# Patient Record
Sex: Male | Born: 1940 | Hispanic: No | Smoking: Never smoker
Health system: Southern US, Community
[De-identification: ages and names within clinical notes are randomized; demographics above are authoritative.]

## PROBLEM LIST (undated history)

## (undated) DIAGNOSIS — F0390 Unspecified dementia without behavioral disturbance: Secondary | ICD-10-CM

## (undated) DIAGNOSIS — N289 Disorder of kidney and ureter, unspecified: Secondary | ICD-10-CM

## (undated) HISTORY — PX: OTHER SURGICAL HISTORY: SHX169

---

## 2016-12-01 ENCOUNTER — Other Ambulatory Visit: Payer: Self-pay | Admitting: Family Medicine

## 2016-12-01 DIAGNOSIS — R413 Other amnesia: Secondary | ICD-10-CM

## 2018-11-02 ENCOUNTER — Emergency Department (HOSPITAL_COMMUNITY): Payer: Medicare Other

## 2018-11-02 ENCOUNTER — Other Ambulatory Visit: Payer: Self-pay

## 2018-11-02 ENCOUNTER — Encounter (HOSPITAL_COMMUNITY): Payer: Self-pay

## 2018-11-02 ENCOUNTER — Inpatient Hospital Stay (HOSPITAL_COMMUNITY)
Admission: EM | Admit: 2018-11-02 | Discharge: 2018-11-07 | DRG: 418 | Disposition: A | Discharge status: Home / Self Care | Payer: Medicare Other | Attending: Surgery | Admitting: Surgery

## 2018-11-02 DIAGNOSIS — K819 Cholecystitis, unspecified: Secondary | ICD-10-CM

## 2018-11-02 DIAGNOSIS — R1011 Right upper quadrant pain: Secondary | ICD-10-CM

## 2018-11-02 DIAGNOSIS — K8000 Calculus of gallbladder with acute cholecystitis without obstruction: Secondary | ICD-10-CM | POA: Diagnosis not present

## 2018-11-02 DIAGNOSIS — K8010 Calculus of gallbladder with chronic cholecystitis without obstruction: Secondary | ICD-10-CM | POA: Diagnosis present

## 2018-11-02 DIAGNOSIS — R109 Unspecified abdominal pain: Secondary | ICD-10-CM

## 2018-11-02 DIAGNOSIS — R945 Abnormal results of liver function studies: Secondary | ICD-10-CM

## 2018-11-02 DIAGNOSIS — K810 Acute cholecystitis: Secondary | ICD-10-CM | POA: Diagnosis not present

## 2018-11-02 DIAGNOSIS — Z720 Tobacco use: Secondary | ICD-10-CM

## 2018-11-02 DIAGNOSIS — R7989 Other specified abnormal findings of blood chemistry: Secondary | ICD-10-CM

## 2018-11-02 DIAGNOSIS — F0390 Unspecified dementia without behavioral disturbance: Secondary | ICD-10-CM | POA: Diagnosis present

## 2018-11-02 DIAGNOSIS — N179 Acute kidney failure, unspecified: Secondary | ICD-10-CM | POA: Diagnosis present

## 2018-11-02 DIAGNOSIS — R339 Retention of urine, unspecified: Secondary | ICD-10-CM | POA: Diagnosis present

## 2018-11-02 HISTORY — DX: Disorder of kidney and ureter, unspecified: N28.9

## 2018-11-02 HISTORY — DX: Unspecified dementia, unspecified severity, without behavioral disturbance, psychotic disturbance, mood disturbance, and anxiety: F03.90

## 2018-11-02 LAB — COMPREHENSIVE METABOLIC PANEL
ALT: 172 U/L — AB (ref 0–44)
AST: 124 U/L — ABNORMAL HIGH (ref 15–41)
Albumin: 4 g/dL (ref 3.5–5.0)
Alkaline Phosphatase: 226 U/L — ABNORMAL HIGH (ref 38–126)
Anion gap: 11 (ref 5–15)
BUN: 20 mg/dL (ref 8–23)
CO2: 21 mmol/L — AB (ref 22–32)
Calcium: 9.1 mg/dL (ref 8.9–10.3)
Chloride: 103 mmol/L (ref 98–111)
Creatinine, Ser: 1.18 mg/dL (ref 0.61–1.24)
GFR calc Af Amer: >60 mL/min (ref >60)
GFR calc non Af Amer: 59 mL/min — ABNORMAL LOW (ref >60)
Glucose, Bld: 144 mg/dL — ABNORMAL HIGH (ref 70–99)
Potassium: 3.8 mmol/L (ref 3.5–5.1)
SODIUM: 135 mmol/L (ref 135–145)
Total Bilirubin: 3.7 mg/dL — ABNORMAL HIGH (ref 0.3–1.2)
Total Protein: 7.3 g/dL (ref 6.5–8.1)

## 2018-11-02 LAB — URINALYSIS, ROUTINE W REFLEX MICROSCOPIC
Bilirubin Urine: NEGATIVE
GLUCOSE, UA: 50 mg/dL — AB
Ketones, ur: NEGATIVE mg/dL
LEUKOCYTE UA: NEGATIVE
Nitrite: NEGATIVE
Protein, ur: 30 mg/dL — AB
Specific Gravity, Urine: 1.026 (ref 1.005–1.030)
pH: 5 (ref 5.0–8.0)

## 2018-11-02 LAB — CBC
HCT: 45.8 % (ref 39.0–52.0)
Hemoglobin: 16.3 g/dL (ref 13.0–17.0)
MCH: 33.4 pg (ref 26.0–34.0)
MCHC: 35.6 g/dL (ref 30.0–36.0)
MCV: 93.9 fL (ref 80.0–100.0)
Platelets: 162 10*3/uL (ref 150–400)
RBC: 4.88 MIL/uL (ref 4.22–5.81)
RDW: 13.7 % (ref 11.5–15.5)
WBC: 16.8 10*3/uL — ABNORMAL HIGH (ref 4.0–10.5)
nRBC: 0 % (ref 0.0–0.2)

## 2018-11-02 LAB — LIPASE, BLOOD: LIPASE: 23 U/L (ref 11–51)

## 2018-11-02 MED ORDER — IOHEXOL 300 MG/ML  SOLN
100.0000 mL | Freq: Once | INTRAMUSCULAR | Status: AC | PRN
  Administered 2018-11-02: 100 mL via INTRAVENOUS

## 2018-11-02 MED ORDER — SODIUM CHLORIDE 0.9% FLUSH
3.0000 mL | Freq: Once | INTRAVENOUS | Status: AC
  Administered 2018-11-03: 3 mL via INTRAVENOUS

## 2018-11-02 NOTE — H&P (Signed)
William Bishop is an 78 y.o. male.   Chief Complaint: abdominal pain HPI: The patient is a 78 year old white male who presents to the emergency department with complaints of abdominal pain.  The family says this is been going on for several days.  The family states that he has run a fever to 101.4 at home.  He has had several episodes of nausea and vomiting.  He complains of right upper quadrant pain.  He came to the emergency department where an ultrasound did show stones in the gallbladder as well as some gallbladder wall thickening.  His liver functions were significantly elevated.  He states that he has never had surgery before  Past Medical History:  Diagnosis Date  . Dementia (HCC)   . Renal disorder     Past Surgical History:  Procedure Laterality Date  . arm fracture surgery      Family History  Family history unknown: Yes   Social History:  reports that he has never smoked. His smokeless tobacco use includes chew. He reports that he does not drink alcohol or use drugs.  Allergies: No Known Allergies  (Not in a hospital admission)   Results for orders placed or performed during the hospital encounter of 11/02/18 (from the past 48 hour(s))  Urinalysis, Routine w reflex microscopic     Status: Abnormal   Collection Time: 11/02/18  7:32 PM  Result Value Ref Range   Color, Urine AMBER (A) YELLOW    Comment: BIOCHEMICALS MAY BE AFFECTED BY COLOR   APPearance CLEAR CLEAR   Specific Gravity, Urine 1.026 1.005 - 1.030   pH 5.0 5.0 - 8.0   Glucose, UA 50 (A) NEGATIVE mg/dL   Hgb urine dipstick SMALL (A) NEGATIVE   Bilirubin Urine NEGATIVE NEGATIVE   Ketones, ur NEGATIVE NEGATIVE mg/dL   Protein, ur 30 (A) NEGATIVE mg/dL   Nitrite NEGATIVE NEGATIVE   Leukocytes,Ua NEGATIVE NEGATIVE    Comment: Performed at [facility], [address] [place], [place] [address]  Lipase, blood     Status: None   Collection Time: 11/02/18  8:14 PM  Result Value Ref Range   Lipase 23 11 - 51 U/L    Comment: Performed at [facility], [address], [place], [place] [address]  Comprehensive metabolic panel     Status: Abnormal   Collection Time: 11/02/18  8:14 PM  Result Value Ref Range   Sodium 135 135 - 145 mmol/L   Potassium 3.8 3.5 - 5.1 mmol/L   Chloride 103 98 - 111 mmol/L   CO2 21 (L) 22 - 32 mmol/L   Glucose, Bld 144 (H) 70 - 99 mg/dL   BUN 20 8 - 23 mg/dL   Creatinine, Ser 6.28 0.61 - 1.24 mg/dL   Calcium 9.1 8.9 - 31.5 mg/dL   Total Protein 7.3 6.5 - 8.1 g/dL   Albumin 4.0 3.5 - 5.0 g/dL   AST 176 (H) 15 - 41 U/L   ALT 172 (H) 0 - 44 U/L   Alkaline Phosphatase 226 (H) 38 - 126 U/L   Total Bilirubin 3.7 (H) 0.3 - 1.2 mg/dL   GFR calc non Af Amer 59 (L) >60 mL/min   GFR calc Af Amer >60 >60 mL/min   Anion gap 11 5 - 15    Comment: Performed at [facility], [address], [place], [place] [address]  CBC     Status: Abnormal   Collection Time: 11/02/18  8:14 PM  Result Value Ref Range   WBC 16.8 (H) 4.0 - 10.5 K/uL   RBC 4.88 4.22 - 5.81 MIL/uL   Hemoglobin 16.3 13.0 - 17.0 g/dL   HCT 24.8 18.5 - 90.9 %   MCV 93.9 80.0 - 100.0 fL   MCH 33.4 26.0 - 34.0 pg   MCHC 35.6 30.0 - 36.0 g/dL   RDW 31.1 21.6 - 24.4 %   Platelets 162 150 - 400 K/uL   nRBC 0.0 0.0 - 0.2 %    Comment: Performed at [facility], [address], [place], [place] [address]   Ct Abdomen Pelvis W Contrast  Result Date: 11/02/2018 CLINICAL DATA:  Right upper quadrant abdominal pain. EXAM: CT ABDOMEN AND PELVIS WITH CONTRAST TECHNIQUE: Multidetector CT imaging of the abdomen and pelvis was performed using the standard protocol following bolus administration of intravenous contrast. CONTRAST:  OMNIPAQUE IOHEXOL 300 MG/ML  SOLN COMPARISON:  None. FINDINGS: Lower chest: Subpleural 7 mm nodule in the right middle lobe, image 12 series 4. 5 mm perifissural subpleural nodule in the included right upper lobe image 4  series 4. 5 mm subpleural nodule in the right lower lobe, image 17 series 4. motion artifact partially limits evaluation of the lung bases. There is dependent atelectasis. Questionable mild bilateral pleural thickening. Mild cardiomegaly. Hepatobiliary: Distended gallbladder with indistinct wall and pericholecystic stranding. No calcified gallstone. No biliary dilatation. Punctate hepatic granuloma in the right lobe. No focal lesion. Pancreas: No ductal dilatation or inflammation. Spleen: Normal in size without focal abnormality. Adrenals/Urinary Tract: Normal adrenal glands. No hydronephrosis. Trace bilateral perinephric edema appears symmetric. Multiple clustered nonobstructing stones in the lower right kidney measuring 11 mm in total. Multiple bilateral renal cysts. Ureters are decompressed without stone along the course. Urinary bladder is partially distended. Mild bladder wall thickening. Stomach/Bowel: Small hiatal hernia. Stomach physiologically distended. Small duodenal diverticulum. No small bowel inflammation or obstruction. Mild edema about the hepatic flexure of the colon felt to be secondary to gallbladder inflammation, no associated colonic wall thickening. Mild diverticulosis of the distal colon without acute diverticulitis. Appendix not definitively visualized. Vascular/Lymphatic: Aortic atherosclerosis and tortuosity. No aortic aneurysm. No enlarged lymph nodes in the abdomen or pelvis. Portal vein and mesenteric vessels are patent. Reproductive: Enlarged prostate gland spanning 5.9 cm transverse. Other: Pericholecystic stranding with trace fluid. No ascites elsewhere. No free air. No intra-abdominal abscess. Small fat containing umbilical hernia. Minimal fat in the inguinal canals. Musculoskeletal: Degenerative change in the lumbar spine. There are no acute or suspicious osseous abnormalities. IMPRESSION: 1. Findings suspicious for acute cholecystitis. No calcified gallstone. 2. Nonobstructing  right nephrolithiasis. Bilateral renal cysts. 3. Enlarged prostate gland. Mild bladder wall thickening likely secondary to chronic bladder outlet obstruction. 4. Multiple pulmonary nodules at the right lung base, largest measuring 7 mm. Non-contrast chest CT at 3-6 months is recommended. If the nodules are stable at time of repeat CT, then future CT at 18-24 months (from today's scan) is considered optional for low-risk patients, but is recommended for high-risk patients. This recommendation follows the consensus statement: Guidelines for Management of Incidental Pulmonary Nodules Detected on CT Images: From the Fleischner Society 2017; Radiology 2017; 284:228-243. 5.  Aortic Atherosclerosis (ICD10-I70.0). Electronically Signed   By: Narda Rutherford M.D.   On: 11/02/2018 22:49    Review of Systems  Constitutional: Positive for fever.  HENT: Negative.   Eyes: Negative.   Respiratory: Negative.   Cardiovascular: Negative.   Gastrointestinal: Positive for abdominal pain, nausea and vomiting.  Genitourinary: Negative.   Musculoskeletal: Negative.   Skin: Negative.   Neurological: Negative.   Endo/Heme/Allergies: Negative.   Psychiatric/Behavioral: Positive for memory loss.    Blood pressure (!) 145/73, pulse 91, temperature 98.1 F (36.7 C), temperature source Oral, resp. rate 18, height 5\' 7"  (1.702 m), weight 97.5 kg, SpO2 93 %. Physical Exam  Constitutional: He appears well-developed and well-nourished. No distress.  HENT:  Head: Normocephalic and atraumatic.  Mouth/Throat: No oropharyngeal exudate.  Eyes: Pupils are equal, round, and reactive to light. Conjunctivae and EOM are normal.  Neck: Normal range of motion. Neck supple.  Cardiovascular: Normal rate, regular rhythm and normal heart sounds.  Respiratory: Effort normal and breath sounds normal. No stridor. No respiratory distress.  GI: Soft.  There is moderate tenderness in the RUQ  Musculoskeletal: Normal range of motion.         General: No tenderness or edema.  Neurological: He is alert.  Answers questions appropriately although family says there is some dementia  Skin: Skin is warm and dry. No erythema.  Psychiatric: He has a normal mood and affect. His behavior is normal. Thought content normal.     Assessment/Plan The patient appears to have cholecystitis with cholelithiasis and some significant elevation of his liver function test.  At this point I would plan to admit him to the hospital and start him on broad-spectrum antibiotic therapy.  We will start him on bowel rest as well.  I will recheck his liver functions in the morning.  If they are still elevated then we will consult gastroenterology for possible ERCP.  I talked to him and his family about possible MRCP and they do not feel he will sit still long enough for the study.  He may benefit from removal of the gallbladder during this hospitalization  Chevis PrettyPaul Toth III, MD 11/02/2018, 11:57 PM

## 2018-11-02 NOTE — ED Triage Notes (Signed)
Patient c/o RUQ pain 2 days ago, no pain yesterday, but today he began having mid and RUQ pain with fever today.

## 2018-11-02 NOTE — ED Provider Notes (Signed)
Emergency Department Provider Note   I have reviewed the triage vital signs and the nursing notes.   HISTORY  Chief Complaint Abdominal Pain   HPI William Bishop is a 78 y.o. male with PMH of dementia presents to the ED with RUQ abdominal pain and fever. No CP, SOB, or cough.  The patient's family member provides most of the history with the patient's dementia.  Level 5 caveat applies.  Symptoms began 2 days ago and have gradually worsened.  She measured a temperature of 101 F yesterday evening.  No vomiting or diarrhea.  Patient states she is unsure if he may have fallen during the start of this as he had been out in the barn and then returned complaining of abdominal pain and wet jeans.   Past Medical History:  Diagnosis Date   Dementia (HCC)    Renal disorder     There are no active problems to display for this patient.   Past Surgical History:  Procedure Laterality Date   arm fracture surgery      Allergies Patient has no known allergies.  Family History  Family history unknown: Yes    Social History Social History   Tobacco Use   Smoking status: Never Smoker   Smokeless tobacco: Current User    Types: Chew  Substance Use Topics   Alcohol use: Never    Frequency: Never   Drug use: Never    Review of Systems  Constitutional: Positive fever/chills Eyes: No visual changes. ENT: No sore throat. Cardiovascular: Denies chest pain. Respiratory: Denies shortness of breath. Gastrointestinal: Positive RUQ abdominal pain.  No nausea, no vomiting.  No diarrhea.  No constipation. Genitourinary: Negative for dysuria. Musculoskeletal: Negative for back pain. Skin: Negative for rash. Neurological: Negative for headaches, focal weakness or numbness.  10-point ROS otherwise negative.  ____________________________________________   PHYSICAL EXAM:  VITAL SIGNS: ED Triage Vitals  Enc Vitals Group     BP 11/02/18 1656 126/89     Pulse Rate 11/02/18  1656 (!) 118     Resp 11/02/18 1656 20     Temp 11/02/18 1656 98.1 F (36.7 C)     Temp Source 11/02/18 1656 Oral     SpO2 11/02/18 1656 98 %     Weight 11/02/18 1705 215 lb (97.5 kg)     Height 11/02/18 1705 5\' 7"  (1.702 m)     Pain Score 11/02/18 1703 5   Constitutional: Alert and oriented. Well appearing and in no acute distress. Eyes: Conjunctivae are normal. Head: Atraumatic. Nose: No congestion/rhinnorhea. Mouth/Throat: Mucous membranes are moist.  Neck: No stridor.  Cardiovascular: Tachycardia. Good peripheral circulation. Grossly normal heart sounds.   Respiratory: Normal respiratory effort. No retractions. Lungs CTAB. Gastrointestinal: Soft with focal RUQ tenderness. No rebound or guarding. No distention.  Musculoskeletal: No lower extremity tenderness nor edema. No gross deformities of extremities. Neurologic:  Normal speech and language. No gross focal neurologic deficits are appreciated.  Skin:  Skin is warm, dry and intact. No rash noted.  ____________________________________________   LABS (all labs ordered are listed, but only abnormal results are displayed)  Labs Reviewed  COMPREHENSIVE METABOLIC PANEL - Abnormal; Notable for the following components:      Result Value   CO2 21 (*)    Glucose, Bld 144 (*)    AST 124 (*)    ALT 172 (*)    Alkaline Phosphatase 226 (*)    Total Bilirubin 3.7 (*)    GFR calc non Af  Amer 9 (*)    All other components within normal limits  CBC - Abnormal; Notable for the following components:   WBC 16.8 (*)    All other components within normal limits  URINALYSIS, ROUTINE W REFLEX MICROSCOPIC - Abnormal; Notable for the following components:   Color, Urine AMBER (*)    Glucose, UA 50 (*)    Hgb urine dipstick SMALL (*)    Protein, ur 30 (*)    All other components within normal limits  LIPASE, BLOOD   ____________________________________________  RADIOLOGY  Ct Abdomen Pelvis W Contrast  Result Date:  11/02/2018 CLINICAL DATA:  Right upper quadrant abdominal pain. EXAM: CT ABDOMEN AND PELVIS WITH CONTRAST TECHNIQUE: Multidetector CT imaging of the abdomen and pelvis was performed using the standard protocol following bolus administration of intravenous contrast. CONTRAST:  OMNIPAQUE IOHEXOL 300 MG/ML  SOLN COMPARISON:  None. FINDINGS: Lower chest: Subpleural 7 mm nodule in the right middle lobe, image 12 series 4. 5 mm perifissural subpleural nodule in the included right upper lobe image 4 series 4. 5 mm subpleural nodule in the right lower lobe, image 17 series 4. motion artifact partially limits evaluation of the lung bases. There is dependent atelectasis. Questionable mild bilateral pleural thickening. Mild cardiomegaly. Hepatobiliary: Distended gallbladder with indistinct wall and pericholecystic stranding. No calcified gallstone. No biliary dilatation. Punctate hepatic granuloma in the right lobe. No focal lesion. Pancreas: No ductal dilatation or inflammation. Spleen: Normal in size without focal abnormality. Adrenals/Urinary Tract: Normal adrenal glands. No hydronephrosis. Trace bilateral perinephric edema appears symmetric. Multiple clustered nonobstructing stones in the lower right kidney measuring 11 mm in total. Multiple bilateral renal cysts. Ureters are decompressed without stone along the course. Urinary bladder is partially distended. Mild bladder wall thickening. Stomach/Bowel: Small hiatal hernia. Stomach physiologically distended. Small duodenal diverticulum. No small bowel inflammation or obstruction. Mild edema about the hepatic flexure of the colon felt to be secondary to gallbladder inflammation, no associated colonic wall thickening. Mild diverticulosis of the distal colon without acute diverticulitis. Appendix not definitively visualized. Vascular/Lymphatic: Aortic atherosclerosis and tortuosity. No aortic aneurysm. No enlarged lymph nodes in the abdomen or pelvis. Portal vein and  mesenteric vessels are patent. Reproductive: Enlarged prostate gland spanning 5.9 cm transverse. Other: Pericholecystic stranding with trace fluid. No ascites elsewhere. No free air. No intra-abdominal abscess. Small fat containing umbilical hernia. Minimal fat in the inguinal canals. Musculoskeletal: Degenerative change in the lumbar spine. There are no acute or suspicious osseous abnormalities. IMPRESSION: 1. Findings suspicious for acute cholecystitis. No calcified gallstone. 2. Nonobstructing right nephrolithiasis. Bilateral renal cysts. 3. Enlarged prostate gland. Mild bladder wall thickening likely secondary to chronic bladder outlet obstruction. 4. Multiple pulmonary nodules at the right lung base, largest measuring 7 mm. Non-contrast chest CT at 3-6 months is recommended. If the nodules are stable at time of repeat CT, then future CT at 18-24 months (from today's scan) is considered optional for low-risk patients, but is recommended for high-risk patients. This recommendation follows the consensus statement: Guidelines for Management of Incidental Pulmonary Nodules Detected on CT Images: From the Fleischner Society 2017; Radiology 2017; 284:228-243. 5.  Aortic Atherosclerosis (ICD10-I70.0). Electronically Signed   By: Narda Rutherford M.D.   On: 11/02/2018 22:49    ____________________________________________   PROCEDURES  Procedure(s) performed:   Procedures  CRITICAL CARE Performed by: Maia Plan Total critical care time: 35 minutes Critical care time was exclusive of separately billable procedures and treating other patients. Critical care was necessary to treat  or prevent imminent or life-threatening deterioration. Critical care was time spent personally by me on the following activities: development of treatment plan with patient and/or surrogate as well as nursing, discussions with consultants, evaluation of patient's response to treatment, examination of patient, obtaining history  from patient or surrogate, ordering and performing treatments and interventions, ordering and review of laboratory studies, ordering and review of radiographic studies, pulse oximetry and re-evaluation of patient's condition.  Alona Bene, MD Emergency Medicine  ____________________________________________   INITIAL IMPRESSION / ASSESSMENT AND PLAN / ED COURSE  Pertinent labs & imaging results that were available during my care of the patient were reviewed by me and considered in my medical decision making (see chart for details).   Patient presents to the emergency department with reported fever at home and right upper quadrant abdominal pain.  He does have tenderness in the right upper quadrant without rebound or guarding.  No fever here.  No tachycardia.  No respiratory symptoms.  CT reviewed which shows acute cholecystitis. Labs reviewed. Will consult surgery.   The patient is noted to have a leukocytosis and hyperbilirubinemia. With the current information available to me, I don't think the patient is in sepsis. Abnormal labs are related to acute cholecystitis and not sepsis.   Discussed patient's case with Surgery to request admission. Patient and family (if present) updated with plan. Care transferred to Surgery service.  I reviewed all nursing notes, vitals, pertinent old records, EKGs, labs, imaging (as available).  ____________________________________________  FINAL CLINICAL IMPRESSION(S) / ED DIAGNOSES  Final diagnoses:  Right upper quadrant abdominal pain  Cholecystitis, acute    MEDICATIONS GIVEN DURING THIS VISIT:  Medications  sodium chloride flush (NS) 0.9 % injection 3 mL (has no administration in time range)  piperacillin-tazobactam (ZOSYN) IVPB 3.375 g (has no administration in time range)  iohexol (OMNIPAQUE) 300 MG/ML solution 100 mL (100 mLs Intravenous Contrast Given 11/02/18 2203)    Note:  This document was prepared using Dragon voice recognition  software and may include unintentional dictation errors.  Alona Bene, MD Emergency Medicine    Devere Brem, Arlyss Repress, MD 11/03/18 [id number]

## 2018-11-03 ENCOUNTER — Inpatient Hospital Stay (HOSPITAL_COMMUNITY): Payer: Medicare Other

## 2018-11-03 DIAGNOSIS — R339 Retention of urine, unspecified: Secondary | ICD-10-CM | POA: Diagnosis present

## 2018-11-03 DIAGNOSIS — Z720 Tobacco use: Secondary | ICD-10-CM | POA: Diagnosis not present

## 2018-11-03 DIAGNOSIS — N179 Acute kidney failure, unspecified: Secondary | ICD-10-CM | POA: Diagnosis present

## 2018-11-03 DIAGNOSIS — K8010 Calculus of gallbladder with chronic cholecystitis without obstruction: Secondary | ICD-10-CM | POA: Diagnosis present

## 2018-11-03 DIAGNOSIS — K8000 Calculus of gallbladder with acute cholecystitis without obstruction: Secondary | ICD-10-CM | POA: Diagnosis present

## 2018-11-03 DIAGNOSIS — F0390 Unspecified dementia without behavioral disturbance: Secondary | ICD-10-CM | POA: Diagnosis present

## 2018-11-03 DIAGNOSIS — K810 Acute cholecystitis: Secondary | ICD-10-CM | POA: Diagnosis present

## 2018-11-03 DIAGNOSIS — R748 Abnormal levels of other serum enzymes: Secondary | ICD-10-CM

## 2018-11-03 LAB — COMPREHENSIVE METABOLIC PANEL
ALT: 139 U/L — ABNORMAL HIGH (ref 0–44)
ANION GAP: 10 (ref 5–15)
AST: 87 U/L — ABNORMAL HIGH (ref 15–41)
Albumin: 3.8 g/dL (ref 3.5–5.0)
Alkaline Phosphatase: 196 U/L — ABNORMAL HIGH (ref 38–126)
BUN: 18 mg/dL (ref 8–23)
CHLORIDE: 102 mmol/L (ref 98–111)
CO2: 23 mmol/L (ref 22–32)
Calcium: 9 mg/dL (ref 8.9–10.3)
Creatinine, Ser: 1.13 mg/dL (ref 0.61–1.24)
GFR calc Af Amer: >60 mL/min (ref >60)
Glucose, Bld: 152 mg/dL — ABNORMAL HIGH (ref 70–99)
Potassium: 3.8 mmol/L (ref 3.5–5.1)
Sodium: 135 mmol/L (ref 135–145)
Total Bilirubin: 4.2 mg/dL — ABNORMAL HIGH (ref 0.3–1.2)
Total Protein: 6.9 g/dL (ref 6.5–8.1)

## 2018-11-03 LAB — CBC
HCT: 45.9 % (ref 39.0–52.0)
Hemoglobin: 15.8 g/dL (ref 13.0–17.0)
MCH: 33.3 pg (ref 26.0–34.0)
MCHC: 34.4 g/dL (ref 30.0–36.0)
MCV: 96.8 fL (ref 80.0–100.0)
Platelets: 157 10*3/uL (ref 150–400)
RBC: 4.74 MIL/uL (ref 4.22–5.81)
RDW: 13.6 % (ref 11.5–15.5)
WBC: 19.2 10*3/uL — ABNORMAL HIGH (ref 4.0–10.5)
nRBC: 0 % (ref 0.0–0.2)

## 2018-11-03 MED ORDER — KCL IN DEXTROSE-NACL 20-5-0.9 MEQ/L-%-% IV SOLN
INTRAVENOUS | Status: DC
  Administered 2018-11-03 – 2018-11-06 (×5): via INTRAVENOUS
  Filled 2018-11-03 (×9): qty 1000

## 2018-11-03 MED ORDER — PIPERACILLIN-TAZOBACTAM 3.375 G IVPB
3.3750 g | Freq: Three times a day (TID) | INTRAVENOUS | Status: DC
  Administered 2018-11-03 – 2018-11-07 (×13): 3.375 g via INTRAVENOUS
  Filled 2018-11-03 (×15): qty 50

## 2018-11-03 MED ORDER — PANTOPRAZOLE SODIUM 40 MG IV SOLR
40.0000 mg | Freq: Every day | INTRAVENOUS | Status: DC
  Administered 2018-11-03 – 2018-11-06 (×5): 40 mg via INTRAVENOUS
  Filled 2018-11-03 (×5): qty 40

## 2018-11-03 MED ORDER — ONDANSETRON HCL 4 MG/2ML IJ SOLN: 4.0000 mg | Freq: Four times a day (QID) | INTRAMUSCULAR | Status: DC | PRN

## 2018-11-03 MED ORDER — PIPERACILLIN-TAZOBACTAM 3.375 G IVPB 30 MIN
3.3750 g | Freq: Once | INTRAVENOUS | Status: AC
  Administered 2018-11-03: 3.375 g via INTRAVENOUS
  Filled 2018-11-03: qty 50

## 2018-11-03 MED ORDER — MORPHINE SULFATE (PF) 2 MG/ML IV SOLN
1.0000 mg | INTRAVENOUS | Status: DC | PRN
  Administered 2018-11-03: 2 mg via INTRAVENOUS
  Filled 2018-11-03 (×2): qty 1

## 2018-11-03 MED ORDER — ONDANSETRON 4 MG PO TBDP: 4.0000 mg | ORAL_TABLET | Freq: Four times a day (QID) | ORAL | Status: DC | PRN

## 2018-11-03 NOTE — Progress Notes (Signed)
Pt remains tachy.

## 2018-11-03 NOTE — Progress Notes (Signed)
Subjective/Chief Complaint: Pt with some confusion this AM   Objective: Vital signs in last 24 hours: Temp:  [98.1 F (36.7 C)-98.5 F (36.9 C)] 98.2 F (36.8 C) (03/28 0512) Pulse Rate:  [88-118] 94 (03/28 0512) Resp:  [18-24] 24 (03/28 0512) BP: (126-160)/(73-92) 154/92 (03/28 0512) SpO2:  [92 %-98 %] 96 % (03/28 0512) Weight:  [97.5 kg] 97.5 kg (03/27 1705) Last BM Date: (PTA)  Intake/Output from previous day: 03/27 0701 - 03/28 0700 In: 44.9 [IV Piggyback:44.9] Out: -  Intake/Output this shift: No intake/output data recorded.  Constitutional: No acute distress, conversant, appears states age. Eyes: Anicteric sclerae, moist conjunctiva, no lid lag Lungs: Clear to auscultation bilaterally, normal respiratory effort CV: regular rate and rhythm, no murmurs, no peripheral edema, pedal pulses 2+ GI: Soft, no masses or hepatosplenomegaly, tender to palpation RUQ Skin: No rashes, palpation reveals normal turgor Psychiatric: confusion likely d/t dementia   Lab Results:  Recent Labs    11/02/18 2014 11/03/18 0428  WBC 16.8* 19.2*  HGB 16.3 15.8  HCT 45.8 45.9  PLT 162 157   BMET Recent Labs    11/02/18 2014 11/03/18 0428  NA 135 135  K 3.8 3.8  CL 103 102  CO2 21* 23  GLUCOSE 144* 152*  BUN 20 18  CREATININE 1.18 1.13  CALCIUM 9.1 9.0   PT/INR No results for input(s): LABPROT, INR in the last 72 hours. ABG No results for input(s): PHART, HCO3 in the last 72 hours.  Invalid input(s): PCO2, PO2  Studies/Results: Ct Abdomen Pelvis W Contrast  Result Date: 11/02/2018 CLINICAL DATA:  Right upper quadrant abdominal pain. EXAM: CT ABDOMEN AND PELVIS WITH CONTRAST TECHNIQUE: Multidetector CT imaging of the abdomen and pelvis was performed using the standard protocol following bolus administration of intravenous contrast. CONTRAST:  OMNIPAQUE IOHEXOL 300 MG/ML  SOLN COMPARISON:  None. FINDINGS: Lower chest: Subpleural 7 mm nodule in the right middle  lobe, image 12 series 4. 5 mm perifissural subpleural nodule in the included right upper lobe image 4 series 4. 5 mm subpleural nodule in the right lower lobe, image 17 series 4. motion artifact partially limits evaluation of the lung bases. There is dependent atelectasis. Questionable mild bilateral pleural thickening. Mild cardiomegaly. Hepatobiliary: Distended gallbladder with indistinct wall and pericholecystic stranding. No calcified gallstone. No biliary dilatation. Punctate hepatic granuloma in the right lobe. No focal lesion. Pancreas: No ductal dilatation or inflammation. Spleen: Normal in size without focal abnormality. Adrenals/Urinary Tract: Normal adrenal glands. No hydronephrosis. Trace bilateral perinephric edema appears symmetric. Multiple clustered nonobstructing stones in the lower right kidney measuring 11 mm in total. Multiple bilateral renal cysts. Ureters are decompressed without stone along the course. Urinary bladder is partially distended. Mild bladder wall thickening. Stomach/Bowel: Small hiatal hernia. Stomach physiologically distended. Small duodenal diverticulum. No small bowel inflammation or obstruction. Mild edema about the hepatic flexure of the colon felt to be secondary to gallbladder inflammation, no associated colonic wall thickening. Mild diverticulosis of the distal colon without acute diverticulitis. Appendix not definitively visualized. Vascular/Lymphatic: Aortic atherosclerosis and tortuosity. No aortic aneurysm. No enlarged lymph nodes in the abdomen or pelvis. Portal vein and mesenteric vessels are patent. Reproductive: Enlarged prostate gland spanning 5.9 cm transverse. Other: Pericholecystic stranding with trace fluid. No ascites elsewhere. No free air. No intra-abdominal abscess. Small fat containing umbilical hernia. Minimal fat in the inguinal canals. Musculoskeletal: Degenerative change in the lumbar spine. There are no acute or suspicious osseous abnormalities.  IMPRESSION: 1. Findings suspicious for  acute cholecystitis. No calcified gallstone. 2. Nonobstructing right nephrolithiasis. Bilateral renal cysts. 3. Enlarged prostate gland. Mild bladder wall thickening likely secondary to chronic bladder outlet obstruction. 4. Multiple pulmonary nodules at the right lung base, largest measuring 7 mm. Non-contrast chest CT at 3-6 months is recommended. If the nodules are stable at time of repeat CT, then future CT at 18-24 months (from today's scan) is considered optional for low-risk patients, but is recommended for high-risk patients. This recommendation follows the consensus statement: Guidelines for Management of Incidental Pulmonary Nodules Detected on CT Images: From the Fleischner Society 2017; Radiology 2017; 284:228-243. 5.  Aortic Atherosclerosis (ICD10-I70.0). Electronically Signed   By: Narda Rutherford M.D.   On: 11/02/2018 22:49    Anti-infectives: Anti-infectives (From admission, onward)   Start     Dose/Rate Route Frequency Ordered Stop   11/03/18 0600  piperacillin-tazobactam (ZOSYN) IVPB 3.375 g     3.375 g 12.5 mL/hr over 240 Minutes Intravenous Every 8 hours 11/03/18 0138     11/03/18 0015  piperacillin-tazobactam (ZOSYN) IVPB 3.375 g     3.375 g 100 mL/hr over 30 Minutes Intravenous  Once 11/03/18 0005 11/03/18 0118      Assessment/Plan: 89 M with acute cholecystitis, possible choledocholithiasis Dementia  1.  Will consult GI for ERCP vs. MRCP d/t increasing TB 2. Con't abx 3.  Will need lap chole prior to DC 4. Con't NPO   LOS: 0 days    Axel Filler 11/03/2018

## 2018-11-03 NOTE — Consult Note (Signed)
Referring Provider: [facility] Surgery Primary Care Physician:  Patient, No Pcp Per Primary Gastroenterologist:  unassigned      Reason for Consultation:  Abnormal liver tests      ASSESSMENT / PLAN:     80. 78 year old relatively healthy male with RUQ pain, fever, nausea / vomiting/ abnormal liver test , leukocytosis and CT can suspicious for acute cholecystitis. Cholelithiasis on U/S in ED per Surgery's note but I couldn't find where one was performed.  -will obtain RUQ U/S today to evaluate for cholelithiasis.  -CBC, liver tests in am -agree with Zosyn  2. Dementia   HPI:      HPI: William Bishop is a 78 y.o. male with dementia who presented to the ED yesterday evening with complaints of RUQ pain, N/V and fever. Liver tests found to be elevated as was WBC. Afebrile in ED but wife reported fever of 101 at home. CTAP with contrast demonstrates distended gallbladder and pericholecystic stranding.  No cholelithiasis or biliary duct dilation. Surgery's admit not mentions cholelithiasis on u/s but I don't see that U/S was done. MRCP was apparently discussed with the family who didn't feel patient could sit still long enough for the procedure.    Data reviewed:  ED workup WBC 16.8 Hct 45.9 Lipase 23 Alk ph 226, AST 124, ALT 172, tbili 4.2  CTAP with contrast -no cholelithiasis, distended gallbladder with indistinct wall and pericholecystic stranding.  No PD dilation, spleen appears normal, small duodenal diverticulum.  Mild edema about the hepatic flexure felt to be reactive to gallbladder inflammation.  No associated colonic wall thickening.  Appendix not definitely visualized.  Multiple pulmonary nodules  Wife provides history. Patient began complaining of chest pain on Wed. Pain eventually moved to diffuse upper abdomen. She found him tender in RUQ. He had associated nausea, vomiting and fever of 101 at home. She describes orange colored urine.  No associated bowel changes.  He generally has no GI complaints. Never seen a [employer].    Past Medical History:  Diagnosis Date   Dementia ([place])    Renal disorder     Past Surgical History:  Procedure Laterality Date   arm fracture surgery      Prior to Admission medications   Medication Sig Start Date End Date Taking? Authorizing Provider  acetaminophen (TYLENOL) 500 MG tablet Take 500 mg by mouth once.   Yes [provider]    Current Facility-Administered Medications  Medication Dose Route Frequency Provider Last Rate Last Dose   dextrose 5 % and 0.9 % NaCl with KCl 20 mEq/L infusion   Intravenous Continuous Autumn Messing III, MD 100 mL/hr at 11/03/18 0300     morphine 2 MG/ML injection 1-4 mg  1-4 mg Intravenous Q1H PRN Autumn Messing III, MD   2 mg at 11/03/18 0221   ondansetron (ZOFRAN-ODT) disintegrating tablet 4 mg  4 mg Oral Q6H PRN Autumn Messing III, MD       Or   ondansetron [facility]) injection 4 mg  4 mg Intravenous Q6H PRN Autumn Messing III, MD       pantoprazole (PROTONIX) injection 40 mg  40 mg Intravenous QHS Autumn Messing III, MD   40 mg at 11/03/18 0229   piperacillin-tazobactam (ZOSYN) IVPB 3.375 g  3.375 g Intravenous Q8H Autumn Messing III, MD 12.5 mL/hr at 11/03/18 0648 3.375 g at 11/03/18 0865    Allergies as of 11/02/2018   (No Known Allergies)    Family History  Family history unknown: Yes  Social History   Socioeconomic History   Marital status: Unknown    Spouse name: Not on file   Number of children: Not on file   Years of education: Not on file   Highest education level: Not on file  Occupational History   Not on file  Social Needs   Financial resource strain: Not on file   Food insecurity:    Worry: Not on file    Inability: Not on file   Transportation needs:    Medical: Not on file    Non-medical: Not on file  Tobacco Use   Smoking status: Never Smoker   Smokeless tobacco: Current User    Types: Chew  Substance and Sexual Activity    Alcohol use: Never    Frequency: Never   Drug use: Never   Sexual activity: Not on file  Lifestyle   Physical activity:    Days per week: Not on file    Minutes per session: Not on file   Stress: Not on file  Relationships   Social connections:    Talks on phone: Not on file    Gets together: Not on file    Attends religious service: Not on file    Active member of club or organization: Not on file    Attends meetings of clubs or organizations: Not on file    Relationship status: Not on file   Intimate partner violence:    Fear of current or ex partner: Not on file    Emotionally abused: Not on file    Physically abused: Not on file    Forced sexual activity: Not on file  Other Topics Concern   Not on file  Social History Narrative   Not on file    Review of Systems: Not obtained. Patient has dementia  Physical Exam: Vital signs in last 24 hours: Temp:  [98.1 F (36.7 C)-98.8 F (37.1 C)] 98.8 F (37.1 C) (03/28 0903) Pulse Rate:  [88-131] 108 (03/28 0929) Resp:  [18-24] 22 (03/28 0903) BP: (124-160)/(73-92) 124/91 (03/28 0903) SpO2:  [92 %-98 %] 95 % (03/28 0903) Weight:  [97.5 kg] 97.5 kg (03/27 1705) Last BM Date: (PTA) General:   Alert, well-developed,  White male in NAD Psych:  Pleasant, cooperative. Eyes:  Pupils equal, sclera clear, mild icterus.   Ears:  Normal auditory acuity. Nose:  No deformity, discharge,  or lesions. Neck:  Supple; no masses Lungs:  Clear throughout to auscultation.   No wheezes, crackles, or rhonchi.  Heart:  Regular rate and rhythm; no murmurs, no lower extremity edema Abdomen:  Soft, non-distended, mild epigastric / RUQ tenderness. BS active, no palp mass    Rectal:  Deferred  Msk:  Symmetrical without gross deformities. . Neurologic:  Alert and  oriented x4;  grossly normal neurologically. Skin:  Intact without significant lesions or rashes.   Intake/Output from previous day: 03/27 0701 - 03/28 0700 In: 44.9 [IV  Piggyback:44.9] Out: -  Intake/Output this shift: No intake/output data recorded.  Lab Results: Recent Labs    11/02/18 2014 11/03/18 0428  WBC 16.8* 19.2*  HGB 16.3 15.8  HCT 45.8 45.9  PLT 162 157   BMET Recent Labs    11/02/18 2014 11/03/18 0428  NA 135 135  K 3.8 3.8  CL 103 102  CO2 21* 23  GLUCOSE 144* 152*  BUN 20 18  CREATININE 1.18 1.13  CALCIUM 9.1 9.0   LFT Recent Labs    11/03/18 0428  PROT 6.9  ALBUMIN 3.8  AST 87*  ALT 139*  ALKPHOS 196*  BILITOT 4.2*     Studies/Results: Ct Abdomen Pelvis W Contrast  Result Date: 11/02/2018 CLINICAL DATA:  Right upper quadrant abdominal pain. EXAM: CT ABDOMEN AND PELVIS WITH CONTRAST TECHNIQUE: Multidetector CT imaging of the abdomen and pelvis was performed using the standard protocol following bolus administration of intravenous contrast. CONTRAST:  163m OMNIPAQUE IOHEXOL 300 MG/ML  SOLN COMPARISON:  None. FINDINGS: Lower chest: Subpleural 7 mm nodule in the right middle lobe, image 12 series 4. 5 mm perifissural subpleural nodule in the included right upper lobe image 4 series 4. 5 mm subpleural nodule in the right lower lobe, image 17 series 4. motion artifact partially limits evaluation of the lung bases. There is dependent atelectasis. Questionable mild bilateral pleural thickening. Mild cardiomegaly. Hepatobiliary: Distended gallbladder with indistinct wall and pericholecystic stranding. No calcified gallstone. No biliary dilatation. Punctate hepatic granuloma in the right lobe. No focal lesion. Pancreas: No ductal dilatation or inflammation. Spleen: Normal in size without focal abnormality. Adrenals/Urinary Tract: Normal adrenal glands. No hydronephrosis. Trace bilateral perinephric edema appears symmetric. Multiple clustered nonobstructing stones in the lower right kidney measuring 11 mm in total. Multiple bilateral renal cysts. Ureters are decompressed without stone along the course. Urinary bladder is partially  distended. Mild bladder wall thickening. Stomach/Bowel: Small hiatal hernia. Stomach physiologically distended. Small duodenal diverticulum. No small bowel inflammation or obstruction. Mild edema about the hepatic flexure of the colon felt to be secondary to gallbladder inflammation, no associated colonic wall thickening. Mild diverticulosis of the distal colon without acute diverticulitis. Appendix not definitively visualized. Vascular/Lymphatic: Aortic atherosclerosis and tortuosity. No aortic aneurysm. No enlarged lymph nodes in the abdomen or pelvis. Portal vein and mesenteric vessels are patent. Reproductive: Enlarged prostate gland spanning 5.9 cm transverse. Other: Pericholecystic stranding with trace fluid. No ascites elsewhere. No free air. No intra-abdominal abscess. Small fat containing umbilical hernia. Minimal fat in the inguinal canals. Musculoskeletal: Degenerative change in the lumbar spine. There are no acute or suspicious osseous abnormalities. IMPRESSION: 1. Findings suspicious for acute cholecystitis. No calcified gallstone. 2. Nonobstructing right nephrolithiasis. Bilateral renal cysts. 3. Enlarged prostate gland. Mild bladder wall thickening likely secondary to chronic bladder outlet obstruction. 4. Multiple pulmonary nodules at the right lung base, largest measuring 7 mm. Non-contrast chest CT at 3-6 months is recommended. If the nodules are stable at time of repeat CT, then future CT at 18-24 months (from today's scan) is considered optional for low-risk patients, but is recommended for high-risk patients. This recommendation follows the consensus statement: Guidelines for Management of Incidental Pulmonary Nodules Detected on CT Images: From the Fleischner Society 2017; Radiology 2017; 284:228-243. 5.  Aortic Atherosclerosis (ICD10-I70.0). Electronically Signed   By: MKeith RakeM.D.   On: 11/02/2018 22:49     PTye Savoy NP-C @  11/03/2018, 9:34 AM

## 2018-11-03 NOTE — ED Provider Notes (Signed)
Assuming care of patient from Dr. Jacqulyn Bath.   Patient in the ED for abd pain. Workup thus far shows CT evidence of cholecystitis.  Concerning findings are as following : None Important pending results are none  According to Dr. Jacqulyn Bath, plan is to have Surgery admit.   Patient had no complains, no concerns from the nursing side. Will continue to monitor.  According to assessment from Dr. Jacqulyn Bath, patient has elevated bilirubin because of cholecystitis and not sepsis.   William Kaplan, MD 11/03/18 [phone]

## 2018-11-03 NOTE — Progress Notes (Signed)
Per hospital policy, at this time, spouse of pt has been asked to leave facility and left about 1100 this morning. Pt is combative at times, noncompliant and constantly trying to get OOB. Since spouse has left, pt is impossible to keep in bed.  Have applied mitts bilateral to hands and having staff member in room almost constantly. Spouse suggested that the morphine the pt received last night possibly got him more agitated. Continuing to monitor closely.

## 2018-11-04 LAB — CBC
HEMATOCRIT: 42.3 % (ref 39.0–52.0)
Hemoglobin: 15 g/dL (ref 13.0–17.0)
MCH: 33.6 pg (ref 26.0–34.0)
MCHC: 35.5 g/dL (ref 30.0–36.0)
MCV: 94.6 fL (ref 80.0–100.0)
NRBC: 0 % (ref 0.0–0.2)
Platelets: 136 10*3/uL — ABNORMAL LOW (ref 150–400)
RBC: 4.47 MIL/uL (ref 4.22–5.81)
RDW: 13.6 % (ref 11.5–15.5)
WBC: 14.3 10*3/uL — ABNORMAL HIGH (ref 4.0–10.5)

## 2018-11-04 LAB — HEPATIC FUNCTION PANEL
ALT: 87 U/L — ABNORMAL HIGH (ref 0–44)
AST: 53 U/L — AB (ref 15–41)
Albumin: 3.3 g/dL — ABNORMAL LOW (ref 3.5–5.0)
Alkaline Phosphatase: 212 U/L — ABNORMAL HIGH (ref 38–126)
Bilirubin, Direct: 2.6 mg/dL — ABNORMAL HIGH (ref 0.0–0.2)
Indirect Bilirubin: 2.5 mg/dL — ABNORMAL HIGH (ref 0.3–0.9)
Total Bilirubin: 5.1 mg/dL — ABNORMAL HIGH (ref 0.3–1.2)
Total Protein: 6.5 g/dL (ref 6.5–8.1)

## 2018-11-04 MED ORDER — LIP MEDEX EX OINT
TOPICAL_OINTMENT | CUTANEOUS | Status: AC
  Filled 2018-11-04: qty 7

## 2018-11-04 NOTE — Progress Notes (Signed)
Pt became more and more confused and agitated this evening. Threatening behavior towards the [employer]. Pt is not easily reoriented (hx dementia) and combative. Paged on-call MD for [facility] Derrell Lolling) and received order for non-violent restraints. Discussed with pt the measures for him to get the restraints off, but there is no evidence of learning. Will continue to monitor.

## 2018-11-04 NOTE — Progress Notes (Signed)
      Gastroenterology Progress Note   Chief Complaint:   Abnormal liver tests / cholelithiasis   SUBJECTIVE:    getting bed bath. Denies abdominal pain   ASSESSMENT AND PLAN:   78 year old relatively healthy male with cholelithiasis / acute cholecystitis. Liver tests elevated, still trying to rule out choledocholithiasis. CBD 7 mm on RUQ u/s yesterday.  No abdominal pain at present.   -Tmax 100.1, WBC better 19 >>> 14. On Zosyn -MRCP was ordered. Hopefully he can cooperate. He has dementia, apparently restless at times.     OBJECTIVE:     Vital signs in last 24 hours: Temp:  [98.2 F (36.8 C)-100.1 F (37.8 C)] 98.3 F (36.8 C) (03/29 0617) Pulse Rate:  [105-114] 112 (03/29 0617) Resp:  [18-20] 18 (03/29 0617) BP: (129-157)/(85-99) 129/85 (03/29 0617) SpO2:  [93 %-97 %] 93 % (03/29 0617) Last BM Date: (PTA) General:   Alert, well-developed male in NAD EENT:  Normal hearing Heart:  Regular rate and rhythm; no murmur.  No lower extremity edema   Pulm: Normal respiratory effort Abdomen:  Soft, nondistended, nontender.  Normal bowel sounds, Psych:  Pleasant, cooperative.     Intake/Output from previous day: 03/28 0701 - 03/29 0700 In: 1200 [I.V.:1200] Out: -  Intake/Output this shift: No intake/output data recorded.  Lab Results: Recent Labs    11/02/18 2014 11/03/18 0428 11/04/18 0533  WBC 16.8* 19.2* 14.3*  HGB 16.3 15.8 15.0  HCT 45.8 45.9 42.3  PLT 162 157 136*   BMET Recent Labs    11/02/18 2014 11/03/18 0428  NA 135 135  K 3.8 3.8  CL 103 102  CO2 21* 23  GLUCOSE 144* 152*  BUN 20 18  CREATININE 1.18 1.13  CALCIUM 9.1 9.0   LFT Recent Labs    11/04/18 0533  PROT 6.5  ALBUMIN 3.3*  AST 53*  ALT 87*  ALKPHOS 212*  BILITOT 5.1*  BILIDIR 2.6*  IBILI 2.5*    US Abdomen Limited Ruq  Result Date: 11/03/2018 CLINICAL DATA:  Right upper quadrant abdominal pain EXAM: ULTRASOUND ABDOMEN LIMITED RIGHT UPPER QUADRANT COMPARISON:   CT abdomen/pelvis dated 11/02/2018 FINDINGS: Gallbladder: Layering gallstones with irregular gallbladder wall thickening and trace pericholecystic fluid. Positive sonographic Murphy's sign. Common bile duct: Diameter: 7 mm Liver: 6 mm hyperechoic lesion in the right hepatic lobe, possibly reflecting a tiny hemangioma, not evident on recent CT. Within normal limits in parenchymal echogenicity. Portal vein is patent on color Doppler imaging with normal direction of blood flow towards the liver. IMPRESSION: Cholelithiasis with irregular gallbladder wall thickening and trace pericholecystic fluid. Positive sonographic Murphy's sign. This appearance is worrisome for early acute cholecystitis, as noted on CT. Electronically Signed   By: Charline Bills M.D.   On: 11/03/2018 11:49     Active Problems:   Cholecystitis with cholelithiasis     LOS: 1 day   Willette Cluster ,NP 11/04/2018, 10:21 AM

## 2018-11-04 NOTE — Progress Notes (Signed)
Subjective/Chief Complaint: Pt with some confusion and combativeness Pt states min pain this AM   Objective: Vital signs in last 24 hours: Temp:  [98.2 F (36.8 C)-100.1 F (37.8 C)] 98.3 F (36.8 C) (03/29 0617) Pulse Rate:  [105-131] 112 (03/29 0617) Resp:  [18-22] 18 (03/29 0617) BP: (124-157)/(85-99) 129/85 (03/29 0617) SpO2:  [93 %-97 %] 93 % (03/29 0617) Last BM Date: (PTA)  Intake/Output from previous day: 03/28 0701 - 03/29 0700 In: 1200 [I.V.:1200] Out: -  Intake/Output this shift: No intake/output data recorded.  Constitutional: No acute distress, conversant, appears states age. Eyes: Anicteric sclerae, moist conjunctiva, no lid lag Lungs: Clear to auscultation bilaterally, normal respiratory effort CV: regular rate and rhythm, no murmurs, no peripheral edema, pedal pulses 2+ GI: Soft, no masses or hepatosplenomegaly, non-tender to palpation Skin: No rashes, palpation reveals normal turgor Psychiatric: confused and combative  Lab Results:  Recent Labs    11/03/18 0428 11/04/18 0533  WBC 19.2* 14.3*  HGB 15.8 15.0  HCT 45.9 42.3  PLT 157 136*   BMET Recent Labs    11/02/18 2014 11/03/18 0428  NA 135 135  K 3.8 3.8  CL 103 102  CO2 21* 23  GLUCOSE 144* 152*  BUN 20 18  CREATININE 1.18 1.13  CALCIUM 9.1 9.0   Hepatic Function Latest Ref Rng & Units 11/04/2018 11/03/2018 11/02/2018  Total Protein 6.5 - 8.1 g/dL 6.5 6.9 7.3  Albumin 3.5 - 5.0 g/dL 3.3(L) 3.8 4.0  AST 15 - 41 U/L 53(H) 87(H) 124(H)  ALT 0 - 44 U/L 87(H) 139(H) 172(H)  Alk Phosphatase 38 - 126 U/L 212(H) 196(H) 226(H)  Total Bilirubin 0.3 - 1.2 mg/dL 5.1(H) 4.2(H) 3.7(H)  Bilirubin, Direct 0.0 - 0.2 mg/dL 2.6(H) - -    Studies/Results: Ct Abdomen Pelvis W Contrast  Result Date: 11/02/2018 CLINICAL DATA:  Right upper quadrant abdominal pain. EXAM: CT ABDOMEN AND PELVIS WITH CONTRAST TECHNIQUE: Multidetector CT imaging of the abdomen and pelvis was performed using the standard  protocol following bolus administration of intravenous contrast. CONTRAST:  110m OMNIPAQUE IOHEXOL 300 MG/ML  SOLN COMPARISON:  None. FINDINGS: Lower chest: Subpleural 7 mm nodule in the right middle lobe, image 12 series 4. 5 mm perifissural subpleural nodule in the included right upper lobe image 4 series 4. 5 mm subpleural nodule in the right lower lobe, image 17 series 4. motion artifact partially limits evaluation of the lung bases. There is dependent atelectasis. Questionable mild bilateral pleural thickening. Mild cardiomegaly. Hepatobiliary: Distended gallbladder with indistinct wall and pericholecystic stranding. No calcified gallstone. No biliary dilatation. Punctate hepatic granuloma in the right lobe. No focal lesion. Pancreas: No ductal dilatation or inflammation. Spleen: Normal in size without focal abnormality. Adrenals/Urinary Tract: Normal adrenal glands. No hydronephrosis. Trace bilateral perinephric edema appears symmetric. Multiple clustered nonobstructing stones in the lower right kidney measuring 11 mm in total. Multiple bilateral renal cysts. Ureters are decompressed without stone along the course. Urinary bladder is partially distended. Mild bladder wall thickening. Stomach/Bowel: Small hiatal hernia. Stomach physiologically distended. Small duodenal diverticulum. No small bowel inflammation or obstruction. Mild edema about the hepatic flexure of the colon felt to be secondary to gallbladder inflammation, no associated colonic wall thickening. Mild diverticulosis of the distal colon without acute diverticulitis. Appendix not definitively visualized. Vascular/Lymphatic: Aortic atherosclerosis and tortuosity. No aortic aneurysm. No enlarged lymph nodes in the abdomen or pelvis. Portal vein and mesenteric vessels are patent. Reproductive: Enlarged prostate gland spanning 5.9 cm transverse. Other: Pericholecystic stranding  with trace fluid. No ascites elsewhere. No free air. No intra-abdominal  abscess. Small fat containing umbilical hernia. Minimal fat in the inguinal canals. Musculoskeletal: Degenerative change in the lumbar spine. There are no acute or suspicious osseous abnormalities. IMPRESSION: 1. Findings suspicious for acute cholecystitis. No calcified gallstone. 2. Nonobstructing right nephrolithiasis. Bilateral renal cysts. 3. Enlarged prostate gland. Mild bladder wall thickening likely secondary to chronic bladder outlet obstruction. 4. Multiple pulmonary nodules at the right lung base, largest measuring 7 mm. Non-contrast chest CT at 3-6 months is recommended. If the nodules are stable at time of repeat CT, then future CT at 18-24 months (from today's scan) is considered optional for low-risk patients, but is recommended for high-risk patients. This recommendation follows the consensus statement: Guidelines for Management of Incidental Pulmonary Nodules Detected on CT Images: From the Fleischner Society 2017; Radiology 2017; 284:228-243. 5.  Aortic Atherosclerosis (ICD10-I70.0). Electronically Signed   By: Keith Rake M.D.   On: 11/02/2018 22:49   US Abdomen Limited Ruq  Result Date: 11/03/2018 CLINICAL DATA:  Right upper quadrant abdominal pain EXAM: ULTRASOUND ABDOMEN LIMITED RIGHT UPPER QUADRANT COMPARISON:  CT abdomen/pelvis dated 11/02/2018 FINDINGS: Gallbladder: Layering gallstones with irregular gallbladder wall thickening and trace pericholecystic fluid. Positive sonographic Murphy's sign. Common bile duct: Diameter: 7 mm Liver: 6 mm hyperechoic lesion in the right hepatic lobe, possibly reflecting a tiny hemangioma, not evident on recent CT. Within normal limits in parenchymal echogenicity. Portal vein is patent on color Doppler imaging with normal direction of blood flow towards the liver. IMPRESSION: Cholelithiasis with irregular gallbladder wall thickening and trace pericholecystic fluid. Positive sonographic Murphy's sign. This appearance is worrisome for early acute  cholecystitis, as noted on CT. Electronically Signed   By: Julian Hy M.D.   On: 11/03/2018 11:49    Anti-infectives: Anti-infectives (From admission, onward)   Start     Dose/Rate Route Frequency Ordered Stop   11/03/18 0600  piperacillin-tazobactam (ZOSYN) IVPB 3.375 g     3.375 g 12.5 mL/hr over 240 Minutes Intravenous Every 8 hours 11/03/18 0138     11/03/18 0015  piperacillin-tazobactam (ZOSYN) IVPB 3.375 g     3.375 g 100 mL/hr over 30 Minutes Intravenous  Once 11/03/18 0005 11/03/18 0118      Assessment/Plan:    22 M with acute cholecystitis, possible choledocholithiasis Dementia  1.  Increasing Tbili.  Will await ERCP vs MRCP per GI 2. Con't abx 3.  Will need lap chole prior to DC 4. Con't NPO   LOS: 1 day    Ralene Ok 11/04/2018   LOS: 1 day    Ralene Ok 11/04/2018

## 2018-11-05 ENCOUNTER — Inpatient Hospital Stay (HOSPITAL_COMMUNITY): Payer: Medicare Other

## 2018-11-05 DIAGNOSIS — K8010 Calculus of gallbladder with chronic cholecystitis without obstruction: Secondary | ICD-10-CM

## 2018-11-05 LAB — CBC
HCT: 41.8 % (ref 39.0–52.0)
Hemoglobin: 14.2 g/dL (ref 13.0–17.0)
MCH: 33.3 pg (ref 26.0–34.0)
MCHC: 34 g/dL (ref 30.0–36.0)
MCV: 98.1 fL (ref 80.0–100.0)
Platelets: 161 10*3/uL (ref 150–400)
RBC: 4.26 MIL/uL (ref 4.22–5.81)
RDW: 13.8 % (ref 11.5–15.5)
WBC: 10.8 10*3/uL — ABNORMAL HIGH (ref 4.0–10.5)
nRBC: 0 % (ref 0.0–0.2)

## 2018-11-05 LAB — COMPREHENSIVE METABOLIC PANEL
ALT: 65 U/L — ABNORMAL HIGH (ref 0–44)
AST: 50 U/L — AB (ref 15–41)
Albumin: 3.2 g/dL — ABNORMAL LOW (ref 3.5–5.0)
Alkaline Phosphatase: 259 U/L — ABNORMAL HIGH (ref 38–126)
Anion gap: 6 (ref 5–15)
BUN: 22 mg/dL (ref 8–23)
CO2: 23 mmol/L (ref 22–32)
Calcium: 8.7 mg/dL — ABNORMAL LOW (ref 8.9–10.3)
Chloride: 113 mmol/L — ABNORMAL HIGH (ref 98–111)
Creatinine, Ser: 1.23 mg/dL (ref 0.61–1.24)
GFR calc Af Amer: >60 mL/min (ref >60)
GFR, EST NON AFRICAN AMERICAN: 56 mL/min — AB (ref >60)
Glucose, Bld: 117 mg/dL — ABNORMAL HIGH (ref 70–99)
POTASSIUM: 4 mmol/L (ref 3.5–5.1)
Sodium: 142 mmol/L (ref 135–145)
TOTAL PROTEIN: 6.6 g/dL (ref 6.5–8.1)
Total Bilirubin: 3.8 mg/dL — ABNORMAL HIGH (ref 0.3–1.2)

## 2018-11-05 MED ORDER — DIPHENHYDRAMINE HCL 25 MG PO CAPS
25.0000 mg | ORAL_CAPSULE | Freq: Four times a day (QID) | ORAL | Status: DC | PRN
  Administered 2018-11-05 – 2018-11-06 (×2): 25 mg via ORAL
  Filled 2018-11-05 (×2): qty 1

## 2018-11-05 MED ORDER — GADOBUTROL 1 MMOL/ML IV SOLN
10.0000 mL | Freq: Once | INTRAVENOUS | Status: AC | PRN
  Administered 2018-11-05: 9 mL via INTRAVENOUS

## 2018-11-05 NOTE — Progress Notes (Addendum)
[facility] Progress Note    Since last GI note: He has not had the MRCP yet. I spoke with MR tech this morning and they plan on MRI soon.  Objective: Vital signs in last 24 hours: Temp:  [98.7 F (37.1 C)-100.5 F (38.1 C)] 99 F (37.2 C) (03/30 0557) Pulse Rate:  [84-96] 96 (03/30 0557) Resp:  [18-22] 18 (03/30 0557) BP: (131-135)/(89-99) 135/90 (03/30 0557) SpO2:  [93 %-96 %] 93 % (03/30 0557) Last BM Date: 11/04/18 General: alert and oriented times 2 (place, self, not sure why he is in the hospital) Heart: regular rate and rythm Abdomen: soft, non-tender, non-distended, normal bowel sounds   Lab Results: Recent Labs    11/03/18 0428 11/04/18 0533 11/05/18 0736  WBC 19.2* 14.3* 10.8*  HGB 15.8 15.0 14.2  PLT 157 136* 161  MCV 96.8 94.6 98.1   Recent Labs    11/02/18 2014 11/03/18 0428 11/05/18 0736  NA 135 135 142  K 3.8 3.8 4.0  CL 103 102 113*  CO2 21* 23 23  GLUCOSE 144* 152* 117*  BUN 20 18 22   CREATININE 1.18 1.13 1.23  CALCIUM 9.1 9.0 8.7*   Recent Labs    11/03/18 0428 11/04/18 0533 11/05/18 0736  PROT 6.9 6.5 6.6  ALBUMIN 3.8 3.3* 3.2*  AST 87* 53* 50*  ALT 139* 87* 65*  ALKPHOS 196* 212* 259*  BILITOT 4.2* 5.1* 3.8*  BILIDIR  --  2.6*  --   IBILI  --  2.5*  --    Studies/Results: US Abdomen Limited Ruq  Result Date: 11/03/2018 CLINICAL DATA:  Right upper quadrant abdominal pain EXAM: ULTRASOUND ABDOMEN LIMITED RIGHT UPPER QUADRANT COMPARISON:  CT abdomen/pelvis dated 11/02/2018 FINDINGS: Gallbladder: Layering gallstones with irregular gallbladder wall thickening and trace pericholecystic fluid. Positive sonographic Murphy's sign. Common bile duct: Diameter: 7 mm Liver: 6 mm hyperechoic lesion in the right hepatic lobe, possibly reflecting a tiny hemangioma, not evident on recent CT. Within normal limits in parenchymal echogenicity. Portal vein is patent on color Doppler imaging with normal direction of blood flow towards the  liver. IMPRESSION: Cholelithiasis with irregular gallbladder wall thickening and trace pericholecystic fluid. Positive sonographic Murphy's sign. This appearance is worrisome for early acute cholecystitis, as noted on CT. Electronically Signed   By: Charline Bills M.D.   On: 11/03/2018 11:49    Medications: Scheduled Meds: . pantoprazole (PROTONIX) IV  40 mg Intravenous QHS   Continuous Infusions: . dextrose 5 % and 0.9 % NaCl with KCl 20 mEq/L 100 mL/hr at 11/04/18 1527  . piperacillin-tazobactam (ZOSYN)  IV 3.375 g (11/05/18 0608)   PRN Meds:.morphine injection, ondansetron **OR** ondansetron (ZOFRAN) IV    Assessment/Plan: 78 y.o. male with acute cholecystitis  LFTs improved bit since yesterday, perhaps a passed stone.  He should be getting his MRCP today, hopefully the image quality will be good enough, to me he seems able to follow simple commands and should tolerate MR.  I spoke with [facility] PA and explained that if MR clearly shows a CBD stone then we'll proceed with ERCP.  If MR is poor quality OR if MR shows no obvious CBD stone then I recommend lap chole with IOC.  I am around all week and can help with ERCP if needed.  Rachael Fee, MD  11/05/2018, 8:15 AM [facility] Pager [phone]

## 2018-11-05 NOTE — Progress Notes (Addendum)
[facility] Progress Note     Subjective: CC: no complaints Patient denies abdominal pain, nausea or vomiting this AM. Sitter at bedside reports patient is passing flatus.   Objective: Vital signs in last 24 hours: Temp:  [98.7 F (37.1 C)-100.5 F (38.1 C)] 99 F (37.2 C) (03/30 0557) Pulse Rate:  [84-96] 96 (03/30 0557) Resp:  [18-22] 18 (03/30 0557) BP: (131-135)/(89-99) 135/90 (03/30 0557) SpO2:  [93 %-96 %] 93 % (03/30 0557) Last BM Date: 11/04/18  Intake/Output from previous day: 03/29 0701 - 03/30 0700 In: 1200 [I.V.:1200] Out: 1750 [Urine:1750] Intake/Output this shift: No intake/output data recorded.  PE: Gen:  Alert, pleasant Card:  Regular rate and rhythm Pulm:  Normal effort, clear to auscultation bilaterally Abd: Soft, non-tender, non-distended, +BS, no HSM Skin: warm and dry, no rashes    Lab Results:  Recent Labs    11/04/18 0533 11/05/18 0736  WBC 14.3* 10.8*  HGB 15.0 14.2  HCT 42.3 41.8  PLT 136* 161   BMET Recent Labs    11/03/18 0428 11/05/18 0736  NA 135 142  K 3.8 4.0  CL 102 113*  CO2 23 23  GLUCOSE 152* 117*  BUN 18 22  CREATININE 1.13 1.23  CALCIUM 9.0 8.7*   PT/INR No results for input(s): LABPROT, INR in the last 72 hours. CMP     Component Value Date/Time   NA 142 11/05/2018 0736   K 4.0 11/05/2018 0736   CL 113 (H) 11/05/2018 0736   CO2 23 11/05/2018 0736   GLUCOSE 117 (H) 11/05/2018 0736   BUN 22 11/05/2018 0736   CREATININE 1.23 11/05/2018 0736   CALCIUM 8.7 (L) 11/05/2018 0736   PROT 6.6 11/05/2018 0736   ALBUMIN 3.2 (L) 11/05/2018 0736   AST 50 (H) 11/05/2018 0736   ALT 65 (H) 11/05/2018 0736   ALKPHOS 259 (H) 11/05/2018 0736   BILITOT 3.8 (H) 11/05/2018 0736   GFRNONAA 56 (L) 11/05/2018 0736   GFRAA >60 11/05/2018 0736   Lipase     Component Value Date/Time   LIPASE 23 11/02/2018 2014       Studies/Results: US Abdomen Limited Ruq  Result Date: 11/03/2018 CLINICAL DATA:  Right  upper quadrant abdominal pain EXAM: ULTRASOUND ABDOMEN LIMITED RIGHT UPPER QUADRANT COMPARISON:  CT abdomen/pelvis dated 11/02/2018 FINDINGS: Gallbladder: Layering gallstones with irregular gallbladder wall thickening and trace pericholecystic fluid. Positive sonographic Murphy's sign. Common bile duct: Diameter: 7 mm Liver: 6 mm hyperechoic lesion in the right hepatic lobe, possibly reflecting a tiny hemangioma, not evident on recent CT. Within normal limits in parenchymal echogenicity. Portal vein is patent on color Doppler imaging with normal direction of blood flow towards the liver. IMPRESSION: Cholelithiasis with irregular gallbladder wall thickening and trace pericholecystic fluid. Positive sonographic Murphy's sign. This appearance is worrisome for early acute cholecystitis, as noted on CT. Electronically Signed   By: Charline Bills M.D.   On: 11/03/2018 11:49    Anti-infectives: Anti-infectives (From admission, onward)   Start     Dose/Rate Route Frequency Ordered Stop   11/03/18 0600  piperacillin-tazobactam (ZOSYN) IVPB 3.375 g     3.375 g 12.5 mL/hr over 240 Minutes Intravenous Every 8 hours 11/03/18 0138     11/03/18 0015  piperacillin-tazobactam (ZOSYN) IVPB 3.375 g     3.375 g 100 mL/hr over 30 Minutes Intravenous  Once 11/03/18 0005 11/03/18 0118       Assessment/Plan Dementia AKI - Cr 1.2 from 1.1, IVF and monitor   Transaminitis  with hyperbilirubinemia Possible choledocholithiasis Possible cholecystitis - AST/ALT and Tbili all trending back down, ALP trending up - plan for possible MRCP today - depending on this may plan for ERCP vs potential lap chole - WBC 10.8, Tmax 100.5, abdominal exam benign  FEN: NPO, IVF VTE: SCDs ID: [email or web address] 3/28>>  LOS: 2 days    Wells Guiles , [facility] 11/05/2018, 8:58 AM Pager: [phone] Consults: [phone]  Agree with above. He is getting MRCP at this time.  Addendum:  MRCP - 11/05/2018 - 1. No  biliary obstruction. Common hepatic duct and common bile duct normal caliber. 2. Findings remain consistent with acute cholecystitis with gallbladder wall thickening and enhancement. Small amount fluid along Morrison's pouch and along the RIGHT pericolic gutter. 3. No evidence of acute pancreatitis.  I spoke to his wife, Fulton Mole, about proceeding with surgery.  I discussed with the wife the indications and risks of gall bladder surgery.  The primary risks of gall bladder surgery include, but are not limited to, bleeding, infection, common bile duct injury, and open surgery.  There is also the risk that the patient may have continued symptoms after surgery.  We discussed the typical post-operative recovery course. I tried to answer the wife's questions.   Ovidio Kin, MD, [facility] Pager: [phone] Office phone:  [phone]

## 2018-11-05 NOTE — Progress Notes (Signed)
MRCP impression 1. No biliary obstruction. Common hepatic duct and common bile duct normal caliber. 2. Findings remain consistent with acute cholecystitis with gallbladder wall thickening and enhancement. Small amount fluid along Morrison's pouch and along the RIGHT pericolic gutter. 3. No evidence of acute pancreatitis.  Likely to proceed with cholecystectomy.  If IOC shows something different than the above, could proceed with ERCP.  Thanks

## 2018-11-06 ENCOUNTER — Encounter (HOSPITAL_COMMUNITY): Admission: EM | Disposition: A | Discharge status: Home / Self Care | Payer: Self-pay | Source: Home / Self Care

## 2018-11-06 ENCOUNTER — Encounter (HOSPITAL_COMMUNITY): Payer: Self-pay | Admitting: Anesthesiology

## 2018-11-06 ENCOUNTER — Inpatient Hospital Stay (HOSPITAL_COMMUNITY): Payer: Medicare Other | Admitting: Anesthesiology

## 2018-11-06 ENCOUNTER — Inpatient Hospital Stay (HOSPITAL_COMMUNITY): Payer: Medicare Other

## 2018-11-06 HISTORY — PX: CHOLECYSTECTOMY: SHX55

## 2018-11-06 LAB — COMPREHENSIVE METABOLIC PANEL
ALT: 55 U/L — ABNORMAL HIGH (ref 0–44)
AST: 36 U/L (ref 15–41)
Albumin: 3 g/dL — ABNORMAL LOW (ref 3.5–5.0)
Alkaline Phosphatase: 291 U/L — ABNORMAL HIGH (ref 38–126)
Anion gap: 8 (ref 5–15)
BUN: 22 mg/dL (ref 8–23)
CO2: 20 mmol/L — ABNORMAL LOW (ref 22–32)
CREATININE: 1.2 mg/dL (ref 0.61–1.24)
Calcium: 8.6 mg/dL — ABNORMAL LOW (ref 8.9–10.3)
Chloride: 113 mmol/L — ABNORMAL HIGH (ref 98–111)
GFR calc Af Amer: >60 mL/min (ref >60)
GFR calc non Af Amer: 58 mL/min — ABNORMAL LOW (ref >60)
Glucose, Bld: 108 mg/dL — ABNORMAL HIGH (ref 70–99)
Potassium: 3.5 mmol/L (ref 3.5–5.1)
Sodium: 141 mmol/L (ref 135–145)
Total Bilirubin: 3 mg/dL — ABNORMAL HIGH (ref 0.3–1.2)
Total Protein: 6.4 g/dL — ABNORMAL LOW (ref 6.5–8.1)

## 2018-11-06 LAB — CBC
HEMATOCRIT: 39.3 % (ref 39.0–52.0)
Hemoglobin: 13.8 g/dL (ref 13.0–17.0)
MCH: 33.7 pg (ref 26.0–34.0)
MCHC: 35.1 g/dL (ref 30.0–36.0)
MCV: 96.1 fL (ref 80.0–100.0)
Platelets: 164 10*3/uL (ref 150–400)
RBC: 4.09 MIL/uL — ABNORMAL LOW (ref 4.22–5.81)
RDW: 14 % (ref 11.5–15.5)
WBC: 11.5 10*3/uL — AB (ref 4.0–10.5)
nRBC: 0 % (ref 0.0–0.2)

## 2018-11-06 SURGERY — LAPAROSCOPIC CHOLECYSTECTOMY WITH INTRAOPERATIVE CHOLANGIOGRAM
Anesthesia: General | Site: Abdomen

## 2018-11-06 MED ORDER — FENTANYL CITRATE (PF) 100 MCG/2ML IJ SOLN
INTRAMUSCULAR | Status: AC
  Filled 2018-11-06: qty 2

## 2018-11-06 MED ORDER — LACTATED RINGERS IV SOLN
INTRAVENOUS | Status: AC | PRN
  Administered 2018-11-06: 1000 mL

## 2018-11-06 MED ORDER — BUPIVACAINE-EPINEPHRINE (PF) 0.25% -1:200000 IJ SOLN
INTRAMUSCULAR | Status: AC
  Filled 2018-11-06: qty 30

## 2018-11-06 MED ORDER — KCL IN DEXTROSE-NACL 20-5-0.45 MEQ/L-%-% IV SOLN
INTRAVENOUS | Status: DC
  Administered 2018-11-06 – 2018-11-07 (×2): via INTRAVENOUS
  Filled 2018-11-06 (×2): qty 1000

## 2018-11-06 MED ORDER — SUCCINYLCHOLINE CHLORIDE 200 MG/10ML IV SOSY
PREFILLED_SYRINGE | INTRAVENOUS | Status: AC
  Filled 2018-11-06: qty 10

## 2018-11-06 MED ORDER — ROCURONIUM BROMIDE 100 MG/10ML IV SOLN
INTRAVENOUS | Status: DC | PRN
  Administered 2018-11-06 (×2): 10 mg via INTRAVENOUS
  Administered 2018-11-06: 40 mg via INTRAVENOUS

## 2018-11-06 MED ORDER — MEPERIDINE HCL 50 MG/ML IJ SOLN: 6.2500 mg | INTRAMUSCULAR | Status: DC | PRN

## 2018-11-06 MED ORDER — PROPOFOL 10 MG/ML IV BOLUS
INTRAVENOUS | Status: DC | PRN
  Administered 2018-11-06: 130 mg via INTRAVENOUS

## 2018-11-06 MED ORDER — FENTANYL CITRATE (PF) 250 MCG/5ML IJ SOLN
INTRAMUSCULAR | Status: AC
  Filled 2018-11-06: qty 5

## 2018-11-06 MED ORDER — SODIUM CHLORIDE 0.9 % IV SOLN
INTRAVENOUS | Status: DC | PRN
  Administered 2018-11-06: 10 mL

## 2018-11-06 MED ORDER — ONDANSETRON HCL 4 MG/2ML IJ SOLN
INTRAMUSCULAR | Status: DC | PRN
  Administered 2018-11-06: 4 mg via INTRAVENOUS

## 2018-11-06 MED ORDER — FENTANYL CITRATE (PF) 100 MCG/2ML IJ SOLN
25.0000 ug | INTRAMUSCULAR | Status: DC | PRN
  Administered 2018-11-06: 50 ug via INTRAVENOUS

## 2018-11-06 MED ORDER — LIDOCAINE 2% (20 MG/ML) 5 ML SYRINGE
INTRAMUSCULAR | Status: DC | PRN
  Administered 2018-11-06: 100 mg via INTRAVENOUS

## 2018-11-06 MED ORDER — PROPOFOL 10 MG/ML IV BOLUS
INTRAVENOUS | Status: AC
  Filled 2018-11-06: qty 20

## 2018-11-06 MED ORDER — ACETAMINOPHEN 325 MG PO TABS
650.0000 mg | ORAL_TABLET | Freq: Three times a day (TID) | ORAL | Status: DC
  Administered 2018-11-06: 650 mg via ORAL
  Filled 2018-11-06 (×2): qty 2

## 2018-11-06 MED ORDER — DEXAMETHASONE SODIUM PHOSPHATE 10 MG/ML IJ SOLN
INTRAMUSCULAR | Status: DC | PRN
  Administered 2018-11-06: 10 mg via INTRAVENOUS

## 2018-11-06 MED ORDER — LACTATED RINGERS IV SOLN
INTRAVENOUS | Status: DC
  Administered 2018-11-06 (×2): via INTRAVENOUS

## 2018-11-06 MED ORDER — PHENYLEPHRINE 40 MCG/ML (10ML) SYRINGE FOR IV PUSH (FOR BLOOD PRESSURE SUPPORT)
PREFILLED_SYRINGE | INTRAVENOUS | Status: DC | PRN
  Administered 2018-11-06 (×2): 80 ug via INTRAVENOUS

## 2018-11-06 MED ORDER — SUGAMMADEX SODIUM 200 MG/2ML IV SOLN
INTRAVENOUS | Status: DC | PRN
  Administered 2018-11-06: 200 mg via INTRAVENOUS

## 2018-11-06 MED ORDER — SUGAMMADEX SODIUM 200 MG/2ML IV SOLN
INTRAVENOUS | Status: AC
  Filled 2018-11-06: qty 2

## 2018-11-06 MED ORDER — SUCCINYLCHOLINE CHLORIDE 200 MG/10ML IV SOSY
PREFILLED_SYRINGE | INTRAVENOUS | Status: DC | PRN
  Administered 2018-11-06: 120 mg via INTRAVENOUS

## 2018-11-06 MED ORDER — TRAMADOL HCL 50 MG PO TABS: 100.0000 mg | ORAL_TABLET | Freq: Two times a day (BID) | ORAL | Status: DC | PRN

## 2018-11-06 MED ORDER — FENTANYL CITRATE (PF) 250 MCG/5ML IJ SOLN
INTRAMUSCULAR | Status: DC | PRN
  Administered 2018-11-06: 100 ug via INTRAVENOUS
  Administered 2018-11-06 (×2): 50 ug via INTRAVENOUS
  Administered 2018-11-06 (×2): 25 ug via INTRAVENOUS

## 2018-11-06 MED ORDER — LIDOCAINE 2% (20 MG/ML) 5 ML SYRINGE
INTRAMUSCULAR | Status: AC
  Filled 2018-11-06: qty 5

## 2018-11-06 MED ORDER — MORPHINE SULFATE (PF) 2 MG/ML IV SOLN: 1.0000 mg | INTRAVENOUS | Status: DC | PRN

## 2018-11-06 MED ORDER — DEXAMETHASONE SODIUM PHOSPHATE 10 MG/ML IJ SOLN
INTRAMUSCULAR | Status: AC
  Filled 2018-11-06: qty 1

## 2018-11-06 MED ORDER — ONDANSETRON HCL 4 MG/2ML IJ SOLN
INTRAMUSCULAR | Status: AC
  Filled 2018-11-06: qty 2

## 2018-11-06 MED ORDER — ROCURONIUM BROMIDE 100 MG/10ML IV SOLN
INTRAVENOUS | Status: AC
  Filled 2018-11-06: qty 1

## 2018-11-06 MED ORDER — 0.9 % SODIUM CHLORIDE (POUR BTL) OPTIME
TOPICAL | Status: DC | PRN
  Administered 2018-11-06: 1000 mL

## 2018-11-06 SURGICAL SUPPLY — 47 items
ADH SKN CLS APL DERMABOND .7 (GAUZE/BANDAGES/DRESSINGS) ×1
APL PRP STRL LF DISP 70% ISPRP (MISCELLANEOUS) ×1
APPLIER CLIP 5 13 M/L LIGAMAX5 (MISCELLANEOUS)
APPLIER CLIP ROT 10 11.4 M/L (STAPLE) ×3
APR CLP MED LRG 11.4X10 (STAPLE) ×1
APR CLP MED LRG 5 ANG JAW (MISCELLANEOUS)
BAG SPEC RTRVL 10 TROC 200 (ENDOMECHANICALS) ×1
CABLE HIGH FREQUENCY MONO STRZ (ELECTRODE) ×3 IMPLANT
CHLORAPREP W/TINT 26 (MISCELLANEOUS) ×3 IMPLANT
CHOLANGIOGRAM CATH TAUT (CATHETERS) ×3 IMPLANT
CLIP APPLIE 5 13 M/L LIGAMAX5 (MISCELLANEOUS) IMPLANT
CLIP APPLIE ROT 10 11.4 M/L (STAPLE) IMPLANT
CLOSURE WOUND 1/4X4 (GAUZE/BANDAGES/DRESSINGS)
COVER MAYO STAND STRL (DRAPES) ×3 IMPLANT
COVER SURGICAL LIGHT HANDLE (MISCELLANEOUS) ×3 IMPLANT
COVER WAND RF STERILE (DRAPES) IMPLANT
DECANTER SPIKE VIAL GLASS SM (MISCELLANEOUS) ×3 IMPLANT
DERMABOND ADVANCED (GAUZE/BANDAGES/DRESSINGS) ×2
DERMABOND ADVANCED .7 DNX12 (GAUZE/BANDAGES/DRESSINGS) ×1 IMPLANT
DRAIN CHANNEL 19F RND (DRAIN) ×2 IMPLANT
DRAPE C-ARM 42X120 X-RAY (DRAPES) ×3 IMPLANT
ELECT REM PT RETURN 15FT ADLT (MISCELLANEOUS) ×3 IMPLANT
EVACUATOR SILICONE 100CC (DRAIN) ×2 IMPLANT
GLOVE SURG SIGNA 7.5 PF LTX (GLOVE) ×3 IMPLANT
GOWN STRL REUS W/TWL XL LVL3 (GOWN DISPOSABLE) ×9 IMPLANT
HEMOSTAT SNOW SURGICEL 2X4 (HEMOSTASIS) ×2 IMPLANT
HEMOSTAT SURGICEL 4X8 (HEMOSTASIS) IMPLANT
IV CATH 14GX2 1/4 (CATHETERS) ×3 IMPLANT
IV SET EXTENSION CATH 6 NF (IV SETS) ×3 IMPLANT
KIT BASIN OR (CUSTOM PROCEDURE TRAY) ×3 IMPLANT
KIT TURNOVER KIT A (KITS) IMPLANT
POUCH RETRIEVAL ECOSAC 10 (ENDOMECHANICALS) ×1 IMPLANT
POUCH RETRIEVAL ECOSAC 10MM (ENDOMECHANICALS) ×2
SCISSORS LAP 5X35 DISP (ENDOMECHANICALS) ×3 IMPLANT
SET IRRIG TUBING LAPAROSCOPIC (IRRIGATION / IRRIGATOR) ×3 IMPLANT
SET TUBE SMOKE EVAC HIGH FLOW (TUBING) ×3 IMPLANT
SLEEVE ADV FIXATION 5X100MM (TROCAR) ×3 IMPLANT
STOPCOCK 4 WAY LG BORE MALE ST (IV SETS) ×3 IMPLANT
STRIP CLOSURE SKIN 1/4X4 (GAUZE/BANDAGES/DRESSINGS) IMPLANT
SUT MNCRL AB 4-0 PS2 18 (SUTURE) ×3 IMPLANT
SYR 10ML ECCENTRIC (SYRINGE) ×3 IMPLANT
TOWEL OR 17X26 10 PK STRL BLUE (TOWEL DISPOSABLE) ×3 IMPLANT
TOWEL OR NON WOVEN STRL DISP B (DISPOSABLE) ×3 IMPLANT
TRAY LAPAROSCOPIC (CUSTOM PROCEDURE TRAY) ×3 IMPLANT
TROCAR ADV FIXATION 11X100MM (TROCAR) ×2 IMPLANT
TROCAR ADV FIXATION 5X100MM (TROCAR) ×5 IMPLANT
TROCAR XCEL BLUNT TIP 100MML (ENDOMECHANICALS) ×3 IMPLANT

## 2018-11-06 NOTE — Anesthesia Postprocedure Evaluation (Signed)
Anesthesia Post Note  Patient: William Bishop  Procedure(s) Performed: LAPAROSCOPIC CHOLECYSTECTOMY WITH INTRAOPERATIVE CHOLANGIOGRAM (N/A Abdomen)     Patient location during evaluation: PACU Anesthesia Type: General Level of consciousness: awake and alert Pain management: pain level controlled Vital Signs Assessment: post-procedure vital signs reviewed and stable Respiratory status: spontaneous breathing, nonlabored ventilation, respiratory function stable and patient connected to nasal cannula oxygen Cardiovascular status: blood pressure returned to baseline and stable Postop Assessment: no apparent nausea or vomiting Anesthetic complications: no    Last Vitals:  Vitals:   11/06/18 1245 11/06/18 1300  BP: 137/86   Pulse: 88   Resp: 15 20  Temp:    SpO2: 93% 93%    Last Pain:  Vitals:   11/06/18 1245  TempSrc:   PainSc: Asleep                 Markitta Ausburn S

## 2018-11-06 NOTE — Progress Notes (Addendum)
[facility] Progress Note  Day of Surgery  Subjective: CC: wants to go home Patient wants to get home. Denies abdominal pain. Denies nausea. Pleasantly demented  Objective: Vital signs in last 24 hours: Temp:  [97.4 F (36.3 C)-97.9 F (36.6 C)] 97.4 F (36.3 C) (03/31 0533) Pulse Rate:  [86-95] 90 (03/31 0533) Resp:  [18-20] 18 (03/31 0533) BP: (125-136)/(78-87) 136/83 (03/31 0533) SpO2:  [95 %-96 %] 95 % (03/31 0533) Last BM Date: 11/04/18  Intake/Output from previous day: 03/30 0701 - 03/31 0700 In: 3143.4 [I.V.:2643.6; IV Piggyback:499.8] Out: 750 [Urine:750] Intake/Output this shift: No intake/output data recorded.  PE: Gen:  Alert, pleasant Card:  Regular rate and rhythm Pulm:  Normal effort, clear to auscultation bilaterally Abd: Soft, non-tender, non-distended, +BS, no HSM Skin: warm and dry, no rashes   Lab Results:  Recent Labs    11/05/18 0736 11/06/18 0447  WBC 10.8* 11.5*  HGB 14.2 13.8  HCT 41.8 39.3  PLT 161 164   BMET Recent Labs    11/05/18 0736 11/06/18 0447  NA 142 141  K 4.0 3.5  CL 113* 113*  CO2 23 20*  GLUCOSE 117* 108*  BUN 22 22  CREATININE 1.23 1.20  CALCIUM 8.7* 8.6*   PT/INR No results for input(s): LABPROT, INR in the last 72 hours. CMP     Component Value Date/Time   NA 141 11/06/2018 0447   K 3.5 11/06/2018 0447   CL 113 (H) 11/06/2018 0447   CO2 20 (L) 11/06/2018 0447   GLUCOSE 108 (H) 11/06/2018 0447   BUN 22 11/06/2018 0447   CREATININE 1.20 11/06/2018 0447   CALCIUM 8.6 (L) 11/06/2018 0447   PROT 6.4 (L) 11/06/2018 0447   ALBUMIN 3.0 (L) 11/06/2018 0447   AST 36 11/06/2018 0447   ALT 55 (H) 11/06/2018 0447   ALKPHOS 291 (H) 11/06/2018 0447   BILITOT 3.0 (H) 11/06/2018 0447   GFRNONAA 58 (L) 11/06/2018 0447   GFRAA >60 11/06/2018 0447   Lipase     Component Value Date/Time   LIPASE 23 11/02/2018 2014       Studies/Results: Mr 3d Recon At Scanner  Result Date: 11/05/2018 CLINICAL  DATA:  Suspected cholecystitis on recent CT and ultrasound. Increasing bilirubin level, with concern for common bile duct obstruction EXAM: MRI ABDOMEN WITHOUT AND WITH CONTRAST (INCLUDING MRCP) TECHNIQUE: Multiplanar multisequence MR imaging of the abdomen was performed both before and after the administration of intravenous contrast. Heavily T2-weighted images of the biliary and pancreatic ducts were obtained, and three-dimensional MRCP images were rendered by post processing. CONTRAST:  9 mL Gadavist COMPARISON:  CT 11/02/2018, ultrasound 11/03/2018 FINDINGS: Lower chest:  Lung bases are clear. Hepatobiliary: Gallbladder is mildly distended to 4.7 cm. Mild gallbladder wall thickening present. Several small gallstones collect in the neck of the gallbladder. There is fluid in Morrison's pouch (image 35/5. Fluid along the RIGHT hepatic margin inferior to the gallbladder. Small amount of fluid tracks along the RIGHT pericolic gutter and anterior RIGHT pararenal space The common hepatic duct and common bile duct are normal caliber with the common bile duct measuring 2 mm. No intrahepatic duct dilatation. No focal hepatic parenchymal lesion. On delayed imaging there is enhancement of the gallbladder wall (image 41/1104) Pancreas: No pancreatic inflammation or obstruction. Spleen: Normal spleen. Adrenals/urinary tract: Multiple nonenhancing renal lesions consistent benign cysts Stomach/Bowel: Stomach and limited of the small bowel is unremarkable Vascular/Lymphatic: Abdominal aortic normal caliber. No retroperitoneal periportal lymphadenopathy. Musculoskeletal: No aggressive osseous lesion  IMPRESSION: 1. No biliary obstruction. Common hepatic duct and common bile duct normal caliber. 2. Findings remain consistent with acute cholecystitis with gallbladder wall thickening and enhancement. Small amount fluid along Morrison's pouch and along the RIGHT pericolic gutter. 3. No evidence of acute pancreatitis. Electronically  Signed   By: Genevive Bi M.D.   On: 11/05/2018 13:34   Mr Abdomen Mrcp Vivien Rossetti Contast  Result Date: 11/05/2018 CLINICAL DATA:  Suspected cholecystitis on recent CT and ultrasound. Increasing bilirubin level, with concern for common bile duct obstruction EXAM: MRI ABDOMEN WITHOUT AND WITH CONTRAST (INCLUDING MRCP) TECHNIQUE: Multiplanar multisequence MR imaging of the abdomen was performed both before and after the administration of intravenous contrast. Heavily T2-weighted images of the biliary and pancreatic ducts were obtained, and three-dimensional MRCP images were rendered by post processing. CONTRAST:  9 mL Gadavist COMPARISON:  CT 11/02/2018, ultrasound 11/03/2018 FINDINGS: Lower chest:  Lung bases are clear. Hepatobiliary: Gallbladder is mildly distended to 4.7 cm. Mild gallbladder wall thickening present. Several small gallstones collect in the neck of the gallbladder. There is fluid in Morrison's pouch (image 35/5. Fluid along the RIGHT hepatic margin inferior to the gallbladder. Small amount of fluid tracks along the RIGHT pericolic gutter and anterior RIGHT pararenal space The common hepatic duct and common bile duct are normal caliber with the common bile duct measuring 2 mm. No intrahepatic duct dilatation. No focal hepatic parenchymal lesion. On delayed imaging there is enhancement of the gallbladder wall (image 41/1104) Pancreas: No pancreatic inflammation or obstruction. Spleen: Normal spleen. Adrenals/urinary tract: Multiple nonenhancing renal lesions consistent benign cysts Stomach/Bowel: Stomach and limited of the small bowel is unremarkable Vascular/Lymphatic: Abdominal aortic normal caliber. No retroperitoneal periportal lymphadenopathy. Musculoskeletal: No aggressive osseous lesion IMPRESSION: 1. No biliary obstruction. Common hepatic duct and common bile duct normal caliber. 2. Findings remain consistent with acute cholecystitis with gallbladder wall thickening and enhancement. Small  amount fluid along Morrison's pouch and along the RIGHT pericolic gutter. 3. No evidence of acute pancreatitis. Electronically Signed   By: Genevive Bi M.D.   On: 11/05/2018 13:34    Anti-infectives: Anti-infectives (From admission, onward)   Start     Dose/Rate Route Frequency Ordered Stop   11/03/18 0600  piperacillin-tazobactam (ZOSYN) IVPB 3.375 g     3.375 g 12.5 mL/hr over 240 Minutes Intravenous Every 8 hours 11/03/18 0138     11/03/18 0015  piperacillin-tazobactam (ZOSYN) IVPB 3.375 g     3.375 g 100 mL/hr over 30 Minutes Intravenous  Once 11/03/18 0005 11/03/18 0118       Assessment/Plan Dementia AKI - Cr 1.2 from 1.1, IVF and monitor   Transaminitis with hyperbilirubinemia Possible choledocholithiasis - MRCP negative yesterday, suspect patient likely passed a stone Possible cholecystitis - AST/ALT and Tbili all trending back down, ALP trending up - WBC 11.5, afeb, abdominal exam benign - to OR today for lap chole with intraoperative cholangiogram  FEN: NPO, IVF VTE: SCDs ID: [email or web address] 3/28>>  LOS: 3 days    Wells Guiles , [facility] 11/06/2018, 8:43 AM Pager: [phone] Consults: [phone]  [place] virus days There must be two notes - so I have already edited one note.  Ovidio Kin, MD, [facility] Pager: [phone] Office phone:  [phone]

## 2018-11-06 NOTE — TOC Progression Note (Signed)
Transition of Care [facility]) - Progression Note    Patient Details  Name: William Bishop MRN: [id number] Date of Birth: 07-23-1941  Transition of Care [facility]) CM/SW Contact  Coralyn Helling, [place] Phone Number: 11/06/2018, 12:40 PM  Clinical Narrative:     Per request of family. LCSW spoke with patients spouse regarding POA paperwork. LCSW left POA paperwork in patients room and explained process to spouse.    LCSW signing off. No CSW needs.     Expected Discharge Plan and Services                                     Social Determinants of Health (SDOH) Interventions    Readmission Risk Interventions No flowsheet data found.

## 2018-11-06 NOTE — Anesthesia Procedure Notes (Signed)
Procedure Name: Intubation Date/Time: 11/06/2018 10:43 AM Performed by: Florene Route, CRNA Patient Re-evaluated:Patient Re-evaluated prior to induction Oxygen Delivery Method: Circle system utilized Preoxygenation: Pre-oxygenation with 100% oxygen Induction Type: IV induction and Rapid sequence Laryngoscope Size: Miller and 3 Grade View: Grade I Tube type: Oral Tube size: 8.0 mm Number of attempts: 1 Airway Equipment and Method: Stylet Placement Confirmation: ETT inserted through vocal cords under direct vision,  positive ETCO2 and breath sounds checked- equal and bilateral Secured at: 23 cm Tube secured with: Tape Dental Injury: Teeth and Oropharynx as per pre-operative assessment

## 2018-11-06 NOTE — Transfer of Care (Signed)
Immediate Anesthesia Transfer of Care Note  Patient: William Bishop  Procedure(s) Performed: LAPAROSCOPIC CHOLECYSTECTOMY WITH INTRAOPERATIVE CHOLANGIOGRAM (N/A Abdomen)  Patient Location: PACU  Anesthesia Type:General  Level of Consciousness: awake  Airway & Oxygen Therapy: Patient Spontanous Breathing and Patient connected to face mask oxygen  Post-op Assessment: Report given to RN and Post -op Vital signs reviewed and stable  Post vital signs: Reviewed and stable  Last Vitals:  Vitals Value Taken Time  BP 129/89 11/06/2018 12:32 PM  Temp    Pulse 92 11/06/2018 12:34 PM  Resp 20 11/06/2018 12:34 PM  SpO2 98 % 11/06/2018 12:34 PM  Vitals shown include unvalidated device data.  Last Pain:  Vitals:   11/06/18 0937  TempSrc: Oral  PainSc:          Complications: No apparent anesthesia complications

## 2018-11-06 NOTE — Anesthesia Preprocedure Evaluation (Addendum)
Anesthesia Evaluation  Patient identified by MRN, date of birth, ID band Patient awake    Reviewed: Allergy & Precautions, H&P , NPO status , Patient's Chart, lab work & pertinent test results  Airway Mallampati: II  TM Distance: >3 FB Neck ROM: Full    Dental no notable dental hx.    Pulmonary neg pulmonary ROS,    Pulmonary exam normal breath sounds clear to auscultation       Cardiovascular Exercise Tolerance: Good negative cardio ROS Normal cardiovascular exam Rhythm:Regular Rate:Normal     Neuro/Psych PSYCHIATRIC DISORDERS Dementia negative neurological ROS     GI/Hepatic negative GI ROS, Neg liver ROS,   Endo/Other  negative endocrine ROS  Renal/GU Renal diseasenegative Renal ROS  negative genitourinary   Musculoskeletal negative musculoskeletal ROS (+)   Abdominal   Peds negative pediatric ROS (+)  Hematology negative hematology ROS (+)   Anesthesia Other Findings   Reproductive/Obstetrics negative OB ROS                            Anesthesia Physical Anesthesia Plan  ASA: III  Anesthesia Plan: General   Post-op Pain Management:    Induction: Intravenous  PONV Risk Score and Plan: 2 and Treatment may vary due to age or medical condition and Ondansetron  Airway Management Planned: Oral ETT  Additional Equipment:   Intra-op Plan:   Post-operative Plan: Extubation in OR  Informed Consent: I have reviewed the patients History and Physical, chart, labs and discussed the procedure including the risks, benefits and alternatives for the proposed anesthesia with the patient or authorized representative who has indicated his/her understanding and acceptance.     Dental advisory given  Plan Discussed with: CRNA, Anesthesiologist and Surgeon  Anesthesia Plan Comments: (  )       Anesthesia Quick Evaluation

## 2018-11-06 NOTE — Op Note (Signed)
11/06/2018  12:27 PM  PATIENT:  TONEY TOW, 78 y.o., male, MRN: [id number]  PREOP DIAGNOSIS:  gallstones  POSTOP DIAGNOSIS:   Acute cholecystitis with necrotic gall bladder, cholelithiasis.  Urinary retention.  PROCEDURE:   Procedure(s):  LAPAROSCOPIC CHOLECYSTECTOMY WITH INTRAOPERATIVE CHOLANGIOGRAM ( 5 trocars)  SURGEON:   Ovidio Kin, M.D.  Threasa HeadsFredonia Highland, M.D.  ANESTHESIA:   general  Anesthesiologist: Eilene Ghazi, MD CRNA: Kizzie Fantasia, CRNA; Florene Route, CRNA  General  ASA: 3E  EBL:  200  ml  BLOOD ADMINISTERED: none  DRAINS: 19 F Blake drain  LOCAL MEDICATIONS USED:   30 cc 1/4% marcaine  SPECIMEN:   Gall bladder  COUNTS CORRECT:  YES  INDICATIONS FOR PROCEDURE:  POE RINIKER is a 78 y.o. (DOB: 1941/03/07) white male whose primary care physician is Patient, No Pcp Per and comes for cholecystectomy.   The indications and risks of the gall bladder surgery were explained to the patient.  The risks include, but are not limited to, infection, bleeding, common bile duct injury and open surgery.  SURGERY:  The patient was taken to OR room #1 at [facility].  The abdomen was prepped with chloroprep.  The patient was on Zosyn prior to the beginning of the operation.   A time out was held and the surgical checklist run.   An infraumbilical incision was made into the abdominal cavity.  A 12 mm Hasson trocar was inserted into the abdominal cavity through the infraumbilical incision and secured with a 0 Vicryl suture.  Three additional trocars were inserted: a 10 mm trocar in the sub-xiphoid location, a 5 mm trocar in the right mid subcostal area, and a 5 mm trocar in the right lateral subcostal area.  An additional trocar was placed between the umbilicus and xiphoid.   The abdomen was explored and the liver, stomach, and bowel that could be seen were unremarkable.   The patient had a very large bladder consistent with chronic urinary  retention.   The gall bladder was encased in omentum.  I stripped the omentum away and this revealed a gall bladder with patches of necrosis.  The gall bladder was chronically inflamed, so much that I could not dissect out the cystic duct.  So I did a "top down" approach to the cholecystectomy.   My dissection carried me down to the cystic duct and cystic artery.   Disssection was carried down to the gall bladder/cystic duct junction and the cystic duct isolated.   An intra-operative cholangiogram was shot.   The intra-operative cholangiogram was shot using a cut off Taut catheter placed through a 14 gauge angiocath in the RUQ.  The Taut catheter was inserted in the cut cystic duct and secured with an endoclip.  A cholangiogram was shot with 8 cc of 1/2 strength Isoview.  Using fluoroscopy, the cholangiogram showed the flow of contrast into the common bile duct, up the hepatic radicals, and into the duodenum.  There was no mass or obstruction.  This was a normal intra-operative cholangiogram.   The Taut catheter was removed.  The cystic duct was tripley endoclipped and the cystic artery was identified and clipped.     After the gall bladder was removed from the liver, the gall bladder bed and Triangle of [place]ot were inspected.  The omentum that I stripped was raw and oozy.  I placed "snow" on the omentum and in the gall bladder fossa.  The gall bladder was placed  in a Ecco Sac bag and delivered through the umbilicus.  The abdomen was irrigated with 3,000 cc saline.   I did place a drain that was brought out through the lateral 5 mm trocar.   The trocars were then removed.  I infiltrated 30cc of 1/4% Marcaine into the incisions.  The umbilical port closed with a 0 Vicryl suture and the skin closed with 4-0 Monocryl.  The skin was painted with DermaBond.  The patient's sponge and needle count were correct.  The patient was transported to the RR in good condition.  Ovidio Kin, MD, [facility]  Surgery Pager: [phone] Office phone:  [phone]

## 2018-11-07 ENCOUNTER — Encounter (HOSPITAL_COMMUNITY): Payer: Self-pay | Admitting: Surgery

## 2018-11-07 LAB — COMPREHENSIVE METABOLIC PANEL
ALT: 53 U/L — ABNORMAL HIGH (ref 0–44)
AST: 37 U/L (ref 15–41)
Albumin: 2.7 g/dL — ABNORMAL LOW (ref 3.5–5.0)
Alkaline Phosphatase: 259 U/L — ABNORMAL HIGH (ref 38–126)
Anion gap: 6 (ref 5–15)
BUN: 19 mg/dL (ref 8–23)
CO2: 23 mmol/L (ref 22–32)
Calcium: 8.3 mg/dL — ABNORMAL LOW (ref 8.9–10.3)
Chloride: 110 mmol/L (ref 98–111)
Creatinine, Ser: 1.16 mg/dL (ref 0.61–1.24)
GFR calc Af Amer: >60 mL/min (ref >60)
GFR, EST NON AFRICAN AMERICAN: 60 mL/min — AB (ref >60)
Glucose, Bld: 132 mg/dL — ABNORMAL HIGH (ref 70–99)
Potassium: 4.4 mmol/L (ref 3.5–5.1)
Sodium: 139 mmol/L (ref 135–145)
Total Bilirubin: 1.9 mg/dL — ABNORMAL HIGH (ref 0.3–1.2)
Total Protein: 5.9 g/dL — ABNORMAL LOW (ref 6.5–8.1)

## 2018-11-07 LAB — CBC WITH DIFFERENTIAL/PLATELET
ABS IMMATURE GRANULOCYTES: 0.06 10*3/uL (ref 0.00–0.07)
BASOS PCT: 0 %
Basophils Absolute: 0 10*3/uL (ref 0.0–0.1)
Eosinophils Absolute: 0 10*3/uL (ref 0.0–0.5)
Eosinophils Relative: 0 %
HCT: 39.2 % (ref 39.0–52.0)
Hemoglobin: 13 g/dL (ref 13.0–17.0)
Immature Granulocytes: 1 %
Lymphocytes Relative: 12 %
Lymphs Abs: 1.2 10*3/uL (ref 0.7–4.0)
MCH: 32.9 pg (ref 26.0–34.0)
MCHC: 33.2 g/dL (ref 30.0–36.0)
MCV: 99.2 fL (ref 80.0–100.0)
Monocytes Absolute: 0.5 10*3/uL (ref 0.1–1.0)
Monocytes Relative: 5 %
NEUTROS PCT: 82 %
Neutro Abs: 8.3 10*3/uL — ABNORMAL HIGH (ref 1.7–7.7)
PLATELETS: 175 10*3/uL (ref 150–400)
RBC: 3.95 MIL/uL — ABNORMAL LOW (ref 4.22–5.81)
RDW: 13.6 % (ref 11.5–15.5)
WBC: 10.1 10*3/uL (ref 4.0–10.5)
nRBC: 0 % (ref 0.0–0.2)

## 2018-11-07 MED ORDER — TRAMADOL HCL 50 MG PO TABS: 50.0000 mg | ORAL_TABLET | Freq: Four times a day (QID) | ORAL | 0 refills | Status: AC | PRN

## 2018-11-07 MED ORDER — AMOXICILLIN-POT CLAVULANATE 875-125 MG PO TABS: 1.0000 | ORAL_TABLET | Freq: Two times a day (BID) | ORAL | 0 refills | Status: AC

## 2018-11-07 NOTE — Care Management Important Message (Signed)
Important Message  Patient Details  Name: William Bishop MRN: [id number] Date of Birth: 05/19/1941   Medicare Important Message Given:  Yes    Caren Macadam 11/07/2018, 11:42 AMImportant Message  Patient Details  Name: William Bishop MRN: [id number] Date of Birth: [birth year]   Medicare Important Message Given:  Yes    Caren Macadam 11/07/2018, 11:42 AM

## 2018-11-07 NOTE — Discharge Summary (Addendum)
[facility] Discharge Summary   Patient ID: William Bishop MRN: [id number] DOB/AGE: 1940-11-28 78 y.o.  Admit date: 11/02/2018 Discharge date: 11/07/2018  Admitting Diagnosis: Acute cholecystitis  Possible choledocholithiasis  Discharge Diagnosis Patient Active Problem List   Diagnosis Date Noted  . Cholecystitis with cholelithiasis 11/03/2018    Consultants Gastroenterology  Imaging: Dg Cholangiogram Operative  Result Date: 11/06/2018 CLINICAL DATA:  Intraoperative cholangiogram during laparoscopic cholecystectomy. EXAM: INTRAOPERATIVE CHOLANGIOGRAM FLUOROSCOPY TIME:  12 seconds (7.77 mGy) COMPARISON:  MRCP-11/05/2018; right upper quadrant abdominal ultrasound-11/03/2018; CT abdomen pelvis-11/02/2018 FINDINGS: Intraoperative cholangiographic images of the right upper abdominal quadrant during laparoscopic cholecystectomy are provided for review. Surgical clips overlie the expected location of the gallbladder fossa. Contrast injection demonstrates selective cannulation of the central aspect of the cystic duct. There is passage of contrast through the central aspect of the cystic duct with filling of a non dilated common bile duct. There is passage of contrast though the CBD and into the descending portion of the duodenum. There is minimal reflux of injected contrast into the common hepatic duct and central aspect of the non dilated intrahepatic biliary system. There are no discrete filling defects within the opacified portions of the biliary system to suggest the presence of choledocholithiasis. IMPRESSION: No evidence of choledocholithiasis. Electronically Signed   By: Simonne Come M.D.   On: 11/06/2018 12:05   Mr 3d Recon At Scanner  Result Date: 11/05/2018 CLINICAL DATA:  Suspected cholecystitis on recent CT and ultrasound. Increasing bilirubin level, with concern for common bile duct obstruction EXAM: MRI ABDOMEN WITHOUT AND WITH CONTRAST (INCLUDING MRCP) TECHNIQUE:  Multiplanar multisequence MR imaging of the abdomen was performed both before and after the administration of intravenous contrast. Heavily T2-weighted images of the biliary and pancreatic ducts were obtained, and three-dimensional MRCP images were rendered by post processing. CONTRAST:  9 mL Gadavist COMPARISON:  CT 11/02/2018, ultrasound 11/03/2018 FINDINGS: Lower chest:  Lung bases are clear. Hepatobiliary: Gallbladder is mildly distended to 4.7 cm. Mild gallbladder wall thickening present. Several small gallstones collect in the neck of the gallbladder. There is fluid in Morrison's pouch (image 35/5. Fluid along the RIGHT hepatic margin inferior to the gallbladder. Small amount of fluid tracks along the RIGHT pericolic gutter and anterior RIGHT pararenal space The common hepatic duct and common bile duct are normal caliber with the common bile duct measuring 2 mm. No intrahepatic duct dilatation. No focal hepatic parenchymal lesion. On delayed imaging there is enhancement of the gallbladder wall (image 41/1104) Pancreas: No pancreatic inflammation or obstruction. Spleen: Normal spleen. Adrenals/urinary tract: Multiple nonenhancing renal lesions consistent benign cysts Stomach/Bowel: Stomach and limited of the small bowel is unremarkable Vascular/Lymphatic: Abdominal aortic normal caliber. No retroperitoneal periportal lymphadenopathy. Musculoskeletal: No aggressive osseous lesion IMPRESSION: 1. No biliary obstruction. Common hepatic duct and common bile duct normal caliber. 2. Findings remain consistent with acute cholecystitis with gallbladder wall thickening and enhancement. Small amount fluid along Morrison's pouch and along the RIGHT pericolic gutter. 3. No evidence of acute pancreatitis. Electronically Signed   By: Genevive Bi M.D.   On: 11/05/2018 13:34   Mr Abdomen Mrcp Vivien Rossetti Contast  Result Date: 11/05/2018 CLINICAL DATA:  Suspected cholecystitis on recent CT and ultrasound. Increasing bilirubin  level, with concern for common bile duct obstruction EXAM: MRI ABDOMEN WITHOUT AND WITH CONTRAST (INCLUDING MRCP) TECHNIQUE: Multiplanar multisequence MR imaging of the abdomen was performed both before and after the administration of intravenous contrast. Heavily T2-weighted images of the biliary and pancreatic ducts were  obtained, and three-dimensional MRCP images were rendered by post processing. CONTRAST:  9 mL Gadavist COMPARISON:  CT 11/02/2018, ultrasound 11/03/2018 FINDINGS: Lower chest:  Lung bases are clear. Hepatobiliary: Gallbladder is mildly distended to 4.7 cm. Mild gallbladder wall thickening present. Several small gallstones collect in the neck of the gallbladder. There is fluid in Morrison's pouch (image 35/5. Fluid along the RIGHT hepatic margin inferior to the gallbladder. Small amount of fluid tracks along the RIGHT pericolic gutter and anterior RIGHT pararenal space The common hepatic duct and common bile duct are normal caliber with the common bile duct measuring 2 mm. No intrahepatic duct dilatation. No focal hepatic parenchymal lesion. On delayed imaging there is enhancement of the gallbladder wall (image 41/1104) Pancreas: No pancreatic inflammation or obstruction. Spleen: Normal spleen. Adrenals/urinary tract: Multiple nonenhancing renal lesions consistent benign cysts Stomach/Bowel: Stomach and limited of the small bowel is unremarkable Vascular/Lymphatic: Abdominal aortic normal caliber. No retroperitoneal periportal lymphadenopathy. Musculoskeletal: No aggressive osseous lesion IMPRESSION: 1. No biliary obstruction. Common hepatic duct and common bile duct normal caliber. 2. Findings remain consistent with acute cholecystitis with gallbladder wall thickening and enhancement. Small amount fluid along Morrison's pouch and along the RIGHT pericolic gutter. 3. No evidence of acute pancreatitis. Electronically Signed   By: Genevive Bi M.D.   On: 11/05/2018 13:34    Procedures Dr.  Ezzard Standing (11/06/18) - Laparoscopic Cholecystectomy with [facility]   Hospital Course:  Patient is a 78 year old male with dementia who presented to [facility] with abdominal pain.  Workup showed cholecystitis with concern for possible choledocholithiasis.  Patient was admitted and gastroenterology consulted. MRCP was negative for choledocholithiasis. Patient taken for cholecystectomy 3/31.  Tolerated procedure well and was transferred to the floor.  Diet was advanced as tolerated.  On POD#1, the patient was voiding well, tolerating diet, ambulating well, pain well controlled, vital signs stable, incisions c/d/i and felt stable for discharge home.  Patient will follow up in our office in 1 weeks and knows to call with questions or concerns.  He will call to confirm appointment date/time.   Patient status and post-operative care discussed with patient's wife over the phone.    Physical Exam: General:  Alert, NAD, pleasant, comfortable Abd:  Soft, ND, mild tenderness, incisions C/D/I, drain with minimal sanguinous drainage   Allergies as of 11/07/2018   No Known Allergies     Medication List    TAKE these medications   acetaminophen 500 MG tablet Commonly known as:  TYLENOL Take 500 mg by mouth once.   amoxicillin-clavulanate 875-125 MG tablet Commonly known as:  Augmentin Take 1 tablet by mouth every 12 (twelve) hours for 7 days.   traMADol 50 MG tablet Commonly known as:  ULTRAM Take 1 tablet (50 mg total) by mouth every 6 (six) hours as needed for severe pain.        Follow-up Information    Surgery, [facility]. Go on 11/14/2018.   Specialty:  General Surgery Why:  Someone from our office will call to give you appointment time for drain removal next week. Please arrive 30 min prior to appointment time. Bring photo ID and insurance information.  Contact information: [address] [place] [address] [phone]           Signed: Wells Guiles, [facility]  Surgery 11/07/2018, 8:06 AM Pager: [phone] Consults: [phone]  [Corona virus days] Agree with above.  Ovidio Kin, MD, [facility] Surgery Pager: [phone] Office phone:  [phone]

## 2018-11-07 NOTE — TOC Transition Note (Signed)
Transition of Care [facility]) - CM/SW Discharge Note   Patient Details  Name: William Bishop MRN: [id number] Date of Birth: 1940/12/03  Transition of Care [facility]) CM/SW Contact:  Golda Acre, RN Phone Number: 11/07/2018, 10:52 AM   Clinical Narrative:    Discharged to home with self-care, orders checked for hhc needs. No TOC needs present at time of discharge.  Patient is able to arrangement own appointments and home care.   Final next level of care: Home/Self Care Barriers to Discharge: No Barriers Identified   Patient Goals and CMS Choice Patient states their goals for this hospitalization and ongoing recovery are:: t go home and get well and see my family CMS Medicare.gov Compare Post Acute Care list provided to:: Patient    Discharge Placement                       Discharge Plan and Services                          Social Determinants of Health (SDOH) Interventions     Readmission Risk Interventions No flowsheet data found.

## 2018-11-07 NOTE — Discharge Instructions (Signed)
[facility]: POST OP INSTRUCTIONS Always review your discharge instruction sheet given to you by the facility where your surgery was performed. IF YOU HAVE DISABILITY OR FAMILY LEAVE FORMS, YOU MUST BRING THEM TO THE OFFICE FOR PROCESSING.   DO NOT GIVE THEM TO YOUR DOCTOR.  PAIN CONTROL  1. First take acetaminophen (Tylenol) AND/or ibuprofen (Advil) to control your pain after surgery.  Follow directions on package.  Taking acetaminophen (Tylenol) and/or ibuprofen (Advil) regularly after surgery will help to control your pain and lower the amount of prescription pain medication you may need.  You should not take more than 3,000 mg (3 grams) of acetaminophen (Tylenol) in 24 hours.  You should not take ibuprofen (Advil), aleve, motrin, naprosyn or other NSAIDS if you have a history of stomach ulcers or chronic kidney disease.  2. A prescription for pain medication may be given to you upon discharge.  Take your pain medication as prescribed, if you still have uncontrolled pain after taking acetaminophen (Tylenol) or ibuprofen (Advil). 3. Use ice packs to help control pain. 4. If you need a refill on your pain medication, please contact your pharmacy.  They will contact our office to request authorization. Prescriptions will not be filled after 5pm or on week-ends.  HOME MEDICATIONS 5. Take your usually prescribed medications unless otherwise directed.  DIET 6. You should follow a light diet the first few days after arrival home.  Be sure to include lots of fluids daily. Avoid fatty, fried foods.   CONSTIPATION 7. It is common to experience some constipation after surgery and if you are taking pain medication.  Increasing fluid intake and taking a stool softener (such as Colace) will usually help or prevent this problem from occurring.  A mild laxative (Milk of Magnesia or Miralax) should be taken according to package instructions if there are no bowel  movements after 48 hours.  WOUND/INCISION CARE 8. Most patients will experience some swelling and bruising in the area of the incisions.  Ice packs will help.  Swelling and bruising can take several days to resolve.  9. Unless discharge instructions indicate otherwise, follow guidelines below  a. STERI-STRIPS - you may remove your outer bandages 48 hours after surgery, and you may shower at that time.  You have steri-strips (small skin tapes) in place directly over the incision.  These strips should be left on the skin for 7-10 days.   b. DERMABOND/SKIN GLUE - you may shower in 24 hours.  The glue will flake off over the next 2-3 weeks. 10. Any sutures or staples will be removed at the office during your follow-up visit.  ACTIVITIES 11. You may resume regular (light) daily activities beginning the next day--such as daily self-care, walking, climbing stairs--gradually increasing activities as tolerated.  You may have sexual intercourse when it is comfortable.  Refrain from any heavy lifting or straining until approved by your doctor. a. You may drive when you are no longer taking prescription pain medication, you can comfortably wear a seatbelt, and you can safely maneuver your car and apply brakes.  FOLLOW-UP 12. You should see your doctor in the office for a follow-up appointment approximately 2-3 weeks after your surgery.  You should have been given your post-op/follow-up appointment when your surgery was scheduled.  If you did not receive a post-op/follow-up appointment, make sure that you call for this appointment within a day or two after you arrive home to insure a convenient appointment time.  WHEN TO CALL YOUR DOCTOR: 1. Fever over 101.0 2. Inability to urinate 3. Continued bleeding from incision. 4. Increased pain, redness, or drainage from the incision. 5. Increasing abdominal pain  The clinic staff is available to answer your questions during regular business hours.  Please dont  hesitate to call and ask to speak to one of the nurses for clinical concerns.  If you have a medical emergency, go to the nearest emergency room or call 911.  A surgeon from [facility] is always on call at the hospital. [address], [place], [place]  [address] ? [address] [address], [place], [place]   [address] [phone] ? [phone] ? FAX [phone] Web site: [email or web address]   [facility] Surgical drains are used to remove extra fluid that normally builds up in a surgical wound after surgery. A surgical drain helps to heal a surgical wound. Different kinds of surgical drains include:  Active drains. These drains use suction to pull drainage away from the surgical wound. Drainage flows through a tube to a container outside of the body. It is important to keep the bulb or the drainage container flat (compressed) at all times, except while you empty it. Flattening the bulb or container creates suction. The two most common types of active drains are bulb drains and Hemovac drains.  Passive drains. These drains allow fluid to drain naturally, by gravity. Drainage flows through a tube to a bandage (dressing) or a container outside of the body. Passive drains do not need to be emptied. The most common type of passive drain is the Penrose drain. A drain is placed during surgery. Immediately after surgery, drainage is usually bright red and a little thicker than water. The drainage may gradually turn yellow or pink and become thinner. It is likely that your health care provider will remove the drain when the drainage stops or when the amount decreases to 1-2 Tbsp (15-30 mL) during a 24-hour period. How to care for your surgical drain It is important to care for your drain to prevent infection. If your drain is placed at your back, or any other hard-to-reach area, ask another person to assist you in performing the following steps:  Keep the skin  around the drain dry and covered with a dressing at all times.  Check your drain area every day for signs of infection. Check for: ? More redness, swelling, or pain. ? Pus or a bad smell. ? Cloudy drainage. Follow instructions from your health care provider about how to take care of your drain and how to change your dressing. Change your dressing at least one time every day. Change it more often if needed to keep the dressing dry. Make sure you: 1. Gather your supplies, including: ? Tape. ? Germ-free cleaning solution (sterile saline). ? Split gauze drain sponge: 4 x 4 inches (10 x 10 cm). ? Gauze square: 4 x 4 inches (10 x 10 cm). 2. Wash your hands with soap and water before you change your dressing. If soap and water are not available, use hand sanitizer. 3. Remove the old dressing. Avoid using scissors to do that. 4. Use sterile saline to clean your skin around the drain. 5. Place the tube through the slit in a drain sponge. Place the drain sponge so that it covers your wound. 6. Place the gauze square or another drain sponge on top of the drain sponge that is on the wound. Make sure the tube is between  those layers. 7. Tape the dressing to your skin. 8. If you have an active bulb or Hemovac drain, tape the drainage tube to your skin 1-2 inches (2.5-5 cm) below the place where the tube enters your body. Taping keeps the tube from pulling on any stitches (sutures) that you have. 9. Wash your hands with soap and water. 10. Write down the color of your drainage and how often you change your dressing. How to empty your active bulb or Hemovac drain  1. Make sure that you have a measuring cup that you can empty your drainage into. 2. Wash your hands with soap and water. If soap and water are not available, use hand sanitizer. 3. Gently move your fingers down the tube while squeezing very lightly. This is called stripping the tube. This clears any drainage, clots, or tissue from the tube. ? Do  not pull on the tube. ? You may need to strip the tube several times every day to keep the tube clear. 4. Open the bulb cap or the drain plug. Do not touch the inside of the cap or the bottom of the plug. 5. Empty all of the drainage into the measuring cup. 6. Compress the bulb or the container and replace the cap or the plug. To compress the bulb or the container, squeeze it firmly in the middle while you close the cap or plug the container. 7. Write down the amount of drainage that you have in each 24-hour period. If you have less than 2 Tbsp (30 mL) of drainage during 24 hours, contact your health care provider. 8. Flush the drainage down the toilet. 9. Wash your hands with soap and water. Contact a health care provider if:  You have more redness, swelling, or pain around your drain area.  The amount of drainage that you have is increasing instead of decreasing.  You have pus or a bad smell coming from your drain area.  You have a fever.  You have drainage that is cloudy.  There is a sudden stop or a sudden decrease in the amount of drainage that you have.  Your tube falls out.  Your active draindoes not stay compressedafter you empty it. Summary  Surgical drains are used to remove extra fluid that normally builds up in a surgical wound after surgery.  Different kinds of surgical drains include active drains and passive drains. Active drains use suction to pull drainage away from the surgical wound, and passive drains allow fluid to drain naturally.  It is important to care for your drain to prevent infection. If your drain is placed at your back, or any other hard-to-reach area, ask another person to assist you.  Contact your health care provider if you have more redness, swelling, or pain around your drain area. This information is not intended to replace advice given to you by your health care provider. Make sure you discuss any questions you have with your health care  provider. Document Released: 07/22/2000 Document Revised: 08/17/2017 Document Reviewed: 02/11/2015 Elsevier Interactive Patient Education  2019 [employer]   Surgical Drain Record Empty your surgical drain as told by your health care provider. Use this form to write down the amount of fluid that has collected in the drainage container. Bring this form with you to your follow-up visits. Note any characteristics of drainage if fluid has changed in appearance.  Surgical drain #1 location: ___________________  Date __________ Time __________ Amount __________ Date __________ Time __________ Amount __________ Date __________  Time __________ Amount __________ Date __________ Time __________ Amount __________ Date __________ Time __________ Amount __________ Date __________ Time __________ Amount __________ Date __________ Time __________ Amount __________ Date __________ Time __________ Amount __________ Date __________ Time __________ Amount __________ Date __________ Time __________ Amount __________ Date __________ Time __________ Amount __________ Date __________ Time __________ Amount __________ Date __________ Time __________ Amount __________ Date __________ Time __________ Amount __________ Date __________ Time __________ Amount __________ Date __________ Time __________ Amount __________ Date __________ Time __________ Amount __________ Date __________ Time __________ Amount __________ Date __________ Time __________ Amount __________ Date __________ Time __________ Amount __________ Date __________ Time __________ Amount __________ Surgical drain #2 location: ___________________ Date __________ Time __________ Amount __________ Date __________ Time __________ Amount __________ Date __________ Time __________ Amount __________ Date __________ Time __________ Amount __________ Date __________ Time __________ Amount __________ Date __________ Time __________ Amount  __________ Date __________ Time __________ Amount __________ Date __________ Time __________ Amount __________ Date __________ Time __________ Amount __________ Date __________ Time __________ Amount __________ Date __________ Time __________ Amount __________ Date __________ Time __________ Amount __________ Date __________ Time __________ Amount __________ Date __________ Time __________ Amount __________ Date __________ Time __________ Amount __________ Date __________ Time __________ Amount __________ Date __________ Time __________ Amount __________ Date __________ Time __________ Amount __________ Date __________ Time __________ Amount __________ Date __________ Time __________ Amount __________ Date __________ Time __________ Amount __________ This information is not intended to replace advice given to you by your health care provider. Make sure you discuss any questions you have with your health care provider. Document Released: 05/01/2017 Document Revised: 05/01/2017 Document Reviewed: 05/01/2017 Elsevier Interactive Patient Education  2019 [employer].

## 2020-06-15 ENCOUNTER — Emergency Department (HOSPITAL_COMMUNITY)
Admission: EM | Admit: 2020-06-15 | Discharge: 2020-06-16 | Disposition: A | Discharge status: Home / Self Care | Payer: Medicare Other | Attending: Emergency Medicine | Admitting: Emergency Medicine

## 2020-06-15 ENCOUNTER — Emergency Department (HOSPITAL_COMMUNITY): Payer: Medicare Other

## 2020-06-15 ENCOUNTER — Encounter (HOSPITAL_COMMUNITY): Payer: Self-pay

## 2020-06-15 ENCOUNTER — Other Ambulatory Visit: Payer: Self-pay

## 2020-06-15 DIAGNOSIS — F0390 Unspecified dementia without behavioral disturbance: Secondary | ICD-10-CM | POA: Insufficient documentation

## 2020-06-15 DIAGNOSIS — K5900 Constipation, unspecified: Secondary | ICD-10-CM | POA: Diagnosis not present

## 2020-06-15 DIAGNOSIS — R339 Retention of urine, unspecified: Secondary | ICD-10-CM | POA: Diagnosis present

## 2020-06-15 LAB — COMPREHENSIVE METABOLIC PANEL
ALT: 30 U/L (ref 0–44)
AST: 30 U/L (ref 15–41)
Albumin: 4.3 g/dL (ref 3.5–5.0)
Alkaline Phosphatase: 101 U/L (ref 38–126)
Anion gap: 11 (ref 5–15)
BUN: 19 mg/dL (ref 8–23)
CO2: 21 mmol/L — ABNORMAL LOW (ref 22–32)
Calcium: 9.4 mg/dL (ref 8.9–10.3)
Chloride: 105 mmol/L (ref 98–111)
Creatinine, Ser: 1.32 mg/dL — ABNORMAL HIGH (ref 0.61–1.24)
GFR, Estimated: 55 mL/min — ABNORMAL LOW (ref >60)
Glucose, Bld: 123 mg/dL — ABNORMAL HIGH (ref 70–99)
Potassium: 4.6 mmol/L (ref 3.5–5.1)
Sodium: 137 mmol/L (ref 135–145)
Total Bilirubin: 1.8 mg/dL — ABNORMAL HIGH (ref 0.3–1.2)
Total Protein: 7.4 g/dL (ref 6.5–8.1)

## 2020-06-15 LAB — URINALYSIS, ROUTINE W REFLEX MICROSCOPIC
Bilirubin Urine: NEGATIVE
Glucose, UA: NEGATIVE mg/dL
Hgb urine dipstick: NEGATIVE
Ketones, ur: NEGATIVE mg/dL
Leukocytes,Ua: NEGATIVE
Nitrite: NEGATIVE
Protein, ur: NEGATIVE mg/dL
Specific Gravity, Urine: 1.017 (ref 1.005–1.030)
pH: 5 (ref 5.0–8.0)

## 2020-06-15 LAB — CBC WITH DIFFERENTIAL/PLATELET
Abs Immature Granulocytes: 0.04 10*3/uL (ref 0.00–0.07)
Basophils Absolute: 0 10*3/uL (ref 0.0–0.1)
Basophils Relative: 0 %
Eosinophils Absolute: 0 10*3/uL (ref 0.0–0.5)
Eosinophils Relative: 0 %
HCT: 43.6 % (ref 39.0–52.0)
Hemoglobin: 15.4 g/dL (ref 13.0–17.0)
Immature Granulocytes: 0 %
Lymphocytes Relative: 12 %
Lymphs Abs: 1.3 10*3/uL (ref 0.7–4.0)
MCH: 33.2 pg (ref 26.0–34.0)
MCHC: 35.3 g/dL (ref 30.0–36.0)
MCV: 94 fL (ref 80.0–100.0)
Monocytes Absolute: 0.5 10*3/uL (ref 0.1–1.0)
Monocytes Relative: 5 %
Neutro Abs: 8.5 10*3/uL — ABNORMAL HIGH (ref 1.7–7.7)
Neutrophils Relative %: 83 %
Platelets: 169 10*3/uL (ref 150–400)
RBC: 4.64 MIL/uL (ref 4.22–5.81)
RDW: 13.5 % (ref 11.5–15.5)
WBC: 10.3 10*3/uL (ref 4.0–10.5)
nRBC: 0 % (ref 0.0–0.2)

## 2020-06-15 MED ORDER — SODIUM CHLORIDE 0.9 % IV BOLUS
500.0000 mL | Freq: Once | INTRAVENOUS | Status: AC
  Administered 2020-06-16: 500 mL via INTRAVENOUS

## 2020-06-15 NOTE — ED Triage Notes (Signed)
Pt support person sts that pt has been unable to urinate or pass bowel movement. No urine output today and no bowel movement in 3 days. Reports prostate issues but say it is usually controlled with supplements.

## 2020-06-15 NOTE — ED Notes (Signed)
Bladder scan results: 721 mL.

## 2020-06-15 NOTE — ED Provider Notes (Signed)
[facility]-EMERGENCY DEPT Provider Note   CSN: [id number] Arrival date & time: 06/15/20  2041     History Chief Complaint  Patient presents with  . Constipation  . Urinary Retention    William Bishop is a 79 y.o. male with a history of dementia who presents to the emergency department with his wife for evaluation of constipation for the past 4 days and urinary retention that began today.  Level 5 caveat applies secondary to dementia, history is primarily provided by the patient's wife.  She states that he has not had a regular bowel movement in 4 days, has felt the need to have a BM and does have a bit of stool when wiper but no formed bowel movement. Today has been unable to fully urinate, had some dribbling this AM- none since. With these sxs has been complaining of abdominal pain.  Nursing staff place Foley catheter prior to my assessment, patient states this helped with his pain.  Deny any fevers, nausea, vomiting, weakness, or numbness. No medication changes. No recent surgeries. No hx of same.   HPI     Past Medical History:  Diagnosis Date  . Dementia (HCC)   . Renal disorder     Patient Active Problem List   Diagnosis Date Noted  . Cholecystitis with cholelithiasis 11/03/2018    Past Surgical History:  Procedure Laterality Date  . arm fracture surgery    . CHOLECYSTECTOMY N/A 11/06/2018   Procedure: LAPAROSCOPIC CHOLECYSTECTOMY WITH INTRAOPERATIVE CHOLANGIOGRAM;  Surgeon: Ovidio KinNewman, David, MD;  Location: [facility];  Service: General;  Laterality: N/A;       Family History  Family history unknown: Yes    Social History   Tobacco Use  . Smoking status: Never Smoker  . Smokeless tobacco: Current User    Types: Chew  Vaping Use  . Vaping Use: Never used  Substance Use Topics  . Alcohol use: Never  . Drug use: Never    Home Medications Prior to Admission medications   Medication Sig Start Date End Date Taking? Authorizing Provider    acetaminophen (TYLENOL) 500 MG tablet Take 500 mg by mouth once.    [provider]  traMADol (ULTRAM) 50 MG tablet Take 1 tablet (50 mg total) by mouth every 6 (six) hours as needed for severe pain. 11/07/18   Juliet RudeJohnson, Kelly R, PA-C    Allergies    Patient has no known allergies.  Review of Systems   Review of Systems  Unable to perform ROS: Dementia    Physical Exam Updated Vital Signs BP (!) 134/93 (BP Location: Left Arm)   Pulse 99   Temp 98.7 F (37.1 C) (Oral)   Resp 15   Ht 5\' 7"  (1.702 m)   Wt 93.9 kg   SpO2 96%   BMI 32.42 kg/m   Physical Exam Vitals and nursing note reviewed.  Constitutional:      General: He is not in acute distress.    Appearance: He is well-developed. He is not toxic-appearing.  HENT:     Head: Normocephalic and atraumatic.  Eyes:     General:        Right eye: No discharge.        Left eye: No discharge.     Conjunctiva/sclera: Conjunctivae normal.  Cardiovascular:     Rate and Rhythm: Normal rate and regular rhythm.  Pulmonary:     Effort: Pulmonary effort is normal. No respiratory distress.     Breath sounds: Normal  breath sounds. No wheezing, rhonchi or rales.  Abdominal:     General: Bowel sounds are normal. There is no distension.     Palpations: Abdomen is soft.     Tenderness: There is abdominal tenderness (mild lower). There is no guarding or rebound.  Genitourinary:    Comments: Foley catheter placed by nursing staff prior to my assessment with 900 cc of clear yellow urine present. Musculoskeletal:     Cervical back: Neck supple.  Skin:    General: Skin is warm and dry.     Findings: No rash.  Neurological:     Mental Status: He is alert.     Comments: Clear speech.  Ambulatory.  Psychiatric:        Behavior: Behavior normal.     ED Results / Procedures / Treatments   Labs (all labs ordered are listed, but only abnormal results are displayed) Labs Reviewed  CBC WITH DIFFERENTIAL/PLATELET - Abnormal;  Notable for the following components:      Result Value   Neutro Abs 8.5 (*)    All other components within normal limits  URINALYSIS, ROUTINE W REFLEX MICROSCOPIC  COMPREHENSIVE METABOLIC PANEL    EKG None  Radiology CT Abdomen Pelvis W Contrast  Result Date: 06/16/2020 CLINICAL DATA:  Abdominal pain, nonlocalized, a with urinating past bowel movement EXAM: CT ABDOMEN AND PELVIS WITH CONTRAST TECHNIQUE: Multidetector CT imaging of the abdomen and pelvis was performed using the standard protocol following bolus administration of intravenous contrast. CONTRAST:  OMNIPAQUE IOHEXOL 300 MG/ML  SOLN COMPARISON:  CT 11/02/2018 FINDINGS: Lower chest: Consolidative opacity is seen in the inferior aspect of the right upper lobe, partially included within the level of imaging. More diffuse subpleural reticular changes and ground-glass, may reflect an a combination of interstitial change and atelectasis given the appearance on this imaging. Stable subpleural 7 mm nodule in the right middle lobe (6/12). A previously seen right upper lobe perifissural nodule is beyond the margins of imaging. Small 5 mm subpleural nodule in the right lower lobe is stable (6/14). Stable 7 mm nodule in the lingula adjacent the cardiac apex. Hepatobiliary: Diffuse hepatic hypoattenuation compatible with hepatic steatosis. No focal liver abnormality is seen. Patient is post cholecystectomy. No biliary dilatation. No calcified intraductal gallstones. Pancreas: No pancreatic ductal dilatation or surrounding inflammatory changes. Spleen: Normal in size. No concerning splenic lesions. Adrenals/Urinary Tract: Normal adrenal glands. Kidneys are normally located with symmetric enhancement and excretion. Multiple fluid attenuation cysts are present in both kidneys including a combination of cortical and parapelvic cysts bilaterally. No concerning renal mass. Few clustered nonobstructing calculi are again seen in the lower pole right  kidney. No obstructing calculi are seen. Some mild bilateral ureterectasis is seen with distal periureteral stranding. Mild symmetric bilateral perinephric stranding, similar to prior exam, a nonspecific finding. Bladder decompressed by inflated Foley catheter. Some mild wall thickening perivesicular hazy stranding is noted. Stomach/Bowel: Small sliding-type hiatal hernia. Distal stomach and duodenum are unremarkable. No small bowel thickening or dilatation. A normal appendix is visualized. No colonic dilatation or wall thickening. No evidence of obstruction. Vascular/Lymphatic: Atherosclerotic calcifications within the abdominal aorta and branch vessels. No aneurysm or ectasia. No enlarged abdominopelvic lymph nodes. Reproductive: Enlarged prostate gland, similar to comparison. No focal lesion or abnormality of the seminal vesicles is seen. Traversed by the inflated Foley catheter. Other: No bowel containing hernias. Slightly increased stranding within a stable sized fat containing umbilical hernia. Some trace free fluid/inflammatory changes seen along the low anterior pelvis  and left pelvic sidewall, nonspecific, possibly reactive. No free air. Musculoskeletal: No acute osseous abnormality or suspicious osseous lesion. Multilevel degenerative changes are present in the imaged portions of the spine. Additional degenerative changes in the hips and pelvis. IMPRESSION: 1. Consolidative opacity in the inferior aspect of the right upper lobe, partially included within the level of imaging. Findings are concerning for pneumonia. 2. Some mild bilateral ureterectasis is seen with distal periureteral stranding with perivesicular stranding. No obstructing calculi are seen. Findings could reflect a recently passed stone or ascending urinary tract infection with some nonspecific bilateral symmetric stranding as well. Correlate with patient's symptoms and consider urinalysis. 3. Some trace free fluid/inflammatory changes along  the low anterior pelvis and left pelvic sidewall, nonspecific, possibly reactive. 4. No evidence of bowel obstruction or large colonic stool burden. 5. Enlarged prostate gland, similar to comparison exam. 6. Hepatic steatosis. 7. Aortic Atherosclerosis (ICD10-I70.0). Electronically Signed   By: Kreg Shropshire M.D.   On: 06/16/2020 01:06   DG ABD ACUTE 2+V W 1V CHEST  Result Date: 06/15/2020 CLINICAL DATA:  Constipation. EXAM: DG ABDOMEN ACUTE WITH 1 VIEW CHEST COMPARISON:  None. FINDINGS: The lung volumes are low. The cardiomediastinal silhouette is extension weighted. There is a hazy airspace opacity in the right mid lung zone. There is no pneumothorax or large pleural effusion. There is a large amount of stool throughout the colon. Multiple right-sided kidney stones are noted. There are few calcifications in the right hemipelvis favored to represent phleboliths. There are degenerative changes of the spine and both hips. IMPRESSION: 1. Hazy airspace opacity in the right mid lung zone. This may represent atelectasis or developing infiltrate. 2. Large stool burden. 3. Right-sided nephrolithiasis. Electronically Signed   By: Katherine Mantle M.D.   On: 06/15/2020 22:30    Procedures Procedures (including critical care time)  Medications Ordered in ED Medications  sodium chloride 0.9 % bolus 500 mL (0 mLs Intravenous Stopped 06/16/20 0120)  iohexol (OMNIPAQUE) 300 MG/ML solution 100 mL (100 mLs Intravenous Contrast Given 06/16/20 0031)  sodium chloride 0.9 % bolus 1,000 mL (1,000 mLs Intravenous New Bag/Given 06/16/20 0218)    ED Course  I have reviewed the triage vital signs and the nursing notes.  Pertinent labs & imaging results that were available during my care of the patient were reviewed by me and considered in my medical decision making (see chart for details).    MDM Rules/Calculators/A&P                         Patient presents to the ED with complaints of constipation x 4 days & urinary  retention starting today. He is nontoxic, vitals without significant abnormality.   Bladder scan by nursing staff on arrival- >700 ccs -> Foley catheter placed with good UOP- 900 ccs present on my assessment.   Additional history obtained:  Additional history obtained from patient's wife & chart review.   Lab Tests:  I reviewed and interpreted labs, which included:  CBC: Fairly unremarkable.  CMP: Mild worsening renal function.  UA: No UTI.   Imaging Studies ordered:  Chest/abdominal series, I independently visualized and interpreted imaging which showed 1. Hazy airspace opacity in the right mid lung zone. This may represent atelectasis or developing infiltrate. 2. Large stool burden. 3. Right-sided nephrolithiasis.  I subsequently ordered CT A/P:  - Findings concerning for pneumonia- patient has had a mild cough per his wife, but no dyspnea or fevers noted, will cover  with doxycycline.  - Findings of mild bilateral ureterectasis w/ distal periureteral & perivesicular stranding- no obstruction, possibly recently passed stone or ascending UTI per radiology, no urinary sxs other than retention and UA Is clean, urine sent for culture.  - Additional findings as above.   Will discharge home with foley catheter in place- urology follow up, miralax PRN constipation, and doxycycline to cover for pneumonia. Plan for discharge.   When discussing results & plan of care with patient & his wife patient's heart rate noted to be in the 120s, EKG ordered- sinus tachycardia, fluids ordered & will continue to monitor.   02:44: HR normalized with fluids. Appears appropriate for discharge at this time. I discussed results, treatment plan, need for follow-up, and return precautions with the patient & his wife. Provided opportunity for questions, patient's wife confirmed understanding and is in agreement with plan.   Findings and plan of care discussed with supervising physician Dr. Rhunette Croft who has evaluated  the patient as shared visit.   Blood pressure (!) 148/106, pulse 97, temperature 98.5 F (36.9 C), temperature source Oral, resp. rate 16, height 5\' 7"  (1.702 m), weight 93.9 kg, SpO2 97 %.  Portions of this note were generated with . Dictation errors may occur despite best attempts at proofreading.  Final Clinical Impression(s) / ED Diagnoses Final diagnoses:  Urinary retention  Constipation, unspecified constipation type    Rx / DC Orders ED Discharge Orders         Ordered    doxycycline (VIBRAMYCIN) 100 MG capsule  2 times daily        06/16/20 0242    polyethylene glycol (MIRALAX) 17 g packet  Daily PRN        06/16/20 0242           Samyuktha Brau, 13/09/21, PA-C 06/16/20 0246    13/09/21, MD 06/16/20 1746

## 2020-06-16 ENCOUNTER — Emergency Department (HOSPITAL_COMMUNITY): Payer: Medicare Other

## 2020-06-16 ENCOUNTER — Encounter (HOSPITAL_COMMUNITY): Payer: Self-pay

## 2020-06-16 MED ORDER — SODIUM CHLORIDE 0.9 % IV BOLUS
1000.0000 mL | Freq: Once | INTRAVENOUS | Status: AC
  Administered 2020-06-16: 1000 mL via INTRAVENOUS

## 2020-06-16 MED ORDER — IOHEXOL 300 MG/ML  SOLN
100.0000 mL | Freq: Once | INTRAMUSCULAR | Status: AC | PRN
  Administered 2020-06-16: 100 mL via INTRAVENOUS

## 2020-06-16 MED ORDER — IOHEXOL 350 MG/ML SOLN: 100.0000 mL | Freq: Once | INTRAVENOUS | Status: DC | PRN

## 2020-06-16 MED ORDER — POLYETHYLENE GLYCOL 3350 17 G PO PACK: 17.0000 g | PACK | Freq: Every day | ORAL | 0 refills | Status: AC | PRN

## 2020-06-16 MED ORDER — SODIUM CHLORIDE 0.9 % IV BOLUS: 500.0000 mL | Freq: Once | INTRAVENOUS | Status: DC

## 2020-06-16 MED ORDER — DOXYCYCLINE HYCLATE 100 MG PO CAPS: 100.0000 mg | ORAL_CAPSULE | Freq: Two times a day (BID) | ORAL | 0 refills | Status: DC

## 2020-06-16 NOTE — Discharge Instructions (Addendum)
You were seen in the emergency department today for trouble urinating and having a bowel movement. We have placed a Foley catheter, this will need to stay in place at all times and to have followed up with urology, please call the office tomorrow to schedule an appointment within the next 3 days. Sending home with MiraLAX to take once daily as needed for constipation. Please see attached diet guidelines as well. Your CT scan showed some swelling throughout the urinary tract as well as enlargement of your prostate, these are things to discuss at your urology follow-up appointment. Your CT scan also showed some findings concerning for pneumonia, we are sending home with doxycycline to take twice per day for the next 1 week to treat a possible pneumonia.  We have prescribed you new medication(s) today. Discuss the medications prescribed today with your pharmacist as they can have adverse effects and interactions with your other medicines including over the counter and prescribed medications. Seek medical evaluation if you start to experience new or abnormal symptoms after taking one of these medicines, seek care immediately if you start to experience difficulty breathing, feeling of your throat closing, facial swelling, or rash as these could be indications of a more serious allergic reaction  Please follow-up with your primary care provider within 3 days for general reevaluation. Please follow-up with urology as discussed above. Return to the ER for any new or worsening symptoms including but not limited to problems with your catheter, inability to have a bowel movement, abdominal pain, fever, vomiting, trouble breathing, chest pain, or any other concerns.

## 2020-06-16 NOTE — ED Notes (Signed)
Pt's wife informed to encourage the pt to drink water.

## 2020-06-24 ENCOUNTER — Emergency Department (HOSPITAL_COMMUNITY): Payer: Medicare Other

## 2020-06-24 ENCOUNTER — Encounter (HOSPITAL_COMMUNITY): Payer: Self-pay

## 2020-06-24 ENCOUNTER — Other Ambulatory Visit: Payer: Self-pay

## 2020-06-24 ENCOUNTER — Inpatient Hospital Stay (HOSPITAL_COMMUNITY)
Admission: EM | Admit: 2020-06-24 | Discharge: 2020-07-08 | DRG: 659 | Disposition: A | Discharge status: Hospice | Payer: Medicare Other | Attending: Internal Medicine | Admitting: Internal Medicine

## 2020-06-24 DIAGNOSIS — G928 Other toxic encephalopathy: Secondary | ICD-10-CM | POA: Diagnosis not present

## 2020-06-24 DIAGNOSIS — G9341 Metabolic encephalopathy: Secondary | ICD-10-CM | POA: Diagnosis not present

## 2020-06-24 DIAGNOSIS — N281 Cyst of kidney, acquired: Secondary | ICD-10-CM | POA: Diagnosis present

## 2020-06-24 DIAGNOSIS — R6520 Severe sepsis without septic shock: Secondary | ICD-10-CM | POA: Diagnosis present

## 2020-06-24 DIAGNOSIS — I129 Hypertensive chronic kidney disease with stage 1 through stage 4 chronic kidney disease, or unspecified chronic kidney disease: Secondary | ICD-10-CM | POA: Diagnosis not present

## 2020-06-24 DIAGNOSIS — N3289 Other specified disorders of bladder: Secondary | ICD-10-CM | POA: Diagnosis present

## 2020-06-24 DIAGNOSIS — N179 Acute kidney failure, unspecified: Secondary | ICD-10-CM | POA: Diagnosis present

## 2020-06-24 DIAGNOSIS — E876 Hypokalemia: Secondary | ICD-10-CM | POA: Diagnosis present

## 2020-06-24 DIAGNOSIS — A4159 Other Gram-negative sepsis: Secondary | ICD-10-CM | POA: Diagnosis not present

## 2020-06-24 DIAGNOSIS — N136 Pyonephrosis: Secondary | ICD-10-CM | POA: Diagnosis present

## 2020-06-24 DIAGNOSIS — F0390 Unspecified dementia without behavioral disturbance: Secondary | ICD-10-CM | POA: Diagnosis present

## 2020-06-24 DIAGNOSIS — Z9049 Acquired absence of other specified parts of digestive tract: Secondary | ICD-10-CM

## 2020-06-24 DIAGNOSIS — D696 Thrombocytopenia, unspecified: Secondary | ICD-10-CM | POA: Diagnosis present

## 2020-06-24 DIAGNOSIS — Y846 Urinary catheterization as the cause of abnormal reaction of the patient, or of later complication, without mention of misadventure at the time of the procedure: Secondary | ICD-10-CM | POA: Diagnosis present

## 2020-06-24 DIAGNOSIS — Z720 Tobacco use: Secondary | ICD-10-CM | POA: Diagnosis not present

## 2020-06-24 DIAGNOSIS — Z66 Do not resuscitate: Secondary | ICD-10-CM | POA: Diagnosis present

## 2020-06-24 DIAGNOSIS — R310 Gross hematuria: Secondary | ICD-10-CM | POA: Diagnosis present

## 2020-06-24 DIAGNOSIS — A498 Other bacterial infections of unspecified site: Secondary | ICD-10-CM | POA: Diagnosis not present

## 2020-06-24 DIAGNOSIS — T83511A Infection and inflammatory reaction due to indwelling urethral catheter, initial encounter: Secondary | ICD-10-CM | POA: Diagnosis not present

## 2020-06-24 DIAGNOSIS — A419 Sepsis, unspecified organism: Secondary | ICD-10-CM | POA: Diagnosis present

## 2020-06-24 DIAGNOSIS — R4182 Altered mental status, unspecified: Secondary | ICD-10-CM | POA: Diagnosis present

## 2020-06-24 DIAGNOSIS — N201 Calculus of ureter: Secondary | ICD-10-CM | POA: Diagnosis not present

## 2020-06-24 DIAGNOSIS — N390 Urinary tract infection, site not specified: Secondary | ICD-10-CM | POA: Diagnosis present

## 2020-06-24 DIAGNOSIS — N182 Chronic kidney disease, stage 2 (mild): Secondary | ICD-10-CM | POA: Diagnosis present

## 2020-06-24 DIAGNOSIS — K5900 Constipation, unspecified: Secondary | ICD-10-CM | POA: Diagnosis present

## 2020-06-24 DIAGNOSIS — Z515 Encounter for palliative care: Secondary | ICD-10-CM | POA: Diagnosis not present

## 2020-06-24 DIAGNOSIS — Z20822 Contact with and (suspected) exposure to covid-19: Secondary | ICD-10-CM | POA: Diagnosis present

## 2020-06-24 DIAGNOSIS — Z79899 Other long term (current) drug therapy: Secondary | ICD-10-CM | POA: Diagnosis not present

## 2020-06-24 DIAGNOSIS — N401 Enlarged prostate with lower urinary tract symptoms: Secondary | ICD-10-CM | POA: Diagnosis not present

## 2020-06-24 DIAGNOSIS — Z6835 Body mass index (BMI) 35.0-35.9, adult: Secondary | ICD-10-CM | POA: Diagnosis not present

## 2020-06-24 DIAGNOSIS — Z87442 Personal history of urinary calculi: Secondary | ICD-10-CM | POA: Diagnosis not present

## 2020-06-24 DIAGNOSIS — R338 Other retention of urine: Secondary | ICD-10-CM | POA: Diagnosis present

## 2020-06-24 DIAGNOSIS — Z7189 Other specified counseling: Secondary | ICD-10-CM | POA: Diagnosis not present

## 2020-06-24 LAB — CBC WITH DIFFERENTIAL/PLATELET
Abs Immature Granulocytes: 0 10*3/uL (ref 0.00–0.07)
Band Neutrophils: 12 %
Basophils Absolute: 0 10*3/uL (ref 0.0–0.1)
Basophils Relative: 0 %
Blasts: 0 %
Eosinophils Absolute: 0.1 10*3/uL (ref 0.0–0.5)
Eosinophils Relative: 1 %
HCT: 41.5 % (ref 39.0–52.0)
Hemoglobin: 14.6 g/dL (ref 13.0–17.0)
Lymphocytes Relative: 6 %
Lymphs Abs: 0.5 10*3/uL — ABNORMAL LOW (ref 0.7–4.0)
MCH: 33 pg (ref 26.0–34.0)
MCHC: 35.2 g/dL (ref 30.0–36.0)
MCV: 93.9 fL (ref 80.0–100.0)
Metamyelocytes Relative: 0 %
Monocytes Absolute: 0.1 10*3/uL (ref 0.1–1.0)
Monocytes Relative: 1 %
Myelocytes: 0 %
Neutro Abs: 7.8 10*3/uL — ABNORMAL HIGH (ref 1.7–7.7)
Neutrophils Relative %: 80 %
Other: 0 %
Platelets: 174 10*3/uL (ref 150–400)
Promyelocytes Relative: 0 %
RBC: 4.42 MIL/uL (ref 4.22–5.81)
RDW: 13.6 % (ref 11.5–15.5)
WBC: 8.5 10*3/uL (ref 4.0–10.5)
nRBC: 0 % (ref 0.0–0.2)
nRBC: 0 /100 WBC

## 2020-06-24 LAB — URINALYSIS, ROUTINE W REFLEX MICROSCOPIC
Bilirubin Urine: NEGATIVE
Glucose, UA: NEGATIVE mg/dL
Ketones, ur: NEGATIVE mg/dL
Nitrite: NEGATIVE
Protein, ur: NEGATIVE mg/dL
Specific Gravity, Urine: 1.013 (ref 1.005–1.030)
WBC, UA: >50 WBC/hpf — ABNORMAL HIGH (ref 0–5)
pH: 5 (ref 5.0–8.0)

## 2020-06-24 LAB — RESPIRATORY PANEL BY RT PCR (FLU A&B, COVID)
Influenza A by PCR: NEGATIVE
Influenza B by PCR: NEGATIVE
SARS Coronavirus 2 by RT PCR: NEGATIVE

## 2020-06-24 LAB — BLOOD GAS, VENOUS
Acid-base deficit: 0.3 mmol/L (ref 0.0–2.0)
Bicarbonate: 22.8 mmol/L (ref 20.0–28.0)
O2 Saturation: 74.2 %
Patient temperature: 98.6
pCO2, Ven: 34.4 mmHg — ABNORMAL LOW (ref 44.0–60.0)
pH, Ven: 7.438 — ABNORMAL HIGH (ref 7.250–7.430)
pO2, Ven: 38 mmHg (ref 32.0–45.0)

## 2020-06-24 LAB — COMPREHENSIVE METABOLIC PANEL
ALT: 37 U/L (ref 0–44)
AST: 52 U/L — ABNORMAL HIGH (ref 15–41)
Albumin: 4.2 g/dL (ref 3.5–5.0)
Alkaline Phosphatase: 117 U/L (ref 38–126)
Anion gap: 12 (ref 5–15)
BUN: 22 mg/dL (ref 8–23)
CO2: 21 mmol/L — ABNORMAL LOW (ref 22–32)
Calcium: 9.3 mg/dL (ref 8.9–10.3)
Chloride: 105 mmol/L (ref 98–111)
Creatinine, Ser: 1.35 mg/dL — ABNORMAL HIGH (ref 0.61–1.24)
GFR, Estimated: 53 mL/min — ABNORMAL LOW (ref >60)
Glucose, Bld: 96 mg/dL (ref 70–99)
Potassium: 3.8 mmol/L (ref 3.5–5.1)
Sodium: 138 mmol/L (ref 135–145)
Total Bilirubin: 1.7 mg/dL — ABNORMAL HIGH (ref 0.3–1.2)
Total Protein: 7.1 g/dL (ref 6.5–8.1)

## 2020-06-24 LAB — PROTIME-INR
INR: 1.1 (ref 0.8–1.2)
Prothrombin Time: 13.5 seconds (ref 11.4–15.2)

## 2020-06-24 LAB — APTT: aPTT: 26 seconds (ref 24–36)

## 2020-06-24 LAB — LACTIC ACID, PLASMA
Lactic Acid, Venous: 3.6 mmol/L (ref 0.5–1.9)
Lactic Acid, Venous: 2.6 mmol/L (ref 0.5–1.9)

## 2020-06-24 MED ORDER — SODIUM CHLORIDE 0.9 % IV SOLN
2.0000 g | Freq: Once | INTRAVENOUS | Status: AC
  Administered 2020-06-24: 2 g via INTRAVENOUS
  Filled 2020-06-24: qty 2

## 2020-06-24 MED ORDER — LACTATED RINGERS IV BOLUS (SEPSIS)
1000.0000 mL | Freq: Once | INTRAVENOUS | Status: AC
  Administered 2020-06-24: 1000 mL via INTRAVENOUS

## 2020-06-24 MED ORDER — HALOPERIDOL LACTATE 5 MG/ML IJ SOLN: 2.0000 mg | Freq: Once | INTRAMUSCULAR | Status: DC

## 2020-06-24 MED ORDER — LACTATED RINGERS IV SOLN: INTRAVENOUS | Status: AC

## 2020-06-24 MED ORDER — ACETAMINOPHEN 650 MG RE SUPP
1300.0000 mg | Freq: Once | RECTAL | Status: AC
  Administered 2020-06-24: 1300 mg via RECTAL
  Filled 2020-06-24: qty 2

## 2020-06-24 NOTE — H&P (Signed)
History and Physical   William Bishop:[id number] DOB: Feb 14, 1941 DOA: 06/24/2020  Referring MD/NP/PA: Dr Effie Shy  PCP: [name], MD   Patient coming from: Home  Chief Complaint: AMS  HPI: William Bishop is a 79 y.o. male with medical history significant of Dementia, Urinary retention, recent constipation who has recent visit to the ER on November 8 where he had acute urinary retention. Patient had a Foley catheter inserted and then removed days later. He came back today with worsening mental status fever. Unable to give any history. Wife is at bedside and reported that he has dementia but this is getting worse. He is so confused agitated picking up things and seeing things that is unusual for him. Patient worked up in the ER and found to be meeting criteria for sepsis. His urinalysis seems to be indicating the source of the sepsis. At this point is being admitted with sepsis due to UTI..  ED Course: Temperature is 105.1 blood pressure 140/104 pulse 135 respirate 32 oxygen sats 93% on room air. Chemistry showed glucose 103 AST 102 ALT 65. White count is 18.6 lactic acid of 3.6. COVID-19 is negative. Urinalysis showed hazy urine with moderate hemoglobin large leukocytes WBC more than 50 rare bacteria. RBC 11-20. Patient being admitted with sepsis secondary to UTI.  Review of Systems: As per HPI otherwise 10 point review of systems negative.    Past Medical History:  Diagnosis Date  . Dementia (HCC)   . Renal disorder     Past Surgical History:  Procedure Laterality Date  . arm fracture surgery    . CHOLECYSTECTOMY N/A 11/06/2018   Procedure: LAPAROSCOPIC CHOLECYSTECTOMY WITH INTRAOPERATIVE CHOLANGIOGRAM;  Surgeon: Ovidio Kin, MD;  Location: [facility];  Service: General;  Laterality: N/A;     reports that he has never smoked. He has quit using smokeless tobacco.  His smokeless tobacco use included chew. He reports that he does not drink alcohol and does not use drugs.  No  Known Allergies  Family History  Family history unknown: Yes     Prior to Admission medications   Medication Sig Start Date End Date Taking? Authorizing Provider  Calcium Carbonate (CALCIUM 500 PO) Take 2 tablets by mouth daily.   Yes [provider]  Cholecalciferol (VITAMIN D3) 1.25 MG (50000 UT) TABS Take 2 tablets by mouth daily.   Yes [provider]  co-enzyme Q-10 30 MG capsule Take 30 mg by mouth daily.   Yes [provider]  doxycycline (VIBRAMYCIN) 100 MG capsule Take 1 capsule (100 mg total) by mouth 2 (two) times daily. 06/16/20  Yes Petrucelli, Samantha R, PA-C  isosorbide mononitrate (IMDUR) 30 MG 24 hr tablet Take 30 mg by mouth daily. 05/20/20  Yes [provider]  KRILL OIL PO Take 1 tablet by mouth daily.   Yes [provider]  Misc Natural Products (TOTAL CARDIO HEALTH FORMULA PO) Take 2 tablets by mouth daily. CARDIO BP- Supplement   Yes [provider]  OVER THE COUNTER MEDICATION Take 1 tablet by mouth daily. Immune 5 supplement   Yes [provider]  OVER THE COUNTER MEDICATION Take 1 tablet by mouth daily. Brain Elevate Supplement   Yes [provider]  QUEtiapine (SEROQUEL) 25 MG tablet Take 50 mg by mouth at bedtime.  05/15/20  Yes [provider]  vitamin C (ASCORBIC ACID) 250 MG tablet Take 250 mg by mouth daily.   Yes [provider]  polyethylene glycol (MIRALAX) 17  g packet Take 17 g by mouth daily as needed for moderate constipation. 06/16/20   Petrucelli, Samantha R, PA-C  traMADol (ULTRAM) 50 MG tablet Take 1 tablet (50 mg total) by mouth every 6 (six) hours as needed for severe pain. 11/07/18   Juliet Rude, PA-C    Physical Exam: Vitals:   06/24/20 1900 06/24/20 1905 06/24/20 1941 06/24/20 2036  BP: (!) 140/104  112/76 103/71  Pulse: (!) 128  (!) 125 (!) 113  Resp: (!) 31  (!) 24 (!) 27  Temp:  (!) 102 F (38.9 C)    TempSrc:  Rectal    SpO2: 100%  94% 94%    Weight:      Height:          Constitutional: Confused and agitated Vitals:   06/24/20 1900 06/24/20 1905 06/24/20 1941 06/24/20 2036  BP: (!) 140/104  112/76 103/71  Pulse: (!) 128  (!) 125 (!) 113  Resp: (!) 31  (!) 24 (!) 27  Temp:  (!) 102 F (38.9 C)    TempSrc:  Rectal    SpO2: 100%  94% 94%  Weight:      Height:       Eyes: PERRL, lids and conjunctivae normal ENMT: Mucous membranes are dry. Posterior pharynx clear of any exudate or lesions.Normal dentition.  Neck: normal, supple, no masses, no thyromegaly Respiratory: clear to auscultation bilaterally, no wheezing, no crackles. Normal respiratory effort. No accessory muscle use.  Cardiovascular: Sinus tachycardia, no murmurs / rubs / gallops. No extremity edema. 2+ pedal pulses. No carotid bruits.  Abdomen: no tenderness, no masses palpated. No hepatosplenomegaly. Bowel sounds positive.  Musculoskeletal: no clubbing / cyanosis. No joint deformity upper and lower extremities. Good ROM, no contractures. Normal muscle tone.  Skin: no rashes, lesions, ulcers. No induration Neurologic: CN 2-12 grossly intact. Sensation intact, DTR normal. Strength 5/5 in all 4.  Psychiatric: Confused, agitated, fidgety    Labs on Admission: I have personally reviewed following labs and imaging studies  CBC: Recent Labs  Lab 06/24/20 1807  WBC 8.5  NEUTROABS 7.8*  HGB 14.6  HCT 41.5  MCV 93.9  PLT 174   Basic Metabolic Panel: Recent Labs  Lab 06/24/20 1807  NA 138  K 3.8  CL 105  CO2 21*  GLUCOSE 96  BUN 22  CREATININE 1.35*  CALCIUM 9.3   GFR: Estimated Creatinine Clearance: 47.9 mL/min (A) (by C-G formula based on SCr of 1.35 mg/dL (H)). Liver Function Tests: Recent Labs  Lab 06/24/20 1807  AST 52*  ALT 37  ALKPHOS 117  BILITOT 1.7*  PROT 7.1  ALBUMIN 4.2   No results for input(s): LIPASE, AMYLASE in the last 168 hours. No results for input(s): AMMONIA in the last 168 hours. Coagulation Profile: Recent  Labs  Lab 06/24/20 1807  INR 1.1   Cardiac Enzymes: No results for input(s): CKTOTAL, CKMB, CKMBINDEX, TROPONINI in the last 168 hours. BNP (last 3 results) No results for input(s): PROBNP in the last 8760 hours. HbA1C: No results for input(s): HGBA1C in the last 72 hours. CBG: No results for input(s): GLUCAP in the last 168 hours. Lipid Profile: No results for input(s): CHOL, HDL, LDLCALC, TRIG, CHOLHDL, LDLDIRECT in the last 72 hours. Thyroid Function Tests: No results for input(s): TSH, T4TOTAL, FREET4, T3FREE, THYROIDAB in the last 72 hours. Anemia Panel: No results for input(s): VITAMINB12, FOLATE, FERRITIN, TIBC, IRON, RETICCTPCT in the last 72 hours. Urine analysis:    Component Value Date/Time  COLORURINE YELLOW 06/24/2020 1828   APPEARANCEUR HAZY (A) 06/24/2020 1828   LABSPEC 1.013 06/24/2020 1828   PHURINE 5.0 06/24/2020 1828   GLUCOSEU NEGATIVE 06/24/2020 1828   HGBUR MODERATE (A) 06/24/2020 1828   BILIRUBINUR NEGATIVE 06/24/2020 1828   KETONESUR NEGATIVE 06/24/2020 1828   PROTEINUR NEGATIVE 06/24/2020 1828   NITRITE NEGATIVE 06/24/2020 1828   LEUKOCYTESUR LARGE (A) 06/24/2020 1828   Sepsis Labs: @LABRCNTIP (procalcitonin:4,lacticidven:4) ) Recent Results (from the past 240 hour(s))  Respiratory Panel by RT PCR (Flu A&B, Covid) - Nasopharyngeal Swab     Status: None   Collection Time: 06/24/20  6:26 PM   Specimen: Nasopharyngeal Swab  Result Value Ref Range Status   SARS Coronavirus 2 by RT PCR NEGATIVE NEGATIVE Final    Comment: (NOTE) SARS-CoV-2 target nucleic acids are NOT DETECTED.  The SARS-CoV-2 RNA is generally detectable in upper respiratoy specimens during the acute phase of infection. The lowest concentration of SARS-CoV-2 viral copies this assay can detect is 131 copies/mL. A negative result does not preclude SARS-Cov-2 infection and should not be used as the sole basis for treatment or other patient management decisions. A negative result may  occur with  improper specimen collection/handling, submission of specimen other than nasopharyngeal swab, presence of viral mutation(s) within the areas targeted by this assay, and inadequate number of viral copies (<131 copies/mL). A negative result must be combined with clinical observations, patient history, and epidemiological information. The expected result is Negative.  Fact Sheet for Patients:  06/26/20  Fact Sheet for Healthcare Providers:  https://www.moore.com/  This test is no t yet approved or cleared by the https://www.young.biz/ FDA and  has been authorized for detection and/or diagnosis of SARS-CoV-2 by FDA under an Emergency Use Authorization (EUA). This EUA will remain  in effect (meaning this test can be used) for the duration of the COVID-19 declaration under Section 564(b)(1) of the Act, 21 U.S.C. section 360bbb-3(b)(1), unless the authorization is terminated or revoked sooner.     Influenza A by PCR NEGATIVE NEGATIVE Final   Influenza B by PCR NEGATIVE NEGATIVE Final    Comment: (NOTE) The Xpert Xpress SARS-CoV-2/FLU/RSV assay is intended as an aid in  the diagnosis of influenza from Nasopharyngeal swab specimens and  should not be used as a sole basis for treatment. Nasal washings and  aspirates are unacceptable for Xpert Xpress SARS-CoV-2/FLU/RSV  testing.  Fact Sheet for Patients: [place]  Fact Sheet for Healthcare Providers: https://www.moore.com/  This test is not yet approved or cleared by the https://www.young.biz/ FDA and  has been authorized for detection and/or diagnosis of SARS-CoV-2 by  FDA under an Emergency Use Authorization (EUA). This EUA will remain  in effect (meaning this test can be used) for the duration of the  Covid-19 declaration under Section 564(b)(1) of the Act, 21  U.S.C. section 360bbb-3(b)(1), unless the authorization is  terminated or  revoked. Performed at [facility], [address], [place], [place]      Radiological Exams on Admission: DG Chest Port 1 View  Result Date: 06/24/2020 CLINICAL DATA:  79 year old male with possible sepsis. EXAM: PORTABLE CHEST 1 VIEW COMPARISON:  CT Abdomen and Pelvis 06/16/2020. FINDINGS: Portable AP semi upright view at 2022 hours. Low lung volumes. Mediastinal contours remain within normal limits. Visualized tracheal air column is within normal limits. No pneumothorax. No pleural effusion is evident. Partially visible right upper lobe airspace opacity on the recent CT is subtle radiographically. No definite confluent opacity elsewhere. Paucity  of bowel gas in the upper abdomen. Amorphous calcific density projecting over the proximal right humerus may be external artifact. No acute osseous abnormality identified. IMPRESSION: 1. Subtle opacity in the lateral right upper lobe where evidence of pneumonia was partially visible on the recent CT. 2. Low lung volumes.  No other acute cardiopulmonary abnormality. Electronically Signed   By: Odessa FlemingH  Hall M.D.   On: 06/24/2020 20:30    EKG: Independently reviewed. Sinus tachycardia  Assessment/Plan Principal Problem:   Sepsis secondary to UTI [facility](HCC) Active Problems:   Acute lower UTI     #1 sepsis secondary to UTI: Patient will be admitted. Empirically started on Rocephin. We may need to expand antibiotics coverage due to recent catheter insertion. Urine and blood cultures obtained follow the sepsis protocol.  #2 UTI: Recent Foley catheter. No prior history of UTIs. Empirically treat as community-acquired UTI for now.  #3 dementia: Agitated. Patient has soft mitts restraint. Continue monitor.  #4 recent constipation: Wife said constipation has resolved.   DVT prophylaxis: Lovenox Code Status: Full code Family Communication: Wife at bedside Disposition Plan: To be determined Consults called: None Admission status:  Inpatient  Severity of Illness: The appropriate patient status for this patient is INPATIENT. Inpatient status is judged to be reasonable and necessary in order to provide the required intensity of service to ensure the patient's safety. The patient's presenting symptoms, physical exam findings, and initial radiographic and laboratory data in the context of their chronic comorbidities is felt to place them at high risk for further clinical deterioration. Furthermore, it is not anticipated that the patient will be medically stable for discharge from the hospital within 2 midnights of admission. The following factors support the patient status of inpatient.   " The patient's presenting symptoms include altered mental status and fever. " The worrisome physical exam findings include confusion. " The initial radiographic and laboratory data are worrisome because of sepsis and UTI. " The chronic co-morbidities include dementia.   * I certify that at the point of admission it is my clinical judgment that the patient will require inpatient hospital care spanning beyond 2 midnights from the point of admission due to high intensity of service, high risk for further deterioration and high frequency of surveillance required.Lonia Blood*    Rihaan Barrack,LAWAL MD [facility] Pager [phone]  If 7PM-7AM, please contact night-coverage [email or web address] Password [email or web address]  06/24/2020, 9:10 PM

## 2020-06-24 NOTE — ED Triage Notes (Addendum)
Pt bib ems from home following concern for sepsis. Pt had indwelling cath removed yesterday. Urine from cath was negative for UTI. Per EMS, urine they saw today was cloudy and malodorous. Pt's wife told EMS pt is "out of his normal" Has hz of dementia. Pt agitated in triage, spitting and trying to leave hospital bed.   Given of NS and 2.5mg  haldol with EMS  EMS vitals 142/74 HR 128 104.21f 95% RA

## 2020-06-24 NOTE — ED Provider Notes (Signed)
[facility]-EMERGENCY DEPT Provider Note   CSN: [id number] Arrival date & time: 06/24/20  1747     History Chief Complaint  Patient presents with  . Code Sepsis  . Altered Mental Status    William Bishop is a 79 y.o. male.  HPI He presents from home for evaluation of an acute illness. He is unable to give any history. EMS treated him with IV fluids, and Haldol for transport.  Level 5 caveat-acute illness.    Past Medical History:  Diagnosis Date  . Dementia (HCC)   . Renal disorder     Patient Active Problem List   Diagnosis Date Noted  . Cholecystitis with cholelithiasis 11/03/2018    Past Surgical History:  Procedure Laterality Date  . arm fracture surgery    . CHOLECYSTECTOMY N/A 11/06/2018   Procedure: LAPAROSCOPIC CHOLECYSTECTOMY WITH INTRAOPERATIVE CHOLANGIOGRAM;  Surgeon: Ovidio Kin, MD;  Location: [facility] ORS;  Service: General;  Laterality: N/A;       Family History  Family history unknown: Yes    Social History   Tobacco Use  . Smoking status: Never Smoker  . Smokeless tobacco: Former Neurosurgeon    Types: Engineer, drilling  . Vaping Use: Never used  Substance Use Topics  . Alcohol use: Never  . Drug use: Never    Home Medications Prior to Admission medications   Medication Sig Start Date End Date Taking? Authorizing Provider  Calcium Carbonate (CALCIUM 500 PO) Take 2 tablets by mouth daily.   Yes [provider]  Cholecalciferol (VITAMIN D3) 1.25 MG (50000 UT) TABS Take 2 tablets by mouth daily.   Yes [provider]  co-enzyme Q-10 30 MG capsule Take 30 mg by mouth daily.   Yes [provider]  doxycycline (VIBRAMYCIN) 100 MG capsule Take 1 capsule (100 mg total) by mouth 2 (two) times daily. 06/16/20  Yes Petrucelli, Samantha R, PA-C  isosorbide mononitrate (IMDUR) 30 MG 24 hr tablet Take 30 mg by mouth daily. 05/20/20  Yes [provider]  KRILL OIL PO Take 1 tablet by mouth daily.   Yes  [provider]  Misc Natural Products (TOTAL CARDIO HEALTH FORMULA PO) Take 2 tablets by mouth daily. CARDIO BP- Supplement   Yes [provider]  OVER THE COUNTER MEDICATION Take 1 tablet by mouth daily. Immune 5 supplement   Yes [provider]  OVER THE COUNTER MEDICATION Take 1 tablet by mouth daily. Brain Elevate Supplement   Yes [provider]  QUEtiapine (SEROQUEL) 25 MG tablet Take 50 mg by mouth at bedtime.  05/15/20  Yes [provider]  vitamin C (ASCORBIC ACID) 250 MG tablet Take 250 mg by mouth daily.   Yes [provider]  polyethylene glycol (MIRALAX) 17 g packet Take 17 g by mouth daily as needed for moderate constipation. 06/16/20   Petrucelli, Samantha R, PA-C  traMADol (ULTRAM) 50 MG tablet Take 1 tablet (50 mg total) by mouth every 6 (six) hours as needed for severe pain. 11/07/18   Juliet Rude, PA-C    Allergies    Patient has no known allergies.  Review of Systems   Review of Systems  Unable to perform ROS: Mental status change    Physical Exam Updated Vital Signs BP 103/71   Pulse (!) 113   Temp (!) 102 F (38.9 C) (Rectal)   Resp (!) 27   Ht 5\' 6"  (1.676 m)   Wt 95.3 kg   SpO2 94%  BMI 33.89 kg/m   Physical Exam Vitals and nursing note reviewed.  Constitutional:      General: He is in acute distress.     Appearance: He is well-developed. He is ill-appearing. He is not toxic-appearing or diaphoretic.  HENT:     Head: Normocephalic and atraumatic.     Right Ear: External ear normal.     Left Ear: External ear normal.  Eyes:     Conjunctiva/sclera: Conjunctivae normal.     Pupils: Pupils are equal, round, and reactive to light.  Neck:     Trachea: Phonation normal.  Cardiovascular:     Rate and Rhythm: Regular rhythm. Tachycardia present.     Heart sounds: Normal heart sounds.  Pulmonary:     Effort: Pulmonary effort is normal.     Breath sounds: Normal breath sounds.  Abdominal:      General: There is no distension.     Palpations: Abdomen is soft.     Tenderness: There is no abdominal tenderness.  Musculoskeletal:        General: Normal range of motion.     Cervical back: Normal range of motion and neck supple.  Skin:    General: Skin is warm.     Comments: Diaphoretic  Neurological:     Mental Status: He is alert.     Cranial Nerves: No cranial nerve deficit.     Motor: No abnormal muscle tone.     Coordination: Coordination normal.     Comments: Confused  Psychiatric:     Comments: Restless     ED Results / Procedures / Treatments   Labs (all labs ordered are listed, but only abnormal results are displayed) Labs Reviewed  LACTIC ACID, PLASMA - Abnormal; Notable for the following components:      Result Value   Lactic Acid, Venous 3.6 (*)    All other components within normal limits  LACTIC ACID, PLASMA - Abnormal; Notable for the following components:   Lactic Acid, Venous 2.6 (*)    All other components within normal limits  COMPREHENSIVE METABOLIC PANEL - Abnormal; Notable for the following components:   CO2 21 (*)    Creatinine, Ser 1.35 (*)    AST 52 (*)    Total Bilirubin 1.7 (*)    GFR, Estimated 53 (*)    All other components within normal limits  CBC WITH DIFFERENTIAL/PLATELET - Abnormal; Notable for the following components:   Neutro Abs 7.8 (*)    Lymphs Abs 0.5 (*)    All other components within normal limits  URINALYSIS, ROUTINE W REFLEX MICROSCOPIC - Abnormal; Notable for the following components:   APPearance HAZY (*)    Hgb urine dipstick MODERATE (*)    Leukocytes,Ua LARGE (*)    WBC, UA >50 (*)    Bacteria, UA RARE (*)    All other components within normal limits  BLOOD GAS, VENOUS - Abnormal; Notable for the following components:   pH, Ven 7.438 (*)    pCO2, Ven 34.4 (*)    All other components within normal limits  RESPIRATORY PANEL BY RT PCR (FLU A&B, COVID)  CULTURE, BLOOD (ROUTINE X 2)  CULTURE, BLOOD (ROUTINE X 2)   URINE CULTURE  PROTIME-INR  APTT    EKG EKG Interpretation  Date/Time:  Wednesday June 24 2020 18:11:51 EST Ventricular Rate:  137 PR Interval:    QRS Duration: 85 QT Interval:  274 QTC Calculation: 414 R Axis:   35 Text Interpretation: Sinus tachycardia Prolonged PR  interval Consider left atrial enlargement Baseline wander in lead(s) II III aVR aVF V4 V5 V6 Since last tracing rate faster Otherwise no significant change Confirmed by Mancel Bale [phone]) on 06/24/2020 8:18:38 PM   Radiology DG Chest Port 1 View  Result Date: 06/24/2020 CLINICAL DATA:  79 year old male with possible sepsis. EXAM: PORTABLE CHEST 1 VIEW COMPARISON:  CT Abdomen and Pelvis 06/16/2020. FINDINGS: Portable AP semi upright view at 2022 hours. Low lung volumes. Mediastinal contours remain within normal limits. Visualized tracheal air column is within normal limits. No pneumothorax. No pleural effusion is evident. Partially visible right upper lobe airspace opacity on the recent CT is subtle radiographically. No definite confluent opacity elsewhere. Paucity of bowel gas in the upper abdomen. Amorphous calcific density projecting over the proximal right humerus may be external artifact. No acute osseous abnormality identified. IMPRESSION: 1. Subtle opacity in the lateral right upper lobe where evidence of pneumonia was partially visible on the recent CT. 2. Low lung volumes.  No other acute cardiopulmonary abnormality. Electronically Signed   By: Odessa Fleming M.D.   On: 06/24/2020 20:30    Procedures .Critical Care Performed by: Mancel Bale, MD Authorized by: Mancel Bale, MD   Critical care provider statement:    Critical care time (minutes):  40   Critical care start time:  06/24/2020 5:50 PM   Critical care end time:  06/24/2020 8:57 PM   Critical care time was exclusive of:  Separately billable procedures and treating other patients   Critical care was necessary to treat or prevent imminent or  life-threatening deterioration of the following conditions:  Sepsis   Critical care was time spent personally by me on the following activities:  Blood draw for specimens, development of treatment plan with patient or surrogate, discussions with consultants, evaluation of patient's response to treatment, examination of patient, obtaining history from patient or surrogate, ordering and performing treatments and interventions, ordering and review of laboratory studies, pulse oximetry, re-evaluation of patient's condition, review of old charts and ordering and review of radiographic studies   (including critical care time)  Medications Ordered in ED Medications  lactated ringers infusion ( Intravenous New Bag/Given 06/24/20 1841)  haloperidol lactate (HALDOL) injection 2 mg (has no administration in time range)  acetaminophen (TYLENOL) suppository 1,300 mg (1,300 mg Rectal Given 06/24/20 1821)  lactated ringers bolus 1,000 mL (1,000 mLs Intravenous New Bag/Given 06/24/20 1821)    And  lactated ringers bolus 1,000 mL (1,000 mLs Intravenous New Bag/Given 06/24/20 1953)    And  lactated ringers bolus 1,000 mL (1,000 mLs Intravenous New Bag/Given 06/24/20 1943)  ceFEPIme (MAXIPIME) 2 g in sodium chloride 0.9 % 100 mL IVPB (0 g Intravenous Stopped 06/24/20 1912)    ED Course  I have reviewed the triage vital signs and the nursing notes.  Pertinent labs & imaging results that were available during my care of the patient were reviewed by me and considered in my medical decision making (see chart for details).  Clinical Course as of Jun 24 2049  Wed Jun 24, 2020  2018 Patient's wife now in room, and states that patient was well until about 4 PM, when he began to have shaking chills. EMS was summoned and treated him and transferred him here. Yesterday he had a Foley catheter removed, and apparently had his urine checked at the office at that time. Patient was able to eat earlier today and ambulate  without difficulty. Patient's wife states he is a little more confused than usual  but does have chronic dementia.   [EW]    Clinical Course User Index [EW] Mancel Bale, MD   MDM Rules/Calculators/A&P                           Patient Vitals for the past 24 hrs:  BP Temp Temp src Pulse Resp SpO2 Height Weight  06/24/20 2036 103/71 -- -- (!) 113 (!) 27 94 % -- --  06/24/20 1941 112/76 -- -- (!) 125 (!) 24 94 % -- --  06/24/20 1905 -- (!) 102 F (38.9 C) Rectal -- -- -- -- --  06/24/20 1900 (!) 140/104 -- -- (!) 128 (!) 31 100 % -- --  06/24/20 1855 -- (!) 102 F (38.9 C) Rectal -- -- -- -- --  06/24/20 1831 112/72 (!) 105.1 F (40.6 C) Rectal (!) 135 (!) 28 93 % -- --  06/24/20 1826 -- -- -- -- -- -- 5\' 6"  (1.676 m) 95.3 kg  06/24/20 1755 115/70 (!) 105 F (40.6 C) Rectal (!) 129 (!) 26 94 % -- --    8:53 PM Reevaluation with update and discussion. After initial assessment and treatment, an updated evaluation reveals he is resting comfortably at this time. Findings discussed with wife and all questions were answered. 06/26/20   Medical Decision Making:  This patient is presenting for evaluation of acute febrile illness, which does require a range of treatment options, and is a complaint that involves a high risk of morbidity and mortality. The differential diagnoses include sepsis, UTI, metabolic disorder. I decided to review old records, and in summary patient recently catheterized, Foley catheter removed yesterday, presenting with sepsis.  I obtained additional historical information from wife at the bedside.  Clinical Laboratory Tests Ordered, included CBC, Metabolic panel, Urinalysis and Blood culture, lactate, venous blood gas. Review indicates chest x-ray. Radiologic Tests Ordered, included chest x-ray.  I independently Visualized: Radiographic images, which shows probable resolving infiltrate, per radiology report  Cardiac Monitor Tracing which shows sinus  tachycardia   Critical Interventions-clinical evaluation, laboratory testing, radiography, empiric treatment for sepsis with fluids and antibiotics, observation reassessment  After These Interventions, the Patient was reevaluated and was found with sepsis, blood pressure controlled, improving with fluids. He requires hospitalization for likely urinary tract infection. Creatinine relatively stable, slightly higher than 9 days ago. Doubt pneumonia. Patient was treated with doxycycline, following ED evaluation, on 06/16/2020. He was discharged at that time is been recovering at home.  CRITICAL CARE-yes Performed by: 13/04/2020  Nursing Notes Reviewed/ Care Coordinated Applicable Imaging Reviewed Interpretation of Laboratory Data incorporated into ED treatment   8:58 PM-Consult complete with hospitalist. Patient case explained and discussed. He agrees to admit patient for further evaluation and treatment. Call ended at 9:00 PM  Plan: Admit    Final Clinical Impression(s) / ED Diagnoses Final diagnoses:  Sepsis without acute organ dysfunction, due to unspecified organism [facility])    Rx / DC Orders ED Discharge Orders    None       [facility], MD 06/24/20 2100

## 2020-06-24 NOTE — Progress Notes (Signed)
A consult was received from an ED physician for cefepime per pharmacy dosing.  The patient's profile has been reviewed for ht/wt/allergies/indication/available labs.   A one time order has been placed for cefepime 2g.  Further antibiotics/pharmacy consults should be ordered by admitting physician if indicated.                       Thank you, Berkley Harvey 06/24/2020  6:12 PM

## 2020-06-24 NOTE — Sepsis Progress Note (Signed)
Notified bedside nurse of need to administer antibiotics.  

## 2020-06-25 DIAGNOSIS — A419 Sepsis, unspecified organism: Secondary | ICD-10-CM | POA: Diagnosis not present

## 2020-06-25 DIAGNOSIS — N390 Urinary tract infection, site not specified: Secondary | ICD-10-CM | POA: Diagnosis not present

## 2020-06-25 LAB — PROCALCITONIN: Procalcitonin: 36.23 ng/mL

## 2020-06-25 LAB — CBC
HCT: 37.3 % — ABNORMAL LOW (ref 39.0–52.0)
Hemoglobin: 13.4 g/dL (ref 13.0–17.0)
MCH: 33.5 pg (ref 26.0–34.0)
MCHC: 35.9 g/dL (ref 30.0–36.0)
MCV: 93.3 fL (ref 80.0–100.0)
Platelets: 128 10*3/uL — ABNORMAL LOW (ref 150–400)
RBC: 4 MIL/uL — ABNORMAL LOW (ref 4.22–5.81)
RDW: 13.5 % (ref 11.5–15.5)
WBC: 18.6 10*3/uL — ABNORMAL HIGH (ref 4.0–10.5)
nRBC: 0 % (ref 0.0–0.2)

## 2020-06-25 LAB — BLOOD CULTURE ID PANEL (REFLEXED) - BCID2

## 2020-06-25 LAB — COMPREHENSIVE METABOLIC PANEL
ALT: 65 U/L — ABNORMAL HIGH (ref 0–44)
AST: 102 U/L — ABNORMAL HIGH (ref 15–41)
Albumin: 3.3 g/dL — ABNORMAL LOW (ref 3.5–5.0)
Alkaline Phosphatase: 98 U/L (ref 38–126)
Anion gap: 13 (ref 5–15)
BUN: 18 mg/dL (ref 8–23)
CO2: 20 mmol/L — ABNORMAL LOW (ref 22–32)
Calcium: 8.7 mg/dL — ABNORMAL LOW (ref 8.9–10.3)
Chloride: 104 mmol/L (ref 98–111)
Creatinine, Ser: 1.12 mg/dL (ref 0.61–1.24)
GFR, Estimated: >60 mL/min (ref >60)
Glucose, Bld: 103 mg/dL — ABNORMAL HIGH (ref 70–99)
Potassium: 3.8 mmol/L (ref 3.5–5.1)
Sodium: 137 mmol/L (ref 135–145)
Total Bilirubin: 1.7 mg/dL — ABNORMAL HIGH (ref 0.3–1.2)
Total Protein: 6 g/dL — ABNORMAL LOW (ref 6.5–8.1)

## 2020-06-25 LAB — CORTISOL-AM, BLOOD: Cortisol - AM: 25 ug/dL — ABNORMAL HIGH (ref 6.7–22.6)

## 2020-06-25 LAB — PROTIME-INR
INR: 1.3 — ABNORMAL HIGH (ref 0.8–1.2)
Prothrombin Time: 15.8 seconds — ABNORMAL HIGH (ref 11.4–15.2)

## 2020-06-25 MED ORDER — LACTATED RINGERS IV SOLN: INTRAVENOUS | Status: DC

## 2020-06-25 MED ORDER — ONDANSETRON HCL 4 MG/2ML IJ SOLN
4.0000 mg | Freq: Four times a day (QID) | INTRAMUSCULAR | Status: DC | PRN
  Administered 2020-06-29 – 2020-07-01 (×4): 4 mg via INTRAVENOUS
  Filled 2020-06-25 (×4): qty 2

## 2020-06-25 MED ORDER — SODIUM CHLORIDE 0.9 % IV SOLN
2.0000 g | Freq: Three times a day (TID) | INTRAVENOUS | Status: DC
  Administered 2020-06-25: 2 g via INTRAVENOUS
  Filled 2020-06-25 (×2): qty 2

## 2020-06-25 MED ORDER — ACETAMINOPHEN 325 MG PO TABS
650.0000 mg | ORAL_TABLET | Freq: Four times a day (QID) | ORAL | Status: DC | PRN
  Administered 2020-06-27 – 2020-06-29 (×3): 650 mg via ORAL
  Filled 2020-06-25 (×4): qty 2

## 2020-06-25 MED ORDER — CHLORHEXIDINE GLUCONATE CLOTH 2 % EX PADS
6.0000 | MEDICATED_PAD | Freq: Every day | CUTANEOUS | Status: DC
  Administered 2020-06-26 – 2020-07-08 (×12): 6 via TOPICAL

## 2020-06-25 MED ORDER — SODIUM CHLORIDE 0.9 % IV SOLN
2.0000 g | Freq: Three times a day (TID) | INTRAVENOUS | Status: DC
  Administered 2020-06-25 – 2020-06-27 (×6): 2 g via INTRAVENOUS
  Filled 2020-06-25 (×7): qty 2

## 2020-06-25 MED ORDER — ONDANSETRON HCL 4 MG PO TABS: 4.0000 mg | ORAL_TABLET | Freq: Four times a day (QID) | ORAL | Status: DC | PRN

## 2020-06-25 MED ORDER — SODIUM CHLORIDE 0.9 % IV SOLN
1.0000 g | INTRAVENOUS | Status: DC
  Administered 2020-06-25: 1 g via INTRAVENOUS
  Filled 2020-06-25: qty 1

## 2020-06-25 MED ORDER — ENOXAPARIN SODIUM 40 MG/0.4ML ~~LOC~~ SOLN
40.0000 mg | SUBCUTANEOUS | Status: DC
  Administered 2020-06-25 – 2020-07-01 (×7): 40 mg via SUBCUTANEOUS
  Filled 2020-06-25 (×6): qty 0.4

## 2020-06-25 MED ORDER — QUETIAPINE FUMARATE 25 MG PO TABS
50.0000 mg | ORAL_TABLET | Freq: Once | ORAL | Status: AC
  Administered 2020-06-25: 50 mg via ORAL
  Filled 2020-06-25: qty 2

## 2020-06-25 MED ORDER — ACETAMINOPHEN 650 MG RE SUPP: 650.0000 mg | Freq: Four times a day (QID) | RECTAL | Status: DC | PRN

## 2020-06-25 NOTE — Progress Notes (Signed)
PROGRESS NOTE    William Bishop  IRC:[id number] DOB: [birth year] DOA: 06/24/2020 PCP: [name], MD    Brief Narrative:   79 y.o. male with medical history significant of Dementia, Urinary retention, recent constipation who has recent visit to the ER on November 8 where he had acute urinary retention. Patient had a Foley catheter inserted and then removed days later. He came back with increased confusion and fevers. In the ED, pt was found to be septic with UTI.   Assessment & Plan:   Principal Problem:   Sepsis secondary to UTI [facility]) Active Problems:   Acute lower UTI   #1 Severe sepsis secondary to Enterobacter UTI and bacteremia:  -Presented with fever, leukocytosis, acute encephalopathy, ARF with lactic acidosis -UA suggests UTI -Cultures are pending, however blood cx and urine cx thus far pos for gm neg species, pending speciation and sensitivities -Pt is continued on cefepime -Lactate is improving -Cont abx and repeat cbc in AM  #2 UTI secondary to foley cath -Recently required foley cath, since removed prior to admit -Overnight and this AM, pt is continuing to retain urine, requiring I/o cath -If pt continues to require I/O cath >24hrs, then would place indwelling cath and have pt f/u with Urology -cont abx per above  #3 Toxic metabolic encephalopathy with underlying dementia:  -Noted to be confused at time of presentation -Currently in hand mits -Cont tx for above infection  #4 recent constipation: -Resolved per family . DVT prophylaxis: Lovenox subq Code Status: Full Family Communication: Pt in room, pt's wife at bedside  Status is: Inpatient  Remains inpatient appropriate because:Unsafe d/c plan and IV treatments appropriate due to intensity of illness or inability to take PO   Dispo: The patient is from: Home              Anticipated d/c is to: Pending PT eval              Anticipated d/c date is: 2 days              Patient currently is not  medically stable to d/c.       Consultants:     Procedures:     Antimicrobials: Anti-infectives (From admission, onward)   Start     Dose/Rate Route Frequency Ordered Stop   06/25/20 0930  ceFEPIme (MAXIPIME) 2 g in sodium chloride 0.9 % 100 mL IVPB        2 g 200 mL/hr over 30 Minutes Intravenous Every 8 hours 06/25/20 0854     06/25/20 0114  cefTRIAXone (ROCEPHIN) 1 g in sodium chloride 0.9 % 100 mL IVPB  Status:  Discontinued        1 g 200 mL/hr over 30 Minutes Intravenous Every 24 hours 06/25/20 0114 06/25/20 0854   06/24/20 1815  ceFEPIme (MAXIPIME) 2 g in sodium chloride 0.9 % 100 mL IVPB        2 g 200 mL/hr over 30 Minutes Intravenous  Once 06/24/20 1813 06/24/20 1912       Subjective: Confused this AM, unable to assess  Objective: Vitals:   06/25/20 0115 06/25/20 0116 06/25/20 0500 06/25/20 0843  BP: (!) 129/99 (!) 129/99 139/85 (!) 137/92  Pulse: (!) 108 (!) 108 83 91  Resp: 20 20 20 20   Temp: 98.1 F (36.7 C) 98.1 F (36.7 C) 98.1 F (36.7 C) 98.1 F (36.7 C)  TempSrc: Oral Oral Oral Oral  SpO2: 98% 98% 99% 97%  Weight:  100.3  kg    Height:  5\' 6"  (1.676 m)      Intake/Output Summary (Last 24 hours) at 06/25/2020 1349 Last data filed at 06/25/2020 0915 Gross per 24 hour  Intake 3649.79 ml  Output 1700 ml  Net 1949.79 ml   Filed Weights   06/24/20 1826 06/25/20 0116  Weight: 95.3 kg 100.3 kg    Examination:  General exam: Appears calm and comfortable  Respiratory system: Clear to auscultation. Respiratory effort normal. Cardiovascular system: S1 & S2 heard, Regular Gastrointestinal system: Abdomen is nondistended, soft and nontender. No organomegaly or masses felt. Normal bowel sounds heard. Central nervous system: Alert. No focal neurological deficits. Extremities: Symmetric 5 x 5 power. Skin: No rashes, lesions Psychiatry: Unable to assess given current mentation   Data Reviewed: I have personally reviewed following labs and  imaging studies  CBC: Recent Labs  Lab 06/24/20 1807 06/25/20 0544  WBC 8.5 18.6*  NEUTROABS 7.8*  --   HGB 14.6 13.4  HCT 41.5 37.3*  MCV 93.9 93.3  PLT 174 128*   Basic Metabolic Panel: Recent Labs  Lab 06/24/20 1807 06/25/20 0544  NA 138 137  K 3.8 3.8  CL 105 104  CO2 21* 20*  GLUCOSE 96 103*  BUN 22 18  CREATININE 1.35* 1.12  CALCIUM 9.3 8.7*   GFR: Estimated Creatinine Clearance: 59.3 mL/min (by C-G formula based on SCr of 1.12 mg/dL). Liver Function Tests: Recent Labs  Lab 06/24/20 1807 06/25/20 0544  AST 52* 102*  ALT 37 65*  ALKPHOS 117 98  BILITOT 1.7* 1.7*  PROT 7.1 6.0*  ALBUMIN 4.2 3.3*   No results for input(s): LIPASE, AMYLASE in the last 168 hours. No results for input(s): AMMONIA in the last 168 hours. Coagulation Profile: Recent Labs  Lab 06/24/20 1807 06/25/20 0544  INR 1.1 1.3*   Cardiac Enzymes: No results for input(s): CKTOTAL, CKMB, CKMBINDEX, TROPONINI in the last 168 hours. BNP (last 3 results) No results for input(s): PROBNP in the last 8760 hours. HbA1C: No results for input(s): HGBA1C in the last 72 hours. CBG: No results for input(s): GLUCAP in the last 168 hours. Lipid Profile: No results for input(s): CHOL, HDL, LDLCALC, TRIG, CHOLHDL, LDLDIRECT in the last 72 hours. Thyroid Function Tests: No results for input(s): TSH, T4TOTAL, FREET4, T3FREE, THYROIDAB in the last 72 hours. Anemia Panel: No results for input(s): VITAMINB12, FOLATE, FERRITIN, TIBC, IRON, RETICCTPCT in the last 72 hours. Sepsis Labs: Recent Labs  Lab 06/24/20 1807 06/24/20 1955 06/25/20 0544  PROCALCITON  --   --  36.23  LATICACIDVEN 3.6* 2.6*  --     Recent Results (from the past 240 hour(s))  Blood Culture (routine x 2)     Status: None (Preliminary result)   Collection Time: 06/24/20  6:07 PM   Specimen: BLOOD  Result Value Ref Range Status   Specimen Description   Final    BLOOD LEFT ANTECUBITAL Performed at [facility], [address] [place], [place]    Special Requests   Final    BOTTLES DRAWN AEROBIC AND ANAEROBIC Blood Culture adequate volume Performed at [facility], [address] [place], [place]    Culture  Setup Time   Final    GRAM NEGATIVE RODS IN BOTH AEROBIC AND ANAEROBIC BOTTLES CRITICAL VALUE NOTED.  VALUE IS CONSISTENT WITH PREVIOUSLY REPORTED AND CALLED VALUE. Performed at [facility], [address], [place], [place]    Culture  GRAM NEGATIVE RODS  Final   Report Status PENDING  Incomplete  Blood Culture (routine x 2)     Status: None (Preliminary result)   Collection Time: 06/24/20  6:12 PM   Specimen: BLOOD  Result Value Ref Range Status   Specimen Description   Final    BLOOD RIGHT ANTECUBITAL Performed at [facility], [address], [place], [place] [address]    Special Requests   Final    BOTTLES DRAWN AEROBIC AND ANAEROBIC Blood Culture adequate volume Performed at [facility], [address] [place], [place] [address]    Culture  Setup Time   Final    IN BOTH AEROBIC AND ANAEROBIC BOTTLES GRAM NEGATIVE RODS CRITICAL RESULT CALLED TO, READ BACK BY AND VERIFIED WITH: Kathrynn Speed [phone] [id number] FCP Performed at [facility], [address], [place], [place] [address]    Culture GRAM NEGATIVE RODS  Final   Report Status PENDING  Incomplete  Blood Culture ID Panel (Reflexed)     Status: Abnormal   Collection Time: 06/24/20  6:12 PM  Result Value Ref Range Status   Enterococcus faecalis NOT DETECTED NOT DETECTED Final   Enterococcus Faecium NOT DETECTED NOT DETECTED Final   Listeria monocytogenes NOT DETECTED NOT DETECTED Final   Staphylococcus species NOT DETECTED NOT DETECTED Final   Staphylococcus aureus (BCID) NOT DETECTED NOT DETECTED Final   Staphylococcus epidermidis NOT DETECTED NOT DETECTED Final   Staphylococcus lugdunensis NOT DETECTED NOT  DETECTED Final   Streptococcus species NOT DETECTED NOT DETECTED Final   Streptococcus agalactiae NOT DETECTED NOT DETECTED Final   Streptococcus pneumoniae NOT DETECTED NOT DETECTED Final   Streptococcus pyogenes NOT DETECTED NOT DETECTED Final   A.calcoaceticus-baumannii NOT DETECTED NOT DETECTED Final   Bacteroides fragilis NOT DETECTED NOT DETECTED Final   Enterobacterales DETECTED (A) NOT DETECTED Final    Comment: Enterobacterales represent a large order of gram negative bacteria, not a single organism. Refer to culture for further identification. CRITICAL RESULT CALLED TO, READ BACK BY AND VERIFIED WITH: PHARMD M MICHAELS [id number] FCP    Enterobacter cloacae complex NOT DETECTED NOT DETECTED Final   Escherichia coli NOT DETECTED NOT DETECTED Final   Klebsiella aerogenes NOT DETECTED NOT DETECTED Final   Klebsiella oxytoca NOT DETECTED NOT DETECTED Final   Klebsiella pneumoniae NOT DETECTED NOT DETECTED Final   Proteus species NOT DETECTED NOT DETECTED Final   Salmonella species NOT DETECTED NOT DETECTED Final   Serratia marcescens NOT DETECTED NOT DETECTED Final   Haemophilus influenzae NOT DETECTED NOT DETECTED Final   Neisseria meningitidis NOT DETECTED NOT DETECTED Final   Pseudomonas aeruginosa NOT DETECTED NOT DETECTED Final   Stenotrophomonas maltophilia NOT DETECTED NOT DETECTED Final   Candida albicans NOT DETECTED NOT DETECTED Final   Candida auris NOT DETECTED NOT DETECTED Final   Candida glabrata NOT DETECTED NOT DETECTED Final   Candida krusei NOT DETECTED NOT DETECTED Final   Candida parapsilosis NOT DETECTED NOT DETECTED Final   Candida tropicalis NOT DETECTED NOT DETECTED Final   Cryptococcus neoformans/gattii NOT DETECTED NOT DETECTED Final   CTX-M ESBL NOT DETECTED NOT DETECTED Final   Carbapenem resistance IMP NOT DETECTED NOT DETECTED Final   Carbapenem resistance KPC NOT DETECTED NOT DETECTED Final   Carbapenem resistance NDM NOT DETECTED NOT DETECTED  Final   Carbapenem resist OXA 48 LIKE NOT DETECTED NOT DETECTED Final   Carbapenem resistance VIM NOT DETECTED NOT DETECTED Final    Comment:  Performed at [facility], [address] [place], [place] [address]  Respiratory Panel by RT PCR (Flu A&B, Covid) - Nasopharyngeal Swab     Status: None   Collection Time: 06/24/20  6:26 PM   Specimen: Nasopharyngeal Swab  Result Value Ref Range Status   SARS Coronavirus 2 by RT PCR NEGATIVE NEGATIVE Final    Comment: (NOTE) SARS-CoV-2 target nucleic acids are NOT DETECTED.  The SARS-CoV-2 RNA is generally detectable in upper respiratoy specimens during the acute phase of infection. The lowest concentration of SARS-CoV-2 viral copies this assay can detect is 131 copies/mL. A negative result does not preclude SARS-Cov-2 infection and should not be used as the sole basis for treatment or other patient management decisions. A negative result may occur with  improper specimen collection/handling, submission of specimen other than nasopharyngeal swab, presence of viral mutation(s) within the areas targeted by this assay, and inadequate number of viral copies (<131 copies/mL). A negative result must be combined with clinical observations, patient history, and epidemiological information. The expected result is Negative.  Fact Sheet for Patients:  https://www.moore.com/  Fact Sheet for Healthcare Providers:  https://www.young.biz/  This test is no t yet approved or cleared by the [place] FDA and  has been authorized for detection and/or diagnosis of SARS-CoV-2 by FDA under an Emergency Use Authorization (EUA). This EUA will remain  in effect (meaning this test can be used) for the duration of the COVID-19 declaration under Section 564(b)(1) of the Act, 21 U.S.C. section 360bbb-3(b)(1), unless the authorization is terminated or revoked sooner.     Influenza A by PCR NEGATIVE NEGATIVE Final    Influenza B by PCR NEGATIVE NEGATIVE Final    Comment: (NOTE) The Xpert Xpress SARS-CoV-2/FLU/RSV assay is intended as an aid in  the diagnosis of influenza from Nasopharyngeal swab specimens and  should not be used as a sole basis for treatment. Nasal washings and  aspirates are unacceptable for Xpert Xpress SARS-CoV-2/FLU/RSV  testing.  Fact Sheet for Patients: https://www.moore.com/  Fact Sheet for Healthcare Providers: https://www.young.biz/  This test is not yet approved or cleared by the [place] FDA and  has been authorized for detection and/or diagnosis of SARS-CoV-2 by  FDA under an Emergency Use Authorization (EUA). This EUA will remain  in effect (meaning this test can be used) for the duration of the  Covid-19 declaration under Section 564(b)(1) of the Act, 21  U.S.C. section 360bbb-3(b)(1), unless the authorization is  terminated or revoked. Performed at [facility], [address] [place], [place] [address]      Radiology Studies: [facility] Chest Port 1 View  Result Date: 06/24/2020 CLINICAL DATA:  79 year old male with possible sepsis. EXAM: PORTABLE CHEST 1 VIEW COMPARISON:  CT Abdomen and Pelvis 06/16/2020. FINDINGS: Portable AP semi upright view at 2022 hours. Low lung volumes. Mediastinal contours remain within normal limits. Visualized tracheal air column is within normal limits. No pneumothorax. No pleural effusion is evident. Partially visible right upper lobe airspace opacity on the recent CT is subtle radiographically. No definite confluent opacity elsewhere. Paucity of bowel gas in the upper abdomen. Amorphous calcific density projecting over the proximal right humerus may be external artifact. No acute osseous abnormality identified. IMPRESSION: 1. Subtle opacity in the lateral right upper lobe where evidence of pneumonia was partially visible on the recent CT. 2. Low lung volumes.  No other acute  cardiopulmonary abnormality. Electronically Signed   By: Odessa Fleming M.D.   On: 06/24/2020  20:30    Scheduled Meds: . enoxaparin (LOVENOX) injection  40 mg Subcutaneous Q24H  . haloperidol lactate  2 mg Intravenous Once   Continuous Infusions: . ceFEPime (MAXIPIME) IV 2 g (06/25/20 0947)  . lactated ringers 150 mL/hr at 06/24/20 1841  . lactated ringers 125 mL/hr at 06/25/20 0120     LOS: 1 day   Rickey BarbaraStephen Deng Kemler, MD [facility] Pager On Amion  If 7PM-7AM, please contact night-coverage 06/25/2020, 1:49 PM

## 2020-06-25 NOTE — Progress Notes (Signed)
PHARMACY - PHYSICIAN COMMUNICATION CRITICAL VALUE ALERT - BLOOD CULTURE IDENTIFICATION (BCID)  William Bishop is an 79 y.o. male who presented to [facility] on 06/24/2020 with a chief complaint of urosepsis  Assessment:  1/2 BCx (2/4 bottles with unidentified enterobacterales (suspect urinary source)  Name of physician (or Provider) Contacted: [name]  Current antibiotics: Rocephin  Changes to prescribed antibiotics recommended: Cefepime Recommendations accepted by provider  Results for orders placed or performed during the hospital encounter of 06/24/20  Blood Culture ID Panel (Reflexed) (Collected: 06/24/2020  6:12 PM)  Result Value Ref Range   Enterococcus faecalis NOT DETECTED NOT DETECTED   Enterococcus Faecium NOT DETECTED NOT DETECTED   Listeria monocytogenes NOT DETECTED NOT DETECTED   Staphylococcus species NOT DETECTED NOT DETECTED   Staphylococcus aureus (BCID) NOT DETECTED NOT DETECTED   Staphylococcus epidermidis NOT DETECTED NOT DETECTED   Staphylococcus lugdunensis NOT DETECTED NOT DETECTED   Streptococcus species NOT DETECTED NOT DETECTED   Streptococcus agalactiae NOT DETECTED NOT DETECTED   Streptococcus pneumoniae NOT DETECTED NOT DETECTED   Streptococcus pyogenes NOT DETECTED NOT DETECTED   A.calcoaceticus-baumannii NOT DETECTED NOT DETECTED   Bacteroides fragilis NOT DETECTED NOT DETECTED   Enterobacterales DETECTED (A) NOT DETECTED   Enterobacter cloacae complex NOT DETECTED NOT DETECTED   Escherichia coli NOT DETECTED NOT DETECTED   Klebsiella aerogenes NOT DETECTED NOT DETECTED   Klebsiella oxytoca NOT DETECTED NOT DETECTED   Klebsiella pneumoniae NOT DETECTED NOT DETECTED   Proteus species NOT DETECTED NOT DETECTED   Salmonella species NOT DETECTED NOT DETECTED   Serratia marcescens NOT DETECTED NOT DETECTED   Haemophilus influenzae NOT DETECTED NOT DETECTED   Neisseria meningitidis NOT DETECTED NOT DETECTED   Pseudomonas aeruginosa NOT DETECTED NOT  DETECTED   Stenotrophomonas maltophilia NOT DETECTED NOT DETECTED   Candida albicans NOT DETECTED NOT DETECTED   Candida auris NOT DETECTED NOT DETECTED   Candida glabrata NOT DETECTED NOT DETECTED   Candida krusei NOT DETECTED NOT DETECTED   Candida parapsilosis NOT DETECTED NOT DETECTED   Candida tropicalis NOT DETECTED NOT DETECTED   Cryptococcus neoformans/gattii NOT DETECTED NOT DETECTED   CTX-M ESBL NOT DETECTED NOT DETECTED   Carbapenem resistance IMP NOT DETECTED NOT DETECTED   Carbapenem resistance KPC NOT DETECTED NOT DETECTED   Carbapenem resistance NDM NOT DETECTED NOT DETECTED   Carbapenem resist OXA 48 LIKE NOT DETECTED NOT DETECTED   Carbapenem resistance VIM NOT DETECTED NOT DETECTED    [name] A 06/25/2020  8:54 AM

## 2020-06-26 DIAGNOSIS — A419 Sepsis, unspecified organism: Secondary | ICD-10-CM | POA: Diagnosis not present

## 2020-06-26 DIAGNOSIS — N390 Urinary tract infection, site not specified: Secondary | ICD-10-CM | POA: Diagnosis not present

## 2020-06-26 LAB — COMPREHENSIVE METABOLIC PANEL
ALT: 47 U/L — ABNORMAL HIGH (ref 0–44)
AST: 76 U/L — ABNORMAL HIGH (ref 15–41)
Albumin: 3.2 g/dL — ABNORMAL LOW (ref 3.5–5.0)
Alkaline Phosphatase: 85 U/L (ref 38–126)
Anion gap: 11 (ref 5–15)
BUN: 23 mg/dL (ref 8–23)
CO2: 21 mmol/L — ABNORMAL LOW (ref 22–32)
Calcium: 8.7 mg/dL — ABNORMAL LOW (ref 8.9–10.3)
Chloride: 106 mmol/L (ref 98–111)
Creatinine, Ser: 1.52 mg/dL — ABNORMAL HIGH (ref 0.61–1.24)
GFR, Estimated: 46 mL/min — ABNORMAL LOW (ref >60)
Glucose, Bld: 102 mg/dL — ABNORMAL HIGH (ref 70–99)
Potassium: 3.2 mmol/L — ABNORMAL LOW (ref 3.5–5.1)
Sodium: 138 mmol/L (ref 135–145)
Total Bilirubin: 1.7 mg/dL — ABNORMAL HIGH (ref 0.3–1.2)
Total Protein: 5.8 g/dL — ABNORMAL LOW (ref 6.5–8.1)

## 2020-06-26 LAB — URINE CULTURE: Culture: 70000 — AB

## 2020-06-26 LAB — CBC
HCT: 38.3 % — ABNORMAL LOW (ref 39.0–52.0)
Hemoglobin: 13.6 g/dL (ref 13.0–17.0)
MCH: 33.1 pg (ref 26.0–34.0)
MCHC: 35.5 g/dL (ref 30.0–36.0)
MCV: 93.2 fL (ref 80.0–100.0)
Platelets: 105 10*3/uL — ABNORMAL LOW (ref 150–400)
RBC: 4.11 MIL/uL — ABNORMAL LOW (ref 4.22–5.81)
RDW: 13.6 % (ref 11.5–15.5)
WBC: 10.9 10*3/uL — ABNORMAL HIGH (ref 4.0–10.5)
nRBC: 0 % (ref 0.0–0.2)

## 2020-06-26 MED ORDER — POTASSIUM CHLORIDE 20 MEQ PO PACK
40.0000 meq | PACK | Freq: Once | ORAL | Status: AC
  Administered 2020-06-26: 40 meq via ORAL
  Filled 2020-06-26: qty 2

## 2020-06-26 MED ORDER — POTASSIUM CHLORIDE CRYS ER 20 MEQ PO TBCR: 40.0000 meq | EXTENDED_RELEASE_TABLET | Freq: Once | ORAL | Status: DC

## 2020-06-26 NOTE — Progress Notes (Signed)
PHARMACY NOTE:  ANTIMICROBIAL RENAL DOSAGE ADJUSTMENT  Current antimicrobial regimen includes a mismatch between antimicrobial dosage and estimated renal function.  As per policy approved by the Pharmacy & Therapeutics and Medical Executive Committees, the antimicrobial dosage will be adjusted accordingly.  Current antimicrobial dosage:  Cefepime 2gm q8  Indication: BCID with unidentifed GNR, Enterobacterales Family  Renal Function:   Estimated Creatinine Clearance: 43.7 mL/min (A) (by C-G formula based on SCr of 1.52 mg/dL (H)). []      On intermittent HD, scheduled: []      On CRRT    Antimicrobial dosage has been changed to:  Cefepime 2gm q12   Additional Comments:    Thank you for allowing pharmacy to be a part of this patient's care.  PharmD 06/26/2020 8:06 AM

## 2020-06-26 NOTE — Evaluation (Signed)
Physical Therapy Evaluation Patient Details Name: William Bishop MRN: [id number] DOB: 07-Oct-1940 Today's Date: 06/26/2020   History of Present Illness  Pt is 79 yo male with PMH of significant dementia, urinary retention, and recent constipation. Pt presented with increased confusion and fevers.  Pt was found to be septic with UTI.  Clinical Impression  Pt admitted with above diagnosis. Pt's family not present and pt unable to provide history due to confusion/dementia.  Overall, pt transferred with min guard for safety.  Did have some limitations with transfers due to confusion and not following commands. He does present with mild instability and mild decreased mobility; however, suspect near baseline mobility.  If pt does not have 24 hr supervision would need placement due to cognition deficits. Pt currently with functional limitations due to the deficits listed below (see PT Problem List). Pt will benefit from skilled PT to increase their independence and safety with mobility to allow discharge to the venue listed below.       Follow Up Recommendations Supervision/Assistance - 24 hour;No PT follow up (if 24 hr supervision not available would need placement due to cognition)    Equipment Recommendations  Rolling walker with 5" wheels    Recommendations for Other Services       Precautions / Restrictions Precautions Precautions: Fall      Mobility  Bed Mobility Overal bed mobility: Needs Assistance Bed Mobility: Supine to Sit;Sit to Supine     Supine to sit: Min assist Sit to supine: Max assist;+2 for safety/equipment   General bed mobility comments: Min A hand pull to EOB; max x 2 to return to supine as pt refusing to lay down and unsafe to remain sittintg    Transfers Overall transfer level: Needs assistance Equipment used: None Transfers: Sit to/from Stand Sit to Stand: Min guard         General transfer comment: for safety  Ambulation/Gait Ambulation/Gait  assistance: Min guard Gait Distance (Feet): 250 Feet Assistive device: Rolling walker (2 wheeled) Gait Pattern/deviations: Step-through pattern     General Gait Details: min guard for safety; used RW for safety and to hold catheter but likely could do without; cues for direction  Stairs            Wheelchair Mobility    Modified Rankin (Stroke Patients Only)       Balance Overall balance assessment: Needs assistance Sitting-balance support: No upper extremity supported Sitting balance-Leahy Scale: Good     Standing balance support: No upper extremity supported Standing balance-Leahy Scale: Good                               Pertinent Vitals/Pain Pain Assessment: No/denies pain    Home Living Family/patient expects to be discharged to:: Private residence Living Arrangements: Spouse/significant other               Additional Comments: No family present and pt confused and unable to provide hx.    Prior Function           Comments: No family present and pt confused and unable to provide hx.     Hand Dominance        Extremity/Trunk Assessment   Upper Extremity Assessment Upper Extremity Assessment: Difficult to assess due to impaired cognition (no obvious deficit)    Lower Extremity Assessment Lower Extremity Assessment: Difficult to assess due to impaired cognition (no obvious deficit)    Cervical / Trunk  Assessment Cervical / Trunk Assessment: Normal  Communication   Communication: No difficulties  Cognition Arousal/Alertness: Awake/alert Behavior During Therapy: Agitated Overall Cognitive Status: No family/caregiver present to determine baseline cognitive functioning Area of Impairment: Orientation;Attention;Memory;Following commands;Safety/judgement;Awareness;Problem solving                 Orientation Level: Disoriented to;Person;Place;Time;Situation (thinks therapist trying to take him to jail at times) Current  Attention Level: Focused Memory: Decreased short-term memory Following Commands: Follows one step commands inconsistently Safety/Judgement: Decreased awareness of deficits;Decreased awareness of safety Awareness: Intellectual Problem Solving: Difficulty sequencing;Requires verbal cues;Requires tactile cues General Comments: Pt with hx of dementia.      General Comments      Exercises     Assessment/Plan    PT Assessment Patient needs continued PT services  PT Problem List Decreased mobility;Decreased safety awareness;Decreased knowledge of use of DME;Decreased balance;Decreased activity tolerance;Decreased cognition       PT Treatment Interventions DME instruction;Therapeutic activities;Gait training;Therapeutic exercise;Patient/family education;Balance training;Functional mobility training    PT Goals (Current goals can be found in the Care Plan section)  Acute Rehab PT Goals Patient Stated Goal: unable PT Goal Formulation: Patient unable to participate in goal setting Time For Goal Achievement: 07/10/20 Potential to Achieve Goals: Good    Frequency Min 3X/week   Barriers to discharge   unknown level of assist at home?    Co-evaluation               AM-PAC PT "6 Clicks" Mobility  Outcome Measure Help needed turning from your back to your side while in a flat bed without using bedrails?: A Little Help needed moving from lying on your back to sitting on the side of a flat bed without using bedrails?: A Little Help needed moving to and from a bed to a chair (including a wheelchair)?: A Little Help needed standing up from a chair using your arms (e.g., wheelchair or bedside chair)?: A Little Help needed to walk in hospital room?: A Little Help needed climbing 3-5 steps with a railing? : A Little 6 Click Score: 18    End of Session Equipment Utilized During Treatment: Gait belt Activity Tolerance: Patient tolerated treatment well Patient left: in bed;with call  bell/phone within reach;with bed alarm set Nurse Communication: Mobility status PT Visit Diagnosis: Unsteadiness on feet (R26.81)    Time: 3299-2426 PT Time Calculation (min) (ACUTE ONLY): 23 min   Charges:   PT Evaluation $PT Eval Low Complexity: 1 Low PT Treatments $Gait Training: 8-22 mins        Anise Salvo, PT Acute Rehab Services Pager [phone] [facility] [phone]    Rayetta Humphrey 06/26/2020, 2:40 PM

## 2020-06-26 NOTE — Care Management Important Message (Signed)
Important Message  Patient Details IM Letter given to the Patient. Name: William Bishop MRN: [id number] Date of Birth: 09-19-1940   Medicare Important Message Given:  Yes     Caren Macadam 06/26/2020, 11:37 AM

## 2020-06-26 NOTE — Progress Notes (Signed)
PROGRESS NOTE    William Bishop  ZOX:[id number] DOB: 06/09/1941 DOA: 06/24/2020 PCP: [name], MD    Brief Narrative:   79 y.o. male with medical history significant of Dementia, Urinary retention, recent constipation who has recent visit to the ER on November 8 where he had acute urinary retention. Patient had a Foley catheter inserted and then removed days later. He came back with increased confusion and fevers. In the ED, pt was found to be septic with UTI.   Assessment & Plan:   Principal Problem:   Sepsis secondary to UTI [facility]) Active Problems:   Acute lower UTI   #1 Severe sepsis secondary to Citrobacter freundii UTI and bacteremia:  -Presented with fever, leukocytosis, acute encephalopathy, ARF with lactic acidosis -UA suggests UTI -Cultures thus far pos for Citrobacter freundii sensitive to cefepime, rocephin,  -Pt is continued on cefepime, rocephin, ciprofloxacin, imipenem, zosyn, bactrim -Lactate improved and WBC now improving -discussed with pharmacy. Cont current abx for now  #2 UTI secondary to foley cath -Recently required foley cath, since removed prior to admit -Overnight and this AM, pt is continuing to retain urine, requiring I/o cath -If pt continues to require I/O cath >24hrs, then would place indwelling cath and have pt f/u with Urology -cont abx per above  #3 Toxic metabolic encephalopathy with underlying dementia:  -Noted to be confused at time of presentation -Currently in hand mits -Cont abx per above  #4 recent constipation: -Resolved per family . DVT prophylaxis: Lovenox subq Code Status: Full Family Communication: Pt in room, pt's wife at bedside  Status is: Inpatient  Remains inpatient appropriate because:Unsafe d/c plan and IV treatments appropriate due to intensity of illness or inability to take PO  Dispo: The patient is from: Home              Anticipated d/c is to: Pending PT eval              Anticipated d/c date is: 2  days              Patient currently is not medically stable to d/c.   Consultants:     Procedures:     Antimicrobials: Anti-infectives (From admission, onward)   Start     Dose/Rate Route Frequency Ordered Stop   06/25/20 1700  ceFEPIme (MAXIPIME) 2 g in sodium chloride 0.9 % 100 mL IVPB        2 g 200 mL/hr over 30 Minutes Intravenous Every 8 hours 06/25/20 1522     06/25/20 0930  ceFEPIme (MAXIPIME) 2 g in sodium chloride 0.9 % 100 mL IVPB  Status:  Discontinued        2 g 200 mL/hr over 30 Minutes Intravenous Every 8 hours 06/25/20 0854 06/25/20 1522   06/25/20 0114  cefTRIAXone (ROCEPHIN) 1 g in sodium chloride 0.9 % 100 mL IVPB  Status:  Discontinued        1 g 200 mL/hr over 30 Minutes Intravenous Every 24 hours 06/25/20 0114 06/25/20 0854   06/24/20 1815  ceFEPIme (MAXIPIME) 2 g in sodium chloride 0.9 % 100 mL IVPB        2 g 200 mL/hr over 30 Minutes Intravenous  Once 06/24/20 1813 06/24/20 1912      Subjective: Without complaints  Objective: Vitals:   06/25/20 1412 06/25/20 2115 06/26/20 0444 06/26/20 1500  BP: 127/83 132/77 (!) 143/89 (!) 142/67  Pulse: 92 88 91 92  Resp:   18 (!) 24  Temp:  98.3 F (36.8 C) 98.7 F (37.1 C) 98.3 F (36.8 C) 98.6 F (37 C)  TempSrc: Axillary Oral  Oral  SpO2:  98% 97% 98%  Weight:      Height:        Intake/Output Summary (Last 24 hours) at 06/26/2020 1520 Last data filed at 06/26/2020 0500 Gross per 24 hour  Intake 3993.75 ml  Output 100 ml  Net 3893.75 ml   Filed Weights   06/24/20 1826 06/25/20 0116  Weight: 95.3 kg 100.3 kg    Examination: General exam: Awake, laying in bed, in nad Respiratory system: Normal respiratory effort, no wheezing Cardiovascular system: regular rate, s1, s2 Gastrointestinal system: Soft, nondistended, positive BS Central nervous system: CN2-12 grossly intact, strength intact Extremities: Perfused, no clubbing Skin: Normal skin turgor, no notable skin lesions seen Psychiatry:  Mood normal // no visual hallucinations   Data Reviewed: I have personally reviewed following labs and imaging studies  CBC: Recent Labs  Lab 06/24/20 1807 06/25/20 0544 06/26/20 0637  WBC 8.5 18.6* 10.9*  NEUTROABS 7.8*  --   --   HGB 14.6 13.4 13.6  HCT 41.5 37.3* 38.3*  MCV 93.9 93.3 93.2  PLT 174 128* 105*   Basic Metabolic Panel: Recent Labs  Lab 06/24/20 1807 06/25/20 0544 06/26/20 0637  NA 138 137 138  K 3.8 3.8 3.2*  CL 105 104 106  CO2 21* 20* 21*  GLUCOSE 96 103* 102*  BUN 22 18 23   CREATININE 1.35* 1.12 1.52*  CALCIUM 9.3 8.7* 8.7*   GFR: Estimated Creatinine Clearance: 43.7 mL/min (A) (by C-G formula based on SCr of 1.52 mg/dL (H)). Liver Function Tests: Recent Labs  Lab 06/24/20 1807 06/25/20 0544 06/26/20 0637  AST 52* 102* 76*  ALT 37 65* 47*  ALKPHOS 117 98 85  BILITOT 1.7* 1.7* 1.7*  PROT 7.1 6.0* 5.8*  ALBUMIN 4.2 3.3* 3.2*   No results for input(s): LIPASE, AMYLASE in the last 168 hours. No results for input(s): AMMONIA in the last 168 hours. Coagulation Profile: Recent Labs  Lab 06/24/20 1807 06/25/20 0544  INR 1.1 1.3*   Cardiac Enzymes: No results for input(s): CKTOTAL, CKMB, CKMBINDEX, TROPONINI in the last 168 hours. BNP (last 3 results) No results for input(s): PROBNP in the last 8760 hours. HbA1C: No results for input(s): HGBA1C in the last 72 hours. CBG: No results for input(s): GLUCAP in the last 168 hours. Lipid Profile: No results for input(s): CHOL, HDL, LDLCALC, TRIG, CHOLHDL, LDLDIRECT in the last 72 hours. Thyroid Function Tests: No results for input(s): TSH, T4TOTAL, FREET4, T3FREE, THYROIDAB in the last 72 hours. Anemia Panel: No results for input(s): VITAMINB12, FOLATE, FERRITIN, TIBC, IRON, RETICCTPCT in the last 72 hours. Sepsis Labs: Recent Labs  Lab 06/24/20 1807 06/24/20 1955 06/25/20 0544  PROCALCITON  --   --  36.23  LATICACIDVEN 3.6* 2.6*  --     Recent Results (from the past 240 hour(s))    Blood Culture (routine x 2)     Status: None (Preliminary result)   Collection Time: 06/24/20  6:07 PM   Specimen: BLOOD  Result Value Ref Range Status   Specimen Description   Final    BLOOD LEFT ANTECUBITAL Performed at [facility], [address], [place], [place]    Special Requests   Final    BOTTLES DRAWN AEROBIC AND ANAEROBIC Blood Culture adequate volume Performed at [facility], [address], [place], [place]  Culture  Setup Time   Final    GRAM NEGATIVE RODS IN BOTH AEROBIC AND ANAEROBIC BOTTLES CRITICAL VALUE NOTED.  VALUE IS CONSISTENT WITH PREVIOUSLY REPORTED AND CALLED VALUE. Performed at [facility], [address], [place], [place] [address]    Culture GRAM NEGATIVE RODS  Final   Report Status PENDING  Incomplete  Blood Culture (routine x 2)     Status: None (Preliminary result)   Collection Time: 06/24/20  6:12 PM   Specimen: BLOOD  Result Value Ref Range Status   Specimen Description   Final    BLOOD RIGHT ANTECUBITAL Performed at [facility], [address] [place], [place] [address]    Special Requests   Final    BOTTLES DRAWN AEROBIC AND ANAEROBIC Blood Culture adequate volume Performed at [facility], [address], [place], [place] [address]    Culture  Setup Time   Final    IN BOTH AEROBIC AND ANAEROBIC BOTTLES GRAM NEGATIVE RODS CRITICAL RESULT CALLED TO, READ BACK BY AND VERIFIED WITH: Eduardo Osier MICHAELS [id number] FCP    Culture   Final    GRAM NEGATIVE RODS IDENTIFICATION AND SUSCEPTIBILITIES TO FOLLOW Performed at [facility], [address], [place], [place] [address]    Report Status PENDING  Incomplete  Blood Culture ID Panel (Reflexed)     Status: Abnormal   Collection Time: 06/24/20  6:12 PM  Result Value Ref Range Status   Enterococcus faecalis NOT DETECTED NOT DETECTED Final   Enterococcus Faecium NOT DETECTED  NOT DETECTED Final   Listeria monocytogenes NOT DETECTED NOT DETECTED Final   Staphylococcus species NOT DETECTED NOT DETECTED Final   Staphylococcus aureus (BCID) NOT DETECTED NOT DETECTED Final   Staphylococcus epidermidis NOT DETECTED NOT DETECTED Final   Staphylococcus lugdunensis NOT DETECTED NOT DETECTED Final   Streptococcus species NOT DETECTED NOT DETECTED Final   Streptococcus agalactiae NOT DETECTED NOT DETECTED Final   Streptococcus pneumoniae NOT DETECTED NOT DETECTED Final   Streptococcus pyogenes NOT DETECTED NOT DETECTED Final   A.calcoaceticus-baumannii NOT DETECTED NOT DETECTED Final   Bacteroides fragilis NOT DETECTED NOT DETECTED Final   Enterobacterales DETECTED (A) NOT DETECTED Final    Comment: Enterobacterales represent a large order of gram negative bacteria, not a single organism. Refer to culture for further identification. CRITICAL RESULT CALLED TO, READ BACK BY AND VERIFIED WITH: PHARMD M MICHAELS [id number] FCP    Enterobacter cloacae complex NOT DETECTED NOT DETECTED Final   Escherichia coli NOT DETECTED NOT DETECTED Final   Klebsiella aerogenes NOT DETECTED NOT DETECTED Final   Klebsiella oxytoca NOT DETECTED NOT DETECTED Final   Klebsiella pneumoniae NOT DETECTED NOT DETECTED Final   Proteus species NOT DETECTED NOT DETECTED Final   Salmonella species NOT DETECTED NOT DETECTED Final   Serratia marcescens NOT DETECTED NOT DETECTED Final   Haemophilus influenzae NOT DETECTED NOT DETECTED Final   Neisseria meningitidis NOT DETECTED NOT DETECTED Final   Pseudomonas aeruginosa NOT DETECTED NOT DETECTED Final   Stenotrophomonas maltophilia NOT DETECTED NOT DETECTED Final   Candida albicans NOT DETECTED NOT DETECTED Final   Candida auris NOT DETECTED NOT DETECTED Final   Candida glabrata NOT DETECTED NOT DETECTED Final   Candida krusei NOT DETECTED NOT DETECTED Final   Candida parapsilosis NOT DETECTED NOT DETECTED Final   Candida tropicalis NOT DETECTED  NOT DETECTED Final   Cryptococcus neoformans/gattii NOT DETECTED NOT DETECTED Final   CTX-M ESBL NOT DETECTED  NOT DETECTED Final   Carbapenem resistance IMP NOT DETECTED NOT DETECTED Final   Carbapenem resistance KPC NOT DETECTED NOT DETECTED Final   Carbapenem resistance NDM NOT DETECTED NOT DETECTED Final   Carbapenem resist OXA 48 LIKE NOT DETECTED NOT DETECTED Final   Carbapenem resistance VIM NOT DETECTED NOT DETECTED Final    Comment: Performed at [facility], [address] [place], [place] [address]  Respiratory Panel by RT PCR (Flu A&B, Covid) - Nasopharyngeal Swab     Status: None   Collection Time: 06/24/20  6:26 PM   Specimen: Nasopharyngeal Swab  Result Value Ref Range Status   SARS Coronavirus 2 by RT PCR NEGATIVE NEGATIVE Final    Comment: (NOTE) SARS-CoV-2 target nucleic acids are NOT DETECTED.  The SARS-CoV-2 RNA is generally detectable in upper respiratoy specimens during the acute phase of infection. The lowest concentration of SARS-CoV-2 viral copies this assay can detect is 131 copies/mL. A negative result does not preclude SARS-Cov-2 infection and should not be used as the sole basis for treatment or other patient management decisions. A negative result may occur with  improper specimen collection/handling, submission of specimen other than nasopharyngeal swab, presence of viral mutation(s) within the areas targeted by this assay, and inadequate number of viral copies (<131 copies/mL). A negative result must be combined with clinical observations, patient history, and epidemiological information. The expected result is Negative.  Fact Sheet for Patients:  https://www.moore.com/  Fact Sheet for Healthcare Providers:  https://www.young.biz/  This test is no t yet approved or cleared by the [place] FDA and  has been authorized for detection and/or diagnosis of SARS-CoV-2 by FDA under an Emergency Use  Authorization (EUA). This EUA will remain  in effect (meaning this test can be used) for the duration of the COVID-19 declaration under Section 564(b)(1) of the Act, 21 U.S.C. section 360bbb-3(b)(1), unless the authorization is terminated or revoked sooner.     Influenza A by PCR NEGATIVE NEGATIVE Final   Influenza B by PCR NEGATIVE NEGATIVE Final    Comment: (NOTE) The Xpert Xpress SARS-CoV-2/FLU/RSV assay is intended as an aid in  the diagnosis of influenza from Nasopharyngeal swab specimens and  should not be used as a sole basis for treatment. Nasal washings and  aspirates are unacceptable for Xpert Xpress SARS-CoV-2/FLU/RSV  testing.  Fact Sheet for Patients: https://www.moore.com/  Fact Sheet for Healthcare Providers: https://www.young.biz/  This test is not yet approved or cleared by the [place] FDA and  has been authorized for detection and/or diagnosis of SARS-CoV-2 by  FDA under an Emergency Use Authorization (EUA). This EUA will remain  in effect (meaning this test can be used) for the duration of the  Covid-19 declaration under Section 564(b)(1) of the Act, 21  U.S.C. section 360bbb-3(b)(1), unless the authorization is  terminated or revoked. Performed at [facility], [address] [place], [place] [address]   Urine culture     Status: Abnormal   Collection Time: 06/24/20  6:28 PM   Specimen: In/Out Cath Urine  Result Value Ref Range Status   Specimen Description   Final    IN/OUT CATH URINE Performed at [facility], [address] [place], [place] [address]    Special Requests   Final    NONE Performed at [facility], [address], [place], [place] [address]    Culture 70,000 COLONIES/mL CITROBACTER FREUNDII (A)  Final   Report Status 06/26/2020 FINAL  Final  Organism ID, Bacteria CITROBACTER FREUNDII (A)  Final      Susceptibility   Citrobacter  freundii - MIC*    CEFAZOLIN >=64 RESISTANT Resistant     CEFEPIME <=0.12 SENSITIVE Sensitive     CEFTRIAXONE <=0.25 SENSITIVE Sensitive     CIPROFLOXACIN <=0.25 SENSITIVE Sensitive     GENTAMICIN >=16 RESISTANT Resistant     IMIPENEM 1 SENSITIVE Sensitive     NITROFURANTOIN <=16 SENSITIVE Sensitive     TRIMETH/SULFA <=20 SENSITIVE Sensitive     PIP/TAZO <=4 SENSITIVE Sensitive     * 70,000 COLONIES/mL CITROBACTER FREUNDII     Radiology Studies: DG Chest Port 1 View  Result Date: 06/24/2020 CLINICAL DATA:  79 year old male with possible sepsis. EXAM: PORTABLE CHEST 1 VIEW COMPARISON:  CT Abdomen and Pelvis 06/16/2020. FINDINGS: Portable AP semi upright view at 2022 hours. Low lung volumes. Mediastinal contours remain within normal limits. Visualized tracheal air column is within normal limits. No pneumothorax. No pleural effusion is evident. Partially visible right upper lobe airspace opacity on the recent CT is subtle radiographically. No definite confluent opacity elsewhere. Paucity of bowel gas in the upper abdomen. Amorphous calcific density projecting over the proximal right humerus may be external artifact. No acute osseous abnormality identified. IMPRESSION: 1. Subtle opacity in the lateral right upper lobe where evidence of pneumonia was partially visible on the recent CT. 2. Low lung volumes.  No other acute cardiopulmonary abnormality. Electronically Signed   By: Odessa FlemingH  Hall M.D.   On: 06/24/2020 20:30    Scheduled Meds: . Chlorhexidine Gluconate Cloth  6 each Topical Daily  . enoxaparin (LOVENOX) injection  40 mg Subcutaneous Q24H  . haloperidol lactate  2 mg Intravenous Once   Continuous Infusions: . ceFEPime (MAXIPIME) IV 2 g (06/26/20 1040)  . lactated ringers 125 mL/hr at 06/25/20 0120     LOS: 2 days   Rickey BarbaraStephen Ramsay Bognar, MD [facility] Pager On Amion  If 7PM-7AM, please contact night-coverage 06/26/2020, 3:20 PM

## 2020-06-26 NOTE — TOC Initial Note (Signed)
Transition of Care [facility]) - Initial/Assessment Note    Patient Details  Name: William Bishop MRN: [id number] Date of Birth: [birth year]  Transition of Care [facility]) CM/SW Contact:    Lanier Clam, RN Phone Number: 06/26/2020, 3:44 PM  Clinical Narrative: Spoke to spouse about d/c plans-patient already has f/c-she manages it;has all dme.,has pcp,pharmacy,transport. She does not want him in a nursing home, or memory care place-when that time comes she will plan it with her pcp.PT-recc supv.                  Expected Discharge Plan: Home/Self Care Barriers to Discharge: Continued Medical Work up   Patient Goals and CMS Choice Patient states their goals for this hospitalization and ongoing recovery are:: go home CMS Medicare.gov Compare Post Acute Care list provided to:: Patient Represenative (must comment) Choice offered to / list presented to : Spouse  Expected Discharge Plan and Services Expected Discharge Plan: Home/Self Care       Living arrangements for the past 2 months: Single Family Home                                      Prior Living Arrangements/Services Living arrangements for the past 2 months: Single Family Home Lives with:: Spouse Patient language and need for interpreter reviewed:: Yes Do you feel safe going back to the place where you live?: Yes      Need for Family Participation in Patient Care: No (Comment) Care giver support system in place?: Yes (comment) Current home services: DME (rw,3n1;f/c care spouse already manages.) Criminal Activity/Legal Involvement Pertinent to Current Situation/Hospitalization: No - Comment as needed  Activities of Daily Living Home Assistive Devices/Equipment: None ADL Screening (condition at time of admission) Patient's cognitive ability adequate to safely complete daily activities?: Yes Is the patient deaf or have difficulty hearing?: No Does the patient have difficulty seeing, even when wearing glasses/contacts?:  No Does the patient have difficulty concentrating, remembering, or making decisions?: Yes Patient able to express need for assistance with ADLs?: Yes Does the patient have difficulty dressing or bathing?: Yes Independently performs ADLs?: No Communication: Independent Dressing (OT): Dependent Is this a change from baseline?: Pre-admission baseline Grooming: Dependent Is this a change from baseline?: Pre-admission baseline Feeding: Independent Bathing: Dependent Is this a change from baseline?: Pre-admission baseline Toileting: Independent In/Out Bed: Needs assistance Is this a change from baseline?: Pre-admission baseline Walks in Home: Independent Does the patient have difficulty walking or climbing stairs?: Yes Weakness of Legs: Both Weakness of Arms/Hands: None  Permission Sought/Granted Permission sought to share information with : Case Manager Permission granted to share information with : Yes, Verbal Permission Granted  Share Information with NAME: Case Manager     Permission granted to share info w Relationship: Alice spuse [phone]     Emotional Assessment Appearance:: Appears stated age Attitude/Demeanor/Rapport: Gracious Affect (typically observed): Accepting Orientation: : Oriented to Self, Oriented to Place, Oriented to  Time, Oriented to Situation Alcohol / Substance Use: Not Applicable Psych Involvement: No (comment)  Admission diagnosis:  Sepsis secondary to UTI (HCC) [A41.9, N39.0] Sepsis without acute organ dysfunction, due to unspecified organism [facility]) [A41.9] Patient Active Problem List   Diagnosis Date Noted  . Sepsis secondary to UTI (HCC) 06/24/2020  . Acute lower UTI 06/24/2020  . Cholecystitis with cholelithiasis 11/03/2018   PCP:  [name], MD Pharmacy:   [facility]  [id number] [place], [place] - [address] [facility] [place] [address] Phone: [phone] Fax: [phone]     Social Determinants of Health  (SDOH) Interventions    Readmission Risk Interventions No flowsheet data found.

## 2020-06-27 ENCOUNTER — Inpatient Hospital Stay (HOSPITAL_COMMUNITY): Payer: Medicare Other

## 2020-06-27 DIAGNOSIS — A419 Sepsis, unspecified organism: Secondary | ICD-10-CM | POA: Diagnosis not present

## 2020-06-27 DIAGNOSIS — N390 Urinary tract infection, site not specified: Secondary | ICD-10-CM | POA: Diagnosis not present

## 2020-06-27 LAB — COMPREHENSIVE METABOLIC PANEL
ALT: 33 U/L (ref 0–44)
AST: 43 U/L — ABNORMAL HIGH (ref 15–41)
Albumin: 2.8 g/dL — ABNORMAL LOW (ref 3.5–5.0)
Alkaline Phosphatase: 69 U/L (ref 38–126)
Anion gap: 9 (ref 5–15)
BUN: 24 mg/dL — ABNORMAL HIGH (ref 8–23)
CO2: 23 mmol/L (ref 22–32)
Calcium: 8.3 mg/dL — ABNORMAL LOW (ref 8.9–10.3)
Chloride: 108 mmol/L (ref 98–111)
Creatinine, Ser: 1.65 mg/dL — ABNORMAL HIGH (ref 0.61–1.24)
GFR, Estimated: 42 mL/min — ABNORMAL LOW (ref >60)
Glucose, Bld: 86 mg/dL (ref 70–99)
Potassium: 3.2 mmol/L — ABNORMAL LOW (ref 3.5–5.1)
Sodium: 140 mmol/L (ref 135–145)
Total Bilirubin: 1 mg/dL (ref 0.3–1.2)
Total Protein: 5.2 g/dL — ABNORMAL LOW (ref 6.5–8.1)

## 2020-06-27 LAB — CBC
HCT: 36.2 % — ABNORMAL LOW (ref 39.0–52.0)
Hemoglobin: 12.9 g/dL — ABNORMAL LOW (ref 13.0–17.0)
MCH: 33.2 pg (ref 26.0–34.0)
MCHC: 35.6 g/dL (ref 30.0–36.0)
MCV: 93.1 fL (ref 80.0–100.0)
Platelets: 104 10*3/uL — ABNORMAL LOW (ref 150–400)
RBC: 3.89 MIL/uL — ABNORMAL LOW (ref 4.22–5.81)
RDW: 13.8 % (ref 11.5–15.5)
WBC: 8.2 10*3/uL (ref 4.0–10.5)
nRBC: 0 % (ref 0.0–0.2)

## 2020-06-27 LAB — CULTURE, BLOOD (ROUTINE X 2)
Special Requests: ADEQUATE
Special Requests: ADEQUATE

## 2020-06-27 LAB — MAGNESIUM: Magnesium: 2 mg/dL (ref 1.7–2.4)

## 2020-06-27 LAB — GLUCOSE, CAPILLARY: Glucose-Capillary: 90 mg/dL (ref 70–99)

## 2020-06-27 MED ORDER — SODIUM CHLORIDE 0.9 % IV SOLN: INTRAVENOUS | Status: DC

## 2020-06-27 MED ORDER — POTASSIUM CHLORIDE CRYS ER 20 MEQ PO TBCR
40.0000 meq | EXTENDED_RELEASE_TABLET | Freq: Once | ORAL | Status: AC
  Administered 2020-06-27: 40 meq via ORAL
  Filled 2020-06-27: qty 2

## 2020-06-27 MED ORDER — SODIUM CHLORIDE 0.9 % IV SOLN
2.0000 g | Freq: Two times a day (BID) | INTRAVENOUS | Status: DC
  Administered 2020-06-28 – 2020-07-03 (×11): 2 g via INTRAVENOUS
  Filled 2020-06-27 (×11): qty 2

## 2020-06-27 NOTE — Progress Notes (Signed)
PROGRESS NOTE    William Bishop  UJW:[id number] DOB: 08-Mar-1941 DOA: 06/24/2020 PCP: [name], MD    Brief Narrative:   79 y.o. male with medical history significant of Dementia, Urinary retention, recent constipation who has recent visit to the ER on November 8 where he had acute urinary retention. Patient had a Foley catheter inserted and then removed days later. He came back with increased confusion and fevers. In the ED, pt was found to be septic with UTI.   Assessment & Plan:   Principal Problem:   Sepsis secondary to UTI [facility]) Active Problems:   Acute lower UTI   #1 Severe sepsis secondary to Citrobacter freundii UTI and bacteremia:  -Presented with fever, leukocytosis, acute encephalopathy, ARF with lactic acidosis -UA suggests UTI -Cultures thus far pos for Citrobacter freundii sensitive to cefepime, rocephin, ciprofloxacin, imipenem, zosyn, bactrim -Continued on cefepime -Lactate improved and WBC continues to improve -cont abx for now  #2 UTI secondary to foley cath -Recently required foley cath, since removed prior to admit -Overnight and this AM, pt is continuing to retain urine, requiring I/o cath -If pt continues to require I/O cath >24hrs, then would place indwelling cath and have pt f/u with Urology -cont abx per above  #3 Toxic metabolic encephalopathy with underlying dementia:  -Noted to be confused at time of presentation -Mentation now much improved -Cont abx per above  #4 recent constipation: -Resolved per family  #5 ARF -Likely secondary to presenting severe sepsis -Cr peaked to 1.52, up to 1.65 today -Continued on IVF and cont foley cath per above -Recheck bmet in AM . DVT prophylaxis: Lovenox subq Code Status: Full Family Communication: Pt in room, pt's wife at bedside  Status is: Inpatient  Remains inpatient appropriate because:Unsafe d/c plan and IV treatments appropriate due to intensity of illness or inability to take  PO  Dispo: The patient is from: Home              Anticipated d/c is to: Home              Anticipated d/c date is: 2 days              Patient currently is not medically stable to d/c.   Consultants:     Procedures:     Antimicrobials: Anti-infectives (From admission, onward)   Start     Dose/Rate Route Frequency Ordered Stop   06/27/20 2359  ceFEPIme (MAXIPIME) 2 g in sodium chloride 0.9 % 100 mL IVPB        2 g 200 mL/hr over 30 Minutes Intravenous Every 12 hours 06/27/20 1328     06/25/20 1700  ceFEPIme (MAXIPIME) 2 g in sodium chloride 0.9 % 100 mL IVPB  Status:  Discontinued        2 g 200 mL/hr over 30 Minutes Intravenous Every 8 hours 06/25/20 1522 06/27/20 1328   06/25/20 0930  ceFEPIme (MAXIPIME) 2 g in sodium chloride 0.9 % 100 mL IVPB  Status:  Discontinued        2 g 200 mL/hr over 30 Minutes Intravenous Every 8 hours 06/25/20 0854 06/25/20 1522   06/25/20 0114  cefTRIAXone (ROCEPHIN) 1 g in sodium chloride 0.9 % 100 mL IVPB  Status:  Discontinued        1 g 200 mL/hr over 30 Minutes Intravenous Every 24 hours 06/25/20 0114 06/25/20 0854   06/24/20 1815  ceFEPIme (MAXIPIME) 2 g in sodium chloride 0.9 % 100 mL IVPB  2 g 200 mL/hr over 30 Minutes Intravenous  Once 06/24/20 1813 06/24/20 1912      Subjective: Pleasantly confused without complaints  Objective: Vitals:   06/26/20 0444 06/26/20 1500 06/26/20 2147 06/27/20 0659  BP: (!) 143/89 (!) 142/67 (!) 130/92 127/86  Pulse: 91 92 86 74  Resp: 18 (!) 24 20   Temp: 98.3 F (36.8 C) 98.6 F (37 C) 99.1 F (37.3 C) 98.4 F (36.9 C)  TempSrc:  Oral Oral Oral  SpO2: 97% 98% 97% 95%  Weight:      Height:        Intake/Output Summary (Last 24 hours) at 06/27/2020 1649 Last data filed at 06/27/2020 1100 Gross per 24 hour  Intake 240 ml  Output 1600 ml  Net -1360 ml   Filed Weights   06/24/20 1826 06/25/20 0116  Weight: 95.3 kg 100.3 kg    Examination: General exam: Conversant, in no acute  distress Respiratory system: normal chest rise, clear, no audible wheezing Cardiovascular system: regular rhythm, s1-s2 Gastrointestinal system: Nondistended, nontender, pos BS Central nervous system: No seizures, no tremors Extremities: No cyanosis, no joint deformities Skin: No rashes, no pallor Psychiatry: Affect normal // no auditory hallucinations   Data Reviewed: I have personally reviewed following labs and imaging studies  CBC: Recent Labs  Lab 06/24/20 1807 06/25/20 0544 06/26/20 0637 06/27/20 0608  WBC 8.5 18.6* 10.9* 8.2  NEUTROABS 7.8*  --   --   --   HGB 14.6 13.4 13.6 12.9*  HCT 41.5 37.3* 38.3* 36.2*  MCV 93.9 93.3 93.2 93.1  PLT 174 128* 105* 104*   Basic Metabolic Panel: Recent Labs  Lab 06/24/20 1807 06/25/20 0544 06/26/20 0637 06/27/20 0608  NA 138 137 138 140  K 3.8 3.8 3.2* 3.2*  CL 105 104 106 108  CO2 21* 20* 21* 23  GLUCOSE 96 103* 102* 86  BUN 22 18 23  24*  CREATININE 1.35* 1.12 1.52* 1.65*  CALCIUM 9.3 8.7* 8.7* 8.3*  MG  --   --   --  2.0   GFR: Estimated Creatinine Clearance: 40.3 mL/min (A) (by C-G formula based on SCr of 1.65 mg/dL (H)). Liver Function Tests: Recent Labs  Lab 06/24/20 1807 06/25/20 0544 06/26/20 0637 06/27/20 0608  AST 52* 102* 76* 43*  ALT 37 65* 47* 33  ALKPHOS 117 98 85 69  BILITOT 1.7* 1.7* 1.7* 1.0  PROT 7.1 6.0* 5.8* 5.2*  ALBUMIN 4.2 3.3* 3.2* 2.8*   No results for input(s): LIPASE, AMYLASE in the last 168 hours. No results for input(s): AMMONIA in the last 168 hours. Coagulation Profile: Recent Labs  Lab 06/24/20 1807 06/25/20 0544  INR 1.1 1.3*   Cardiac Enzymes: No results for input(s): CKTOTAL, CKMB, CKMBINDEX, TROPONINI in the last 168 hours. BNP (last 3 results) No results for input(s): PROBNP in the last 8760 hours. HbA1C: No results for input(s): HGBA1C in the last 72 hours. CBG: No results for input(s): GLUCAP in the last 168 hours. Lipid Profile: No results for input(s): CHOL,  HDL, LDLCALC, TRIG, CHOLHDL, LDLDIRECT in the last 72 hours. Thyroid Function Tests: No results for input(s): TSH, T4TOTAL, FREET4, T3FREE, THYROIDAB in the last 72 hours. Anemia Panel: No results for input(s): VITAMINB12, FOLATE, FERRITIN, TIBC, IRON, RETICCTPCT in the last 72 hours. Sepsis Labs: Recent Labs  Lab 06/24/20 1807 06/24/20 1955 06/25/20 0544  PROCALCITON  --   --  36.23  LATICACIDVEN 3.6* 2.6*  --     Recent Results (from the  past 240 hour(s))  Blood Culture (routine x 2)     Status: Abnormal   Collection Time: 06/24/20  6:07 PM   Specimen: BLOOD  Result Value Ref Range Status   Specimen Description   Final    BLOOD LEFT ANTECUBITAL Performed at [facility], [address] [place], [place] [address]    Special Requests   Final    BOTTLES DRAWN AEROBIC AND ANAEROBIC Blood Culture adequate volume Performed at [facility], [address] [place], [place] [address]    Culture  Setup Time   Final    GRAM NEGATIVE RODS IN BOTH AEROBIC AND ANAEROBIC BOTTLES CRITICAL VALUE NOTED.  VALUE IS CONSISTENT WITH PREVIOUSLY REPORTED AND CALLED VALUE.    Culture (A)  Final    CITROBACTER FREUNDII SUSCEPTIBILITIES PERFORMED ON PREVIOUS CULTURE WITHIN THE LAST 5 DAYS. Performed at [facility], [address] [place], [place] [address]    Report Status 06/27/2020 FINAL  Final  Blood Culture (routine x 2)     Status: Abnormal   Collection Time: 06/24/20  6:12 PM   Specimen: BLOOD  Result Value Ref Range Status   Specimen Description   Final    BLOOD RIGHT ANTECUBITAL Performed at [facility], [address] [place], [place] [address]    Special Requests   Final    BOTTLES DRAWN AEROBIC AND ANAEROBIC Blood Culture adequate volume Performed at [facility], [address] [place], [place] [address]    Culture  Setup Time   Final    IN BOTH AEROBIC AND ANAEROBIC BOTTLES GRAM NEGATIVE  RODS CRITICAL RESULT CALLED TO, READ BACK BY AND VERIFIED WITH: Kathrynn SpeedHARMD M MICHAELS [id number] FCP Performed at [facility], [address] [place], [place] [address]    Culture CITROBACTER FREUNDII (A)  Final   Report Status 06/27/2020 FINAL  Final   Organism ID, Bacteria CITROBACTER FREUNDII  Final      Susceptibility   Citrobacter freundii - MIC*    CEFAZOLIN >=64 RESISTANT Resistant     CEFEPIME <=0.12 SENSITIVE Sensitive     CEFTAZIDIME <=1 SENSITIVE Sensitive     CEFTRIAXONE 8 RESISTANT Resistant     CIPROFLOXACIN <=0.25 SENSITIVE Sensitive     GENTAMICIN >=16 RESISTANT Resistant     IMIPENEM 1 SENSITIVE Sensitive     TRIMETH/SULFA >=320 RESISTANT Resistant     PIP/TAZO <=4 SENSITIVE Sensitive     * CITROBACTER FREUNDII  Blood Culture ID Panel (Reflexed)     Status: Abnormal   Collection Time: 06/24/20  6:12 PM  Result Value Ref Range Status   Enterococcus faecalis NOT DETECTED NOT DETECTED Final   Enterococcus Faecium NOT DETECTED NOT DETECTED Final   Listeria monocytogenes NOT DETECTED NOT DETECTED Final   Staphylococcus species NOT DETECTED NOT DETECTED Final   Staphylococcus aureus (BCID) NOT DETECTED NOT DETECTED Final   Staphylococcus epidermidis NOT DETECTED NOT DETECTED Final   Staphylococcus lugdunensis NOT DETECTED NOT DETECTED Final   Streptococcus species NOT DETECTED NOT DETECTED Final   Streptococcus agalactiae NOT DETECTED NOT DETECTED Final   Streptococcus pneumoniae NOT DETECTED NOT DETECTED Final   Streptococcus pyogenes NOT DETECTED NOT DETECTED Final   A.calcoaceticus-baumannii NOT DETECTED NOT DETECTED Final   Bacteroides fragilis NOT DETECTED NOT DETECTED Final   Enterobacterales DETECTED (A) NOT DETECTED Final    Comment: Enterobacterales represent a large order of gram negative bacteria, not a single organism. Refer to culture for  further identification. CRITICAL RESULT CALLED TO, READ BACK BY AND VERIFIED WITH: PHARMD M MICHAELS [id number]  [id number] FCP    Enterobacter cloacae complex NOT DETECTED NOT DETECTED Final   Escherichia coli NOT DETECTED NOT DETECTED Final   Klebsiella aerogenes NOT DETECTED NOT DETECTED Final   Klebsiella oxytoca NOT DETECTED NOT DETECTED Final   Klebsiella pneumoniae NOT DETECTED NOT DETECTED Final   Proteus species NOT DETECTED NOT DETECTED Final   Salmonella species NOT DETECTED NOT DETECTED Final   Serratia marcescens NOT DETECTED NOT DETECTED Final   Haemophilus influenzae NOT DETECTED NOT DETECTED Final   Neisseria meningitidis NOT DETECTED NOT DETECTED Final   Pseudomonas aeruginosa NOT DETECTED NOT DETECTED Final   Stenotrophomonas maltophilia NOT DETECTED NOT DETECTED Final   Candida albicans NOT DETECTED NOT DETECTED Final   Candida auris NOT DETECTED NOT DETECTED Final   Candida glabrata NOT DETECTED NOT DETECTED Final   Candida krusei NOT DETECTED NOT DETECTED Final   Candida parapsilosis NOT DETECTED NOT DETECTED Final   Candida tropicalis NOT DETECTED NOT DETECTED Final   Cryptococcus neoformans/gattii NOT DETECTED NOT DETECTED Final   CTX-M ESBL NOT DETECTED NOT DETECTED Final   Carbapenem resistance IMP NOT DETECTED NOT DETECTED Final   Carbapenem resistance KPC NOT DETECTED NOT DETECTED Final   Carbapenem resistance NDM NOT DETECTED NOT DETECTED Final   Carbapenem resist OXA 48 LIKE NOT DETECTED NOT DETECTED Final   Carbapenem resistance VIM NOT DETECTED NOT DETECTED Final    Comment: Performed at [facility], [address] [place], [place] [address]  Respiratory Panel by RT PCR (Flu A&B, Covid) - Nasopharyngeal Swab     Status: None   Collection Time: 06/24/20  6:26 PM   Specimen: Nasopharyngeal Swab  Result Value Ref Range Status   SARS Coronavirus 2 by RT PCR NEGATIVE NEGATIVE Final    Comment: (NOTE) SARS-CoV-2 target nucleic acids are NOT DETECTED.  The SARS-CoV-2 RNA is generally detectable in upper respiratoy specimens during the acute phase of infection.  The lowest concentration of SARS-CoV-2 viral copies this assay can detect is 131 copies/mL. A negative result does not preclude SARS-Cov-2 infection and should not be used as the sole basis for treatment or other patient management decisions. A negative result may occur with  improper specimen collection/handling, submission of specimen other than nasopharyngeal swab, presence of viral mutation(s) within the areas targeted by this assay, and inadequate number of viral copies (<131 copies/mL). A negative result must be combined with clinical observations, patient history, and epidemiological information. The expected result is Negative.  Fact Sheet for Patients:  https://www.moore.com/  Fact Sheet for Healthcare Providers:  https://www.young.biz/  This test is no t yet approved or cleared by the [place] FDA and  has been authorized for detection and/or diagnosis of SARS-CoV-2 by FDA under an Emergency Use Authorization (EUA). This EUA will remain  in effect (meaning this test can be used) for the duration of the COVID-19 declaration under Section 564(b)(1) of the Act, 21 U.S.C. section 360bbb-3(b)(1), unless the authorization is terminated or revoked sooner.     Influenza A by PCR NEGATIVE NEGATIVE Final   Influenza B by PCR NEGATIVE NEGATIVE Final    Comment: (NOTE) The Xpert Xpress SARS-CoV-2/FLU/RSV assay is intended as an aid in  the diagnosis of influenza from Nasopharyngeal swab specimens and  should not be used as a sole basis for treatment. Nasal washings and  aspirates are unacceptable for Xpert Xpress SARS-CoV-2/FLU/RSV  testing.  Fact  Sheet for Patients: https://www.moore.com/  Fact Sheet for Healthcare Providers: https://www.young.biz/  This test is not yet approved or cleared by the [place] FDA and  has been authorized for detection and/or diagnosis of SARS-CoV-2 by  FDA  under an Emergency Use Authorization (EUA). This EUA will remain  in effect (meaning this test can be used) for the duration of the  Covid-19 declaration under Section 564(b)(1) of the Act, 21  U.S.C. section 360bbb-3(b)(1), unless the authorization is  terminated or revoked. Performed at [facility], [address], [place], [place] [address]   Urine culture     Status: Abnormal   Collection Time: 06/24/20  6:28 PM   Specimen: In/Out Cath Urine  Result Value Ref Range Status   Specimen Description   Final    IN/OUT CATH URINE Performed at [facility], [address], [place], [place] [address]    Special Requests   Final    NONE Performed at [facility], [address] [place], [place] [address]    Culture 70,000 COLONIES/mL CITROBACTER FREUNDII (A)  Final   Report Status 06/26/2020 FINAL  Final   Organism ID, Bacteria CITROBACTER FREUNDII (A)  Final      Susceptibility   Citrobacter freundii - MIC*    CEFAZOLIN >=64 RESISTANT Resistant     CEFEPIME <=0.12 SENSITIVE Sensitive     CEFTRIAXONE <=0.25 SENSITIVE Sensitive     CIPROFLOXACIN <=0.25 SENSITIVE Sensitive     GENTAMICIN >=16 RESISTANT Resistant     IMIPENEM 1 SENSITIVE Sensitive     NITROFURANTOIN <=16 SENSITIVE Sensitive     TRIMETH/SULFA <=20 SENSITIVE Sensitive     PIP/TAZO <=4 SENSITIVE Sensitive     * 70,000 COLONIES/mL CITROBACTER FREUNDII     Radiology Studies: US RENAL  Result Date: 06/27/2020 CLINICAL DATA:  79 year old male with acute renal failure. EXAM: RENAL / URINARY TRACT ULTRASOUND COMPLETE COMPARISON:  CT Abdomen and Pelvis 06/16/2020, 11/02/2018 FINDINGS: Right Kidney: Renal measurements: 15.2 x 6.7 x 6.8 cm = volume: 365 mL. No right hydronephrosis. Multiple chronic benign appearing renal cysts (fewer than 10 cysts) measuring up to 3.9 cm diameter (image 9). Chronic right lower pole nephrolithiasis redemonstrated (image 18). Cortical  echogenicity within normal limits. Left Kidney: Renal measurements: 13.9 x 6.2 x 5.9 cm = volume: 266 mL. Echogenicity within normal limits. No mass or hydronephrosis visualized. Bladder: Decompressed by a Foley catheter balloon (image 63). Other: None. IMPRESSION: 1. Negative ultrasound appearance of both kidneys aside from right kidney benign renal cysts and lower pole nephrolithiasis. 2. Bladder decompressed by Foley catheter. Electronically Signed   By: Odessa Fleming M.D.   On: 06/27/2020 12:57    Scheduled Meds: . Chlorhexidine Gluconate Cloth  6 each Topical Daily  . enoxaparin (LOVENOX) injection  40 mg Subcutaneous Q24H  . haloperidol lactate  2 mg Intravenous Once   Continuous Infusions: . ceFEPime (MAXIPIME) IV    . lactated ringers 125 mL/hr at 06/27/20 1501     LOS: 3 days   Rickey Barbara, MD [facility] Pager On Amion  If 7PM-7AM, please contact night-coverage 06/27/2020, 4:49 PM

## 2020-06-27 NOTE — Progress Notes (Signed)
Patient resting comfortably in bed. Respirations 18 and wife at bedside.

## 2020-06-27 NOTE — Progress Notes (Signed)
PHARMACY NOTE:  ANTIMICROBIAL RENAL DOSAGE ADJUSTMENT  Current antimicrobial regimen includes a mismatch between antimicrobial dosage and estimated renal function.  As per policy approved by the Pharmacy & Therapeutics and Medical Executive Committees, the antimicrobial dosage will be adjusted accordingly.  Current antimicrobial dosage:  Cefepime 2gm q8  Indication: BCID with unidentifed GNR, Enterobacterales Family  Renal Function:   Estimated Creatinine Clearance: 40.3 mL/min (A) (by C-G formula based on SCr of 1.65 mg/dL (H)). []      On intermittent HD, scheduled: []      On CRRT    Antimicrobial dosage has been changed to:  Cefepime 2gm q12   Additional Comments:    Thank you for allowing pharmacy to be a part of this patient's care.  PharmD 06/27/2020 1:27 PM

## 2020-06-28 DIAGNOSIS — N179 Acute kidney failure, unspecified: Secondary | ICD-10-CM

## 2020-06-28 DIAGNOSIS — A419 Sepsis, unspecified organism: Secondary | ICD-10-CM | POA: Diagnosis not present

## 2020-06-28 DIAGNOSIS — F0390 Unspecified dementia without behavioral disturbance: Secondary | ICD-10-CM

## 2020-06-28 DIAGNOSIS — D696 Thrombocytopenia, unspecified: Secondary | ICD-10-CM

## 2020-06-28 DIAGNOSIS — G9341 Metabolic encephalopathy: Secondary | ICD-10-CM

## 2020-06-28 DIAGNOSIS — T83511A Infection and inflammatory reaction due to indwelling urethral catheter, initial encounter: Principal | ICD-10-CM

## 2020-06-28 DIAGNOSIS — A498 Other bacterial infections of unspecified site: Secondary | ICD-10-CM

## 2020-06-28 LAB — COMPREHENSIVE METABOLIC PANEL
ALT: 28 U/L (ref 0–44)
AST: 33 U/L (ref 15–41)
Albumin: 2.9 g/dL — ABNORMAL LOW (ref 3.5–5.0)
Alkaline Phosphatase: 68 U/L (ref 38–126)
Anion gap: 8 (ref 5–15)
BUN: 22 mg/dL (ref 8–23)
CO2: 24 mmol/L (ref 22–32)
Calcium: 8.5 mg/dL — ABNORMAL LOW (ref 8.9–10.3)
Chloride: 109 mmol/L (ref 98–111)
Creatinine, Ser: 1.83 mg/dL — ABNORMAL HIGH (ref 0.61–1.24)
GFR, Estimated: 37 mL/min — ABNORMAL LOW (ref >60)
Glucose, Bld: 89 mg/dL (ref 70–99)
Potassium: 3.7 mmol/L (ref 3.5–5.1)
Sodium: 141 mmol/L (ref 135–145)
Total Bilirubin: 1.1 mg/dL (ref 0.3–1.2)
Total Protein: 5.7 g/dL — ABNORMAL LOW (ref 6.5–8.1)

## 2020-06-28 LAB — CBC
HCT: 38.2 % — ABNORMAL LOW (ref 39.0–52.0)
Hemoglobin: 13.4 g/dL (ref 13.0–17.0)
MCH: 32.5 pg (ref 26.0–34.0)
MCHC: 35.1 g/dL (ref 30.0–36.0)
MCV: 92.7 fL (ref 80.0–100.0)
Platelets: 119 10*3/uL — ABNORMAL LOW (ref 150–400)
RBC: 4.12 MIL/uL — ABNORMAL LOW (ref 4.22–5.81)
RDW: 13.5 % (ref 11.5–15.5)
WBC: 7.3 10*3/uL (ref 4.0–10.5)
nRBC: 0 % (ref 0.0–0.2)

## 2020-06-28 MED ORDER — HYDROMORPHONE HCL 1 MG/ML IJ SOLN
0.5000 mg | INTRAMUSCULAR | Status: DC | PRN
  Administered 2020-06-28 – 2020-07-08 (×26): 0.5 mg via INTRAVENOUS
  Filled 2020-06-28 (×26): qty 0.5

## 2020-06-28 MED ORDER — TAMSULOSIN HCL 0.4 MG PO CAPS
0.4000 mg | ORAL_CAPSULE | Freq: Every day | ORAL | Status: DC
  Administered 2020-06-29 – 2020-07-06 (×5): 0.4 mg via ORAL
  Filled 2020-06-28 (×6): qty 1

## 2020-06-28 NOTE — Progress Notes (Signed)
PROGRESS NOTE  William Bishop ZOX:[id number] DOB: 1941-07-03   PCP: [name], MD  Patient is from: Home  DOA: 06/24/2020 LOS: 4  Chief complaints: Confusion and fever  Brief Narrative / Interim history: 79 year old male with history of severe dementia, urinary retention s/p indwelling Foley that was recently removed presenting with increased confusion and fever, and admitted for severe sepsis due to Citrobacter freundii UTI and bacteremia.  Started on cefepime on 06/24/2020.  Hospital course complicated by AKI  Subjective: Seen and examined earlier this morning.  No major events overnight or this morning.  No complaints but not a great historian.  He is only oriented to self.  Does not even recognize his wife.  No apparent distress.  Objective: Vitals:   06/27/20 0659 06/27/20 1900 06/27/20 2016 06/28/20 0528  BP: 127/86 136/65 136/85 (!) 137/94  Pulse: 74  67 87  Resp:   18 19  Temp: 98.4 F (36.9 C) 98 F (36.7 C) 98 F (36.7 C) 98.7 F (37.1 C)  TempSrc: Oral Oral  Oral  SpO2: 95% 95% 98% 97%  Weight:      Height:        Intake/Output Summary (Last 24 hours) at 06/28/2020 1349 Last data filed at 06/28/2020 1003 Gross per 24 hour  Intake 1301.94 ml  Output 4900 ml  Net -3598.06 ml   Filed Weights   06/24/20 1826 06/25/20 0116  Weight: 95.3 kg 100.3 kg    Examination:  GENERAL: No apparent distress.  Nontoxic. HEENT: MMM.  Vision and hearing grossly intact.  NECK: Supple.  No apparent JVD.  RESP:  No IWOB.  Fair aeration bilaterally. CVS:  RRR. Heart sounds normal.  ABD/GI/GU: BS+. Abd soft, NTND.  MSK/EXT:  Moves extremities. No apparent deformity. No edema.  SKIN: no apparent skin lesion or wound NEURO: Awake and alert.  Oriented to self only.  No apparent focal neuro deficit. PSYCH: Calm. Normal affect.  Procedures:  None  Microbiology summarized: 11/17-COVID-19, influenza and RSV PCR nonreactive. 11/17-urine culture and blood cultures  with Citrobacter freundii   Assessment & Plan: Severe sepsis due to Citrobacter freundii bacteremia and UTI: POA.  Both urine and blood cultures grew Citrobacter freundii sensitive to cefepime.  Sepsis physiology resolved. -Continue IV cefepime 11/17 through 11/23. No great p.o. option other than Cipro.   Catheter associated Citrobacter freundii UTI-Foley removed prior to admit. -Antibiotics as above.  Urinary retention: Seems to be voiding okay.  About 5.1 L UOP/24 hours. -Monitor urinary output  Toxic metabolic encephalopathy likely due to severe sepsis and underlying dementia. -Treat sepsis as above -Reorientation and delirium precautions  Constipation: Resolved. -Bowel regimen as needed  AKI: Baseline Cr 1.2-1.3> 1.35 (admit)>> 1.12>> 1.84.  BUN 22.  Did not receive nephrotoxic meds.  Due to severe sepsis?  Renal ultrasound reassuring.  Excellent urine output, about 5.1 L / 24 hours. -Continue IV NS -Check urine chemistry -Recheck bmet in AM  Hypokalemia: Resolved.  Thrombocytopenia: Platelet 119.  Improving.  Body mass index is 35.69 kg/m.         DVT prophylaxis:  enoxaparin (LOVENOX) injection 40 mg Start: 06/25/20 0600  Code Status: DNR/DNI Family Communication: Updated patient's wife at bedside. Status is: Inpatient  Remains inpatient appropriate because:IV treatments appropriate due to intensity of illness or inability to take PO   Dispo: The patient is from: Home              Anticipated d/c is to: Home  Anticipated d/c date is: 3 days              Patient currently is not medically stable to d/c.       Consultants:  Infectious disease over the phone   Sch Meds:  Scheduled Meds: . Chlorhexidine Gluconate Cloth  6 each Topical Daily  . enoxaparin (LOVENOX) injection  40 mg Subcutaneous Q24H  . haloperidol lactate  2 mg Intravenous Once   Continuous Infusions: . sodium chloride 75 mL/hr at 06/28/20 0948  . ceFEPime (MAXIPIME)  IV 2 g (06/28/20 1138)   PRN Meds:.acetaminophen **OR** acetaminophen, ondansetron **OR** ondansetron (ZOFRAN) IV  Antimicrobials: Anti-infectives (From admission, onward)   Start     Dose/Rate Route Frequency Ordered Stop   06/27/20 2359  ceFEPIme (MAXIPIME) 2 g in sodium chloride 0.9 % 100 mL IVPB        2 g 200 mL/hr over 30 Minutes Intravenous Every 12 hours 06/27/20 1328     06/25/20 1700  ceFEPIme (MAXIPIME) 2 g in sodium chloride 0.9 % 100 mL IVPB  Status:  Discontinued        2 g 200 mL/hr over 30 Minutes Intravenous Every 8 hours 06/25/20 1522 06/27/20 1328   06/25/20 0930  ceFEPIme (MAXIPIME) 2 g in sodium chloride 0.9 % 100 mL IVPB  Status:  Discontinued        2 g 200 mL/hr over 30 Minutes Intravenous Every 8 hours 06/25/20 0854 06/25/20 1522   06/25/20 0114  cefTRIAXone (ROCEPHIN) 1 g in sodium chloride 0.9 % 100 mL IVPB  Status:  Discontinued        1 g 200 mL/hr over 30 Minutes Intravenous Every 24 hours 06/25/20 0114 06/25/20 0854   06/24/20 1815  ceFEPIme (MAXIPIME) 2 g in sodium chloride 0.9 % 100 mL IVPB        2 g 200 mL/hr over 30 Minutes Intravenous  Once 06/24/20 1813 06/24/20 1912       I have personally reviewed the following labs and images: CBC: Recent Labs  Lab 06/24/20 1807 06/25/20 0544 06/26/20 0637 06/27/20 0608 06/28/20 0550  WBC 8.5 18.6* 10.9* 8.2 7.3  NEUTROABS 7.8*  --   --   --   --   HGB 14.6 13.4 13.6 12.9* 13.4  HCT 41.5 37.3* 38.3* 36.2* 38.2*  MCV 93.9 93.3 93.2 93.1 92.7  PLT 174 128* 105* 104* 119*   BMP &GFR Recent Labs  Lab 06/24/20 1807 06/25/20 0544 06/26/20 0637 06/27/20 0608 06/28/20 0550  NA 138 137 138 140 141  K 3.8 3.8 3.2* 3.2* 3.7  CL 105 104 106 108 109  CO2 21* 20* 21* 23 24  GLUCOSE 96 103* 102* 86 89  BUN 22 18 23  24* 22  CREATININE 1.35* 1.12 1.52* 1.65* 1.83*  CALCIUM 9.3 8.7* 8.7* 8.3* 8.5*  MG  --   --   --  2.0  --    Estimated Creatinine Clearance: 36.3 mL/min (A) (by C-G formula based on  SCr of 1.83 mg/dL (H)). Liver & Pancreas: Recent Labs  Lab 06/24/20 1807 06/25/20 0544 06/26/20 0637 06/27/20 0608 06/28/20 0550  AST 52* 102* 76* 43* 33  ALT 37 65* 47* 33 28  ALKPHOS 117 98 85 69 68  BILITOT 1.7* 1.7* 1.7* 1.0 1.1  PROT 7.1 6.0* 5.8* 5.2* 5.7*  ALBUMIN 4.2 3.3* 3.2* 2.8* 2.9*   No results for input(s): LIPASE, AMYLASE in the last 168 hours. No results for input(s): AMMONIA in the last  168 hours. Diabetic: No results for input(s): HGBA1C in the last 72 hours. Recent Labs  Lab 06/27/20 2012  GLUCAP 90   Cardiac Enzymes: No results for input(s): CKTOTAL, CKMB, CKMBINDEX, TROPONINI in the last 168 hours. No results for input(s): PROBNP in the last 8760 hours. Coagulation Profile: Recent Labs  Lab 06/24/20 1807 06/25/20 0544  INR 1.1 1.3*   Thyroid Function Tests: No results for input(s): TSH, T4TOTAL, FREET4, T3FREE, THYROIDAB in the last 72 hours. Lipid Profile: No results for input(s): CHOL, HDL, LDLCALC, TRIG, CHOLHDL, LDLDIRECT in the last 72 hours. Anemia Panel: No results for input(s): VITAMINB12, FOLATE, FERRITIN, TIBC, IRON, RETICCTPCT in the last 72 hours. Urine analysis:    Component Value Date/Time   COLORURINE YELLOW 06/24/2020 1828   APPEARANCEUR HAZY (A) 06/24/2020 1828   LABSPEC 1.013 06/24/2020 1828   PHURINE 5.0 06/24/2020 1828   GLUCOSEU NEGATIVE 06/24/2020 1828   HGBUR MODERATE (A) 06/24/2020 1828   BILIRUBINUR NEGATIVE 06/24/2020 1828   KETONESUR NEGATIVE 06/24/2020 1828   PROTEINUR NEGATIVE 06/24/2020 1828   NITRITE NEGATIVE 06/24/2020 1828   LEUKOCYTESUR LARGE (A) 06/24/2020 1828   Sepsis Labs: Invalid input(s): PROCALCITONIN, LACTICIDVEN  Microbiology: Recent Results (from the past 240 hour(s))  Blood Culture (routine x 2)     Status: Abnormal   Collection Time: 06/24/20  6:07 PM   Specimen: BLOOD  Result Value Ref Range Status   Specimen Description   Final    BLOOD LEFT ANTECUBITAL Performed at [facility], [address] [place], [place] [address]    Special Requests   Final    BOTTLES DRAWN AEROBIC AND ANAEROBIC Blood Culture adequate volume Performed at [facility], [address] [place], [place] [address]    Culture  Setup Time   Final    GRAM NEGATIVE RODS IN BOTH AEROBIC AND ANAEROBIC BOTTLES CRITICAL VALUE NOTED.  VALUE IS CONSISTENT WITH PREVIOUSLY REPORTED AND CALLED VALUE.    Culture (A)  Final    CITROBACTER FREUNDII SUSCEPTIBILITIES PERFORMED ON PREVIOUS CULTURE WITHIN THE LAST 5 DAYS. Performed at [facility], [address] [place], [place] [address]    Report Status 06/27/2020 FINAL  Final  Blood Culture (routine x 2)     Status: Abnormal   Collection Time: 06/24/20  6:12 PM   Specimen: BLOOD  Result Value Ref Range Status   Specimen Description   Final    BLOOD RIGHT ANTECUBITAL Performed at [facility], [address] [place], [place] [address]    Special Requests   Final    BOTTLES DRAWN AEROBIC AND ANAEROBIC Blood Culture adequate volume Performed at [facility], [address] [place], [place] [address]    Culture  Setup Time   Final    IN BOTH AEROBIC AND ANAEROBIC BOTTLES GRAM NEGATIVE RODS CRITICAL RESULT CALLED TO, READ BACK BY AND VERIFIED WITH: Kathrynn SpeedHARMD M MICHAELS [id number] FCP Performed at [facility], [address] [place], [place] [address]    Culture CITROBACTER FREUNDII (A)  Final   Report Status 06/27/2020 FINAL  Final   Organism ID, Bacteria CITROBACTER FREUNDII  Final      Susceptibility   Citrobacter freundii - MIC*    CEFAZOLIN >=64 RESISTANT Resistant     CEFEPIME <=0.12 SENSITIVE Sensitive     CEFTAZIDIME <=1 SENSITIVE Sensitive     CEFTRIAXONE 8 RESISTANT Resistant     CIPROFLOXACIN <=0.25 SENSITIVE Sensitive  GENTAMICIN >=16 RESISTANT Resistant     IMIPENEM 1 SENSITIVE Sensitive     TRIMETH/SULFA >=320 RESISTANT Resistant      PIP/TAZO <=4 SENSITIVE Sensitive     * CITROBACTER FREUNDII  Blood Culture ID Panel (Reflexed)     Status: Abnormal   Collection Time: 06/24/20  6:12 PM  Result Value Ref Range Status   Enterococcus faecalis NOT DETECTED NOT DETECTED Final   Enterococcus Faecium NOT DETECTED NOT DETECTED Final   Listeria monocytogenes NOT DETECTED NOT DETECTED Final   Staphylococcus species NOT DETECTED NOT DETECTED Final   Staphylococcus aureus (BCID) NOT DETECTED NOT DETECTED Final   Staphylococcus epidermidis NOT DETECTED NOT DETECTED Final   Staphylococcus lugdunensis NOT DETECTED NOT DETECTED Final   Streptococcus species NOT DETECTED NOT DETECTED Final   Streptococcus agalactiae NOT DETECTED NOT DETECTED Final   Streptococcus pneumoniae NOT DETECTED NOT DETECTED Final   Streptococcus pyogenes NOT DETECTED NOT DETECTED Final   A.calcoaceticus-baumannii NOT DETECTED NOT DETECTED Final   Bacteroides fragilis NOT DETECTED NOT DETECTED Final   Enterobacterales DETECTED (A) NOT DETECTED Final    Comment: Enterobacterales represent a large order of gram negative bacteria, not a single organism. Refer to culture for further identification. CRITICAL RESULT CALLED TO, READ BACK BY AND VERIFIED WITH: PHARMD M MICHAELS [id number] FCP    Enterobacter cloacae complex NOT DETECTED NOT DETECTED Final   Escherichia coli NOT DETECTED NOT DETECTED Final   Klebsiella aerogenes NOT DETECTED NOT DETECTED Final   Klebsiella oxytoca NOT DETECTED NOT DETECTED Final   Klebsiella pneumoniae NOT DETECTED NOT DETECTED Final   Proteus species NOT DETECTED NOT DETECTED Final   Salmonella species NOT DETECTED NOT DETECTED Final   Serratia marcescens NOT DETECTED NOT DETECTED Final   Haemophilus influenzae NOT DETECTED NOT DETECTED Final   Neisseria meningitidis NOT DETECTED NOT DETECTED Final   Pseudomonas aeruginosa NOT DETECTED NOT DETECTED Final   Stenotrophomonas maltophilia NOT DETECTED NOT DETECTED Final   Candida  albicans NOT DETECTED NOT DETECTED Final   Candida auris NOT DETECTED NOT DETECTED Final   Candida glabrata NOT DETECTED NOT DETECTED Final   Candida krusei NOT DETECTED NOT DETECTED Final   Candida parapsilosis NOT DETECTED NOT DETECTED Final   Candida tropicalis NOT DETECTED NOT DETECTED Final   Cryptococcus neoformans/gattii NOT DETECTED NOT DETECTED Final   CTX-M ESBL NOT DETECTED NOT DETECTED Final   Carbapenem resistance IMP NOT DETECTED NOT DETECTED Final   Carbapenem resistance KPC NOT DETECTED NOT DETECTED Final   Carbapenem resistance NDM NOT DETECTED NOT DETECTED Final   Carbapenem resist OXA 48 LIKE NOT DETECTED NOT DETECTED Final   Carbapenem resistance VIM NOT DETECTED NOT DETECTED Final    Comment: Performed at [facility], [address], [place], [place] [address]  Respiratory Panel by RT PCR (Flu A&B, Covid) - Nasopharyngeal Swab     Status: None   Collection Time: 06/24/20  6:26 PM   Specimen: Nasopharyngeal Swab  Result Value Ref Range Status   SARS Coronavirus 2 by RT PCR NEGATIVE NEGATIVE Final    Comment: (NOTE) SARS-CoV-2 target nucleic acids are NOT DETECTED.  The SARS-CoV-2 RNA is generally detectable in upper respiratoy specimens during the acute phase of infection. The lowest concentration of SARS-CoV-2 viral copies this assay can detect is 131 copies/mL. A negative result does not preclude SARS-Cov-2 infection and should not be used as the sole basis for treatment or other patient management decisions. A negative result may occur with  improper specimen collection/handling, submission of specimen other than nasopharyngeal swab, presence of viral mutation(s) within the areas targeted by this assay, and inadequate number of viral copies (<131 copies/mL). A negative result must be combined with clinical observations, patient history, and epidemiological information. The expected result is Negative.  Fact Sheet for Patients:    https://www.moore.com/  Fact Sheet for Healthcare Providers:  https://www.young.biz/  This test is no t yet approved or cleared by the [place] FDA and  has been authorized for detection and/or diagnosis of SARS-CoV-2 by FDA under an Emergency Use Authorization (EUA). This EUA will remain  in effect (meaning this test can be used) for the duration of the COVID-19 declaration under Section 564(b)(1) of the Act, 21 U.S.C. section 360bbb-3(b)(1), unless the authorization is terminated or revoked sooner.     Influenza A by PCR NEGATIVE NEGATIVE Final   Influenza B by PCR NEGATIVE NEGATIVE Final    Comment: (NOTE) The Xpert Xpress SARS-CoV-2/FLU/RSV assay is intended as an aid in  the diagnosis of influenza from Nasopharyngeal swab specimens and  should not be used as a sole basis for treatment. Nasal washings and  aspirates are unacceptable for Xpert Xpress SARS-CoV-2/FLU/RSV  testing.  Fact Sheet for Patients: https://www.moore.com/  Fact Sheet for Healthcare Providers: https://www.young.biz/  This test is not yet approved or cleared by the [place] FDA and  has been authorized for detection and/or diagnosis of SARS-CoV-2 by  FDA under an Emergency Use Authorization (EUA). This EUA will remain  in effect (meaning this test can be used) for the duration of the  Covid-19 declaration under Section 564(b)(1) of the Act, 21  U.S.C. section 360bbb-3(b)(1), unless the authorization is  terminated or revoked. Performed at [facility], [address] [place], [place] [address]   Urine culture     Status: Abnormal   Collection Time: 06/24/20  6:28 PM   Specimen: In/Out Cath Urine  Result Value Ref Range Status   Specimen Description   Final    IN/OUT CATH URINE Performed at [facility], [address] [place], [place] [address]    Special Requests   Final     NONE Performed at [facility], [address] [place], [place] [address]    Culture 70,000 COLONIES/mL CITROBACTER FREUNDII (A)  Final   Report Status 06/26/2020 FINAL  Final   Organism ID, Bacteria CITROBACTER FREUNDII (A)  Final      Susceptibility   Citrobacter freundii - MIC*    CEFAZOLIN >=64 RESISTANT Resistant     CEFEPIME <=0.12 SENSITIVE Sensitive     CEFTRIAXONE <=0.25 SENSITIVE Sensitive     CIPROFLOXACIN <=0.25 SENSITIVE Sensitive     GENTAMICIN >=16 RESISTANT Resistant     IMIPENEM 1 SENSITIVE Sensitive     NITROFURANTOIN <=16 SENSITIVE Sensitive     TRIMETH/SULFA <=20 SENSITIVE Sensitive     PIP/TAZO <=4 SENSITIVE Sensitive     * 70,000 COLONIES/mL CITROBACTER FREUNDII    Radiology Studies: No results found.    Mayumi Summerson T. Anjelita Sheahan [facility]  If 7PM-7AM, please contact night-coverage [email or web address] 06/28/2020, 1:49 PM

## 2020-06-28 NOTE — Progress Notes (Signed)
Patient C/O right flank pain, tylenol given, not effective per wife, MD ordered dilaudid, will continue to assess patient.

## 2020-06-29 ENCOUNTER — Inpatient Hospital Stay (HOSPITAL_COMMUNITY): Payer: Medicare Other

## 2020-06-29 DIAGNOSIS — T83511A Infection and inflammatory reaction due to indwelling urethral catheter, initial encounter: Secondary | ICD-10-CM | POA: Diagnosis not present

## 2020-06-29 DIAGNOSIS — N179 Acute kidney failure, unspecified: Secondary | ICD-10-CM | POA: Diagnosis not present

## 2020-06-29 DIAGNOSIS — N401 Enlarged prostate with lower urinary tract symptoms: Secondary | ICD-10-CM

## 2020-06-29 DIAGNOSIS — A419 Sepsis, unspecified organism: Secondary | ICD-10-CM | POA: Diagnosis not present

## 2020-06-29 DIAGNOSIS — A498 Other bacterial infections of unspecified site: Secondary | ICD-10-CM | POA: Diagnosis not present

## 2020-06-29 DIAGNOSIS — R338 Other retention of urine: Secondary | ICD-10-CM

## 2020-06-29 LAB — RENAL FUNCTION PANEL
Albumin: 3 g/dL — ABNORMAL LOW (ref 3.5–5.0)
Anion gap: 9 (ref 5–15)
BUN: 21 mg/dL (ref 8–23)
CO2: 22 mmol/L (ref 22–32)
Calcium: 8.4 mg/dL — ABNORMAL LOW (ref 8.9–10.3)
Chloride: 106 mmol/L (ref 98–111)
Creatinine, Ser: 1.82 mg/dL — ABNORMAL HIGH (ref 0.61–1.24)
GFR, Estimated: 37 mL/min — ABNORMAL LOW (ref >60)
Glucose, Bld: 91 mg/dL (ref 70–99)
Phosphorus: 2.3 mg/dL — ABNORMAL LOW (ref 2.5–4.6)
Potassium: 3.6 mmol/L (ref 3.5–5.1)
Sodium: 137 mmol/L (ref 135–145)

## 2020-06-29 LAB — GLUCOSE, CAPILLARY: Glucose-Capillary: 90 mg/dL (ref 70–99)

## 2020-06-29 LAB — MAGNESIUM: Magnesium: 2.1 mg/dL (ref 1.7–2.4)

## 2020-06-29 NOTE — Progress Notes (Signed)
William Bishop DQQ:[id number] DOB: 1940-11-11   PCP: [name], MD  Patient is from: Home  DOA: 06/24/2020 LOS: 5  Chief complaints: Confusion and fever  Brief Narrative / Interim history: 79 year old male with history of severe dementia, BPH, urinary retention s/p indwelling Foley that was recently removed presenting with increased confusion and fever, and admitted for severe sepsis due to Citrobacter freundii UTI and bacteremia.  Started on cefepime on 06/24/2020.  Indwelling Foley catheter reinserted due to urinary retention.  Hospital course complicated by AKI.   Subjective: Seen and examined earlier this morning.  No major events overnight or this morning.  Patient's wife at the bedside.  She thinks he had a right flank pain and tenderness yesterday afternoon.  Pain resolved after a dose of IV Dilaudid.  Patient does not recall this.  He currently denies pain but not a reliable historian.  Objective: Vitals:   06/28/20 1435 06/28/20 1740 06/28/20 2023 06/29/20 0522  BP: (!) 148/91 (!) 145/93 (!) 152/90 (!) 145/90  Pulse: 74 72 71 69  Resp: 16 18 18 18   Temp: 98.2 F (36.8 C) 98.2 F (36.8 C) 98.2 F (36.8 C) 97.7 F (36.5 C)  TempSrc: Oral Axillary Oral   SpO2: 98% 97% 98% 99%  Weight:      Height:        Intake/Output Summary (Last 24 hours) at 06/29/2020 1214 Last data filed at 06/29/2020 0816 Gross per 24 hour  Intake 1897.91 ml  Output 1975 ml  Net -77.09 ml   Filed Weights   06/24/20 1826 06/25/20 0116  Weight: 95.3 kg 100.3 kg    Examination:  GENERAL: No apparent distress.  Nontoxic. HEENT: MMM.  Vision and hearing grossly intact.  NECK: Supple.  No apparent JVD.  RESP:  No IWOB.  Fair aeration bilaterally. CVS:  RRR. Heart sounds normal.  ABD/GI/GU: BS+. Abd soft, NTND.  Indwelling Foley. MSK/EXT:  Moves extremities. No apparent deformity. No edema.  SKIN: no apparent skin lesion or wound NEURO: Awake.  Only oriented to  self.  No apparent focal neuro deficit. PSYCH: Calm. Normal affect.  Procedures:  None  Microbiology summarized: 11/17-COVID-19, influenza and RSV PCR nonreactive. 11/17-urine culture and blood cultures with Citrobacter freundii   Assessment & Plan: Severe sepsis due to Citrobacter freundii bacteremia and UTI: POA.  Both urine and blood cultures grew Citrobacter freundii sensitive to cefepime.  Sepsis physiology resolved. -Continue IV cefepime 11/17 through 11/23. No great p.o. option  Catheter associated Citrobacter freundii UTI-Foley removed prior to admit but reinserted due to urinary retention. -Antibiotics as above. -Continue indwelling Foley catheter on discharge.  BPH/urinary retention: Foley catheter reinserted -Start Flomax -Continue indwelling Foley catheter on discharge. -Voiding trial at urology office at follow-up.  Toxic metabolic encephalopathy likely due to severe sepsis and underlying dementia. -Treat sepsis as above -Reorientation and delirium precautions  Constipation: Resolved. -Bowel regimen as needed  AKI: Baseline Cr 1.2-1.3> 1.35 (admit)>> 1.12>> 1.83> 1.18.  BUN 22.  Likely due to sepsis and obstructive uropathy.  Excellent urine output.  Creatinine seems to be plateauing. -Continue indwelling Foley catheter -Continue IV NS at 100 cc an hour -Avoid nephrotoxic meds -Recheck bmet in AM  Hypokalemia: Resolved.  Thrombocytopenia: Platelet 119.  Improving.  Body mass index is 35.69 kg/m.         DVT prophylaxis:  enoxaparin (LOVENOX) injection 40 mg Start: 06/25/20 0600  Code Status: DNR/DNI Family Communication: Updated patient's wife at bedside. Status is: Inpatient  Remains inpatient appropriate because:IV treatments appropriate due to intensity of illness or inability to take PO   Dispo: The patient is from: Home              Anticipated d/c is to: Home              Anticipated d/c date is: 2 days              Patient currently  is not medically stable to d/c.       Consultants:  Infectious disease over the phone   Sch Meds:  Scheduled Meds: . Chlorhexidine Gluconate Cloth  6 each Topical Daily  . enoxaparin (LOVENOX) injection  40 mg Subcutaneous Q24H  . haloperidol lactate  2 mg Intravenous Once  . tamsulosin  0.4 mg Oral QPC supper   Continuous Infusions: . sodium chloride 100 mL/hr at 06/29/20 4098  . ceFEPime (MAXIPIME) IV Stopped (06/28/20 2253)   PRN Meds:.acetaminophen **OR** acetaminophen, HYDROmorphone (DILAUDID) injection, ondansetron **OR** ondansetron (ZOFRAN) IV  Antimicrobials: Anti-infectives (From admission, onward)   Start     Dose/Rate Route Frequency Ordered Stop   06/27/20 2359  ceFEPIme (MAXIPIME) 2 g in sodium chloride 0.9 % 100 mL IVPB        2 g 200 mL/hr over 30 Minutes Intravenous Every 12 hours 06/27/20 1328     06/25/20 1700  ceFEPIme (MAXIPIME) 2 g in sodium chloride 0.9 % 100 mL IVPB  Status:  Discontinued        2 g 200 mL/hr over 30 Minutes Intravenous Every 8 hours 06/25/20 1522 06/27/20 1328   06/25/20 0930  ceFEPIme (MAXIPIME) 2 g in sodium chloride 0.9 % 100 mL IVPB  Status:  Discontinued        2 g 200 mL/hr over 30 Minutes Intravenous Every 8 hours 06/25/20 0854 06/25/20 1522   06/25/20 0114  cefTRIAXone (ROCEPHIN) 1 g in sodium chloride 0.9 % 100 mL IVPB  Status:  Discontinued        1 g 200 mL/hr over 30 Minutes Intravenous Every 24 hours 06/25/20 0114 06/25/20 0854   06/24/20 1815  ceFEPIme (MAXIPIME) 2 g in sodium chloride 0.9 % 100 mL IVPB        2 g 200 mL/hr over 30 Minutes Intravenous  Once 06/24/20 1813 06/24/20 1912       I have personally reviewed the following labs and images: CBC: Recent Labs  Lab 06/24/20 1807 06/25/20 0544 06/26/20 0637 06/27/20 0608 06/28/20 0550  WBC 8.5 18.6* 10.9* 8.2 7.3  NEUTROABS 7.8*  --   --   --   --   HGB 14.6 13.4 13.6 12.9* 13.4  HCT 41.5 37.3* 38.3* 36.2* 38.2*  MCV 93.9 93.3 93.2 93.1 92.7  PLT  174 128* 105* 104* 119*   BMP &GFR Recent Labs  Lab 06/25/20 0544 06/26/20 0637 06/27/20 0608 06/28/20 0550 06/29/20 0540  NA 137 138 140 141 137  K 3.8 3.2* 3.2* 3.7 3.6  CL 104 106 108 109 106  CO2 20* 21* GLUCOSE 103* 102* 86 89 91  BUN 18 23 24* 22 21  CREATININE 1.12 1.52* 1.65* 1.83* 1.82*  CALCIUM 8.7* 8.7* 8.3* 8.5* 8.4*  MG  --   --  2.0  --  2.1  PHOS  --   --   --   --  2.3*   Estimated Creatinine Clearance: 36.5 mL/min (A) (by C-G formula based on SCr of 1.82 mg/dL (H)). Liver &  Pancreas: Recent Labs  Lab 06/24/20 1807 06/24/20 1807 06/25/20 0544 06/26/20 0637 06/27/20 0608 06/28/20 0550 06/29/20 0540  AST 52*  --  102* 76* 43* 33  --   ALT 37  --  65* 47* 33 28  --   ALKPHOS 117  --  98 85 69 68  --   BILITOT 1.7*  --  1.7* 1.7* 1.0 1.1  --   PROT 7.1  --  6.0* 5.8* 5.2* 5.7*  --   ALBUMIN 4.2   < > 3.3* 3.2* 2.8* 2.9* 3.0*   < > = values in this interval not displayed.   No results for input(s): LIPASE, AMYLASE in the last 168 hours. No results for input(s): AMMONIA in the last 168 hours. Diabetic: No results for input(s): HGBA1C in the last 72 hours. Recent Labs  Lab 06/27/20 2012  GLUCAP 90   Cardiac Enzymes: No results for input(s): CKTOTAL, CKMB, CKMBINDEX, TROPONINI in the last 168 hours. No results for input(s): PROBNP in the last 8760 hours. Coagulation Profile: Recent Labs  Lab 06/24/20 1807 06/25/20 0544  INR 1.1 1.3*   Thyroid Function Tests: No results for input(s): TSH, T4TOTAL, FREET4, T3FREE, THYROIDAB in the last 72 hours. Lipid Profile: No results for input(s): CHOL, HDL, LDLCALC, TRIG, CHOLHDL, LDLDIRECT in the last 72 hours. Anemia Panel: No results for input(s): VITAMINB12, FOLATE, FERRITIN, TIBC, IRON, RETICCTPCT in the last 72 hours. Urine analysis:    Component Value Date/Time   COLORURINE YELLOW 06/24/2020 1828   APPEARANCEUR HAZY (A) 06/24/2020 1828   LABSPEC 1.013 06/24/2020 1828   PHURINE 5.0  06/24/2020 1828   GLUCOSEU NEGATIVE 06/24/2020 1828   HGBUR MODERATE (A) 06/24/2020 1828   BILIRUBINUR NEGATIVE 06/24/2020 1828   KETONESUR NEGATIVE 06/24/2020 1828   PROTEINUR NEGATIVE 06/24/2020 1828   NITRITE NEGATIVE 06/24/2020 1828   LEUKOCYTESUR LARGE (A) 06/24/2020 1828   Sepsis Labs: Invalid input(s): PROCALCITONIN, LACTICIDVEN  Microbiology: Recent Results (from the past 240 hour(s))  Blood Culture (routine x 2)     Status: Abnormal   Collection Time: 06/24/20  6:07 PM   Specimen: BLOOD  Result Value Ref Range Status   Specimen Description   Final    BLOOD LEFT ANTECUBITAL Performed at [facility], [address] [place], [place] [address]    Special Requests   Final    BOTTLES DRAWN AEROBIC AND ANAEROBIC Blood Culture adequate volume Performed at [facility], [address] [place], [place] [address]    Culture  Setup Time   Final    GRAM NEGATIVE RODS IN BOTH AEROBIC AND ANAEROBIC BOTTLES CRITICAL VALUE NOTED.  VALUE IS CONSISTENT WITH PREVIOUSLY REPORTED AND CALLED VALUE.    Culture (A)  Final    CITROBACTER FREUNDII SUSCEPTIBILITIES PERFORMED ON PREVIOUS CULTURE WITHIN THE LAST 5 DAYS. Performed at [facility], [address] [place], [place] [address]    Report Status 06/27/2020 FINAL  Final  Blood Culture (routine x 2)     Status: Abnormal   Collection Time: 06/24/20  6:12 PM   Specimen: BLOOD  Result Value Ref Range Status   Specimen Description   Final    BLOOD RIGHT ANTECUBITAL Performed at [facility], [address], [place], [place] [address]    Special Requests   Final    BOTTLES DRAWN AEROBIC AND ANAEROBIC Blood Culture adequate volume Performed at [facility], [address], [place], [place] [address]    Culture  Setup Time   Final    IN BOTH AEROBIC AND ANAEROBIC BOTTLES GRAM NEGATIVE RODS CRITICAL RESULT CALLED TO, READ BACK BY AND VERIFIED WITH:  Reeves Forth [id number] FCP Performed at [facility], [address] [place], [place] [address]    Culture CITROBACTER FREUNDII (A)  Final   Report Status 06/27/2020 FINAL  Final   Organism ID, Bacteria CITROBACTER FREUNDII  Final      Susceptibility   Citrobacter freundii - MIC*    CEFAZOLIN >=64 RESISTANT Resistant     CEFEPIME <=0.12 SENSITIVE Sensitive     CEFTAZIDIME <=1 SENSITIVE Sensitive     CEFTRIAXONE 8 RESISTANT Resistant     CIPROFLOXACIN <=0.25 SENSITIVE Sensitive     GENTAMICIN >=16 RESISTANT Resistant     IMIPENEM 1 SENSITIVE Sensitive     TRIMETH/SULFA >=320 RESISTANT Resistant     PIP/TAZO <=4 SENSITIVE Sensitive     * CITROBACTER FREUNDII  Blood Culture ID Panel (Reflexed)     Status: Abnormal   Collection Time: 06/24/20  6:12 PM  Result Value Ref Range Status   Enterococcus faecalis NOT DETECTED NOT DETECTED Final   Enterococcus Faecium NOT DETECTED NOT DETECTED Final   Listeria monocytogenes NOT DETECTED NOT DETECTED Final   Staphylococcus species NOT DETECTED NOT DETECTED Final   Staphylococcus aureus (BCID) NOT DETECTED NOT DETECTED Final   Staphylococcus epidermidis NOT DETECTED NOT DETECTED Final   Staphylococcus lugdunensis NOT DETECTED NOT DETECTED Final   Streptococcus species NOT DETECTED NOT DETECTED Final   Streptococcus agalactiae NOT DETECTED NOT DETECTED Final   Streptococcus pneumoniae NOT DETECTED NOT DETECTED Final   Streptococcus pyogenes NOT DETECTED NOT DETECTED Final   A.calcoaceticus-baumannii NOT DETECTED NOT DETECTED Final   Bacteroides fragilis NOT DETECTED NOT DETECTED Final   Enterobacterales DETECTED (A) NOT DETECTED Final    Comment: Enterobacterales represent a large order of gram negative bacteria, not a single organism. Refer to culture for further identification. CRITICAL RESULT CALLED TO, READ BACK BY AND VERIFIED WITH: PHARMD M MICHAELS [id number] FCP    Enterobacter cloacae complex NOT DETECTED NOT  DETECTED Final   Escherichia coli NOT DETECTED NOT DETECTED Final   Klebsiella aerogenes NOT DETECTED NOT DETECTED Final   Klebsiella oxytoca NOT DETECTED NOT DETECTED Final   Klebsiella pneumoniae NOT DETECTED NOT DETECTED Final   Proteus species NOT DETECTED NOT DETECTED Final   Salmonella species NOT DETECTED NOT DETECTED Final   Serratia marcescens NOT DETECTED NOT DETECTED Final   Haemophilus influenzae NOT DETECTED NOT DETECTED Final   Neisseria meningitidis NOT DETECTED NOT DETECTED Final   Pseudomonas aeruginosa NOT DETECTED NOT DETECTED Final   Stenotrophomonas maltophilia NOT DETECTED NOT DETECTED Final   Candida albicans NOT DETECTED NOT DETECTED Final   Candida auris NOT DETECTED NOT DETECTED Final   Candida glabrata NOT DETECTED NOT DETECTED Final   Candida krusei NOT DETECTED NOT DETECTED Final   Candida parapsilosis NOT DETECTED NOT DETECTED Final   Candida tropicalis NOT DETECTED NOT DETECTED Final   Cryptococcus neoformans/gattii NOT DETECTED NOT DETECTED Final   CTX-M ESBL NOT DETECTED NOT DETECTED Final   Carbapenem resistance IMP NOT DETECTED NOT DETECTED Final   Carbapenem resistance KPC NOT DETECTED NOT DETECTED Final   Carbapenem resistance NDM NOT DETECTED NOT DETECTED Final   Carbapenem resist OXA 48 LIKE NOT DETECTED NOT DETECTED Final   Carbapenem resistance VIM NOT DETECTED NOT DETECTED Final    Comment: Performed at [facility], [address] [place], [place] [address]  Respiratory Panel by RT PCR (Flu A&B, Covid) - Nasopharyngeal Swab     Status: None   Collection Time: 06/24/20  6:26 PM   Specimen: Nasopharyngeal Swab  Result Value Ref Range Status   SARS Coronavirus 2 by RT PCR NEGATIVE NEGATIVE Final    Comment: (NOTE) SARS-CoV-2 target nucleic acids are NOT DETECTED.  The SARS-CoV-2 RNA is generally detectable in upper respiratoy specimens during the acute phase of infection. The lowest concentration of SARS-CoV-2 viral copies this assay  can detect is 131 copies/mL. A negative result does not preclude SARS-Cov-2 infection and should not be used as the sole basis for treatment or other patient management decisions. A negative result may occur with  improper specimen collection/handling, submission of specimen other than nasopharyngeal swab, presence of viral mutation(s) within the areas targeted by this assay, and inadequate number of viral copies (<131 copies/mL). A negative result must be combined with clinical observations, patient history, and epidemiological information. The expected result is Negative.  Fact Sheet for Patients:  https://www.moore.com/https://www.fda.gov/media/142436/download  Fact Sheet for Healthcare Providers:  https://www.young.biz/https://www.fda.gov/media/142435/download  This test is no t yet approved or cleared by the [place] FDA and  has been authorized for detection and/or diagnosis of SARS-CoV-2 by FDA under an Emergency Use Authorization (EUA). This EUA will remain  in effect (meaning this test can be used) for the duration of the COVID-19 declaration under Section 564(b)(1) of the Act, 21 U.S.C. section 360bbb-3(b)(1), unless the authorization is terminated or revoked sooner.     Influenza A by PCR NEGATIVE NEGATIVE Final   Influenza B by PCR NEGATIVE NEGATIVE Final    Comment: (NOTE) The Xpert Xpress SARS-CoV-2/FLU/RSV assay is intended as an aid in  the diagnosis of influenza from Nasopharyngeal swab specimens and  should not be used as a sole basis for treatment. Nasal washings and  aspirates are unacceptable for Xpert Xpress SARS-CoV-2/FLU/RSV  testing.  Fact Sheet for Patients: https://www.moore.com/https://www.fda.gov/media/142436/download  Fact Sheet for Healthcare Providers: https://www.young.biz/https://www.fda.gov/media/142435/download  This test is not yet approved or cleared by the [place] FDA and  has been authorized for detection and/or diagnosis of SARS-CoV-2 by  FDA under an Emergency Use Authorization (EUA). This EUA will remain    in effect (meaning this test can be used) for the duration of the  Covid-19 declaration under Section 564(b)(1) of the Act, 21  U.S.C. section 360bbb-3(b)(1), unless the authorization is  terminated or revoked. Performed at [facility], [address] [place], [place] [address]   Urine culture     Status: Abnormal   Collection Time: 06/24/20  6:28 PM   Specimen: In/Out Cath Urine  Result Value Ref Range Status   Specimen Description   Final    IN/OUT CATH URINE Performed at [facility], [address] [place], [place] [address]    Special Requests   Final    NONE Performed at [facility], [address] [place], [place] [address]    Culture 70,000 COLONIES/mL CITROBACTER FREUNDII (A)  Final   Report Status 06/26/2020 FINAL  Final   Organism ID, Bacteria CITROBACTER FREUNDII (A)  Final      Susceptibility   Citrobacter freundii - MIC*    CEFAZOLIN >=64 RESISTANT Resistant     CEFEPIME <=0.12 SENSITIVE Sensitive     CEFTRIAXONE <=0.25 SENSITIVE Sensitive     CIPROFLOXACIN <=0.25 SENSITIVE Sensitive     GENTAMICIN >=16 RESISTANT Resistant  IMIPENEM 1 SENSITIVE Sensitive     NITROFURANTOIN <=16 SENSITIVE Sensitive     TRIMETH/SULFA <=20 SENSITIVE Sensitive     PIP/TAZO <=4 SENSITIVE Sensitive     * 70,000 COLONIES/mL CITROBACTER FREUNDII    Radiology Studies: No results found.    Kailey Esquilin T. Javionna Leder [facility]  If 7PM-7AM, please contact night-coverage [email or web address] 06/29/2020, 12:14 PM

## 2020-06-29 NOTE — Progress Notes (Signed)
Physical Therapy Treatment Patient Details Name: William Bishop MRN: [id number] DOB: [birth year] Today's Date: 06/29/2020    History of Present Illness Pt is 79 yo male with PMH of significant dementia, urinary retention, and recent constipation. Pt presented with increased confusion and fevers.  Pt was found to be septic with UTI.    PT Comments    Pt pleasantly confused with hx of dementia.  Pt requiring a little assist however only for cueing.  Spouse present and assisted with motivating pt.  Pt likes Rosanne Sack, so left [name]'s greatest hits playing on computer in room.  Spouse reports pt doesn't use RW at baseline so guided pt with HHA.    Follow Up Recommendations  Supervision/Assistance - 24 hour;No PT follow up     Equipment Recommendations  None recommended by PT    Recommendations for Other Services       Precautions / Restrictions Precautions Precautions: Fall    Mobility  Bed Mobility Overal bed mobility: Needs Assistance Bed Mobility: Supine to Sit     Supine to sit: Min assist     General bed mobility comments: assist for cueing  Transfers Overall transfer level: Needs assistance Equipment used: None Transfers: Sit to/from Stand Sit to Stand: Min guard         General transfer comment: for safety  Ambulation/Gait Ambulation/Gait assistance: Min guard Gait Distance (Feet): 400 Feet Assistive device: 2 person hand held assist Gait Pattern/deviations: Decreased stride length;Step-through pattern     General Gait Details: min guard for safety; bil HHA for guiding pt (spouse reports he doesn't use RW)   Stairs             Wheelchair Mobility    Modified Rankin (Stroke Patients Only)       Balance Overall balance assessment: Needs assistance         Standing balance support: No upper extremity supported Standing balance-Leahy Scale: Fair                              Cognition Arousal/Alertness:  Awake/alert Behavior During Therapy: WFL for tasks assessed/performed Overall Cognitive Status: History of cognitive impairments - at baseline                                 General Comments: Pt with hx of dementia.      Exercises      General Comments        Pertinent Vitals/Pain Pain Assessment: No/denies pain    Home Living                      Prior Function            PT Goals (current goals can now be found in the care plan section) Progress towards PT goals: Progressing toward goals    Frequency    Min 3X/week      PT Plan Current plan remains appropriate    Co-evaluation              AM-PAC PT "6 Clicks" Mobility   Outcome Measure  Help needed turning from your back to your side while in a flat bed without using bedrails?: A Little Help needed moving from lying on your back to sitting on the side of a flat bed without using bedrails?: A Little Help needed moving to and from  a bed to a chair (including a wheelchair)?: A Little Help needed standing up from a chair using your arms (e.g., wheelchair or bedside chair)?: A Little Help needed to walk in hospital room?: A Little Help needed climbing 3-5 steps with a railing? : A Little 6 Click Score: 18    End of Session Equipment Utilized During Treatment: Gait belt Activity Tolerance: Patient tolerated treatment well Patient left: with call bell/phone within reach;in chair;with chair alarm set;with family/visitor present Nurse Communication: Mobility status PT Visit Diagnosis: Unsteadiness on feet (R26.81)     Time: 5056-9794 PT Time Calculation (min) (ACUTE ONLY): 23 min  Charges:  $Gait Training: 8-22 mins                     Paulino Door, DPT Acute Rehabilitation Services Pager: [phone] Office: [phone]  Sarajane Jews 06/29/2020, 12:49 PM

## 2020-06-29 NOTE — Plan of Care (Signed)
  Problem: Pain Managment: Goal: General experience of comfort will improve Outcome: Progressing  No C/O pain.

## 2020-06-30 ENCOUNTER — Encounter (HOSPITAL_COMMUNITY)
Admission: EM | Disposition: A | Discharge status: Hospice | Payer: Self-pay | Source: Home / Self Care | Attending: Internal Medicine

## 2020-06-30 ENCOUNTER — Inpatient Hospital Stay (HOSPITAL_COMMUNITY): Payer: Medicare Other | Admitting: Certified Registered Nurse Anesthetist

## 2020-06-30 ENCOUNTER — Inpatient Hospital Stay (HOSPITAL_COMMUNITY): Payer: Medicare Other

## 2020-06-30 ENCOUNTER — Encounter (HOSPITAL_COMMUNITY): Payer: Self-pay | Admitting: Internal Medicine

## 2020-06-30 DIAGNOSIS — N179 Acute kidney failure, unspecified: Secondary | ICD-10-CM | POA: Diagnosis not present

## 2020-06-30 DIAGNOSIS — A498 Other bacterial infections of unspecified site: Secondary | ICD-10-CM | POA: Diagnosis not present

## 2020-06-30 DIAGNOSIS — A419 Sepsis, unspecified organism: Secondary | ICD-10-CM | POA: Diagnosis not present

## 2020-06-30 DIAGNOSIS — N201 Calculus of ureter: Secondary | ICD-10-CM

## 2020-06-30 DIAGNOSIS — T83511A Infection and inflammatory reaction due to indwelling urethral catheter, initial encounter: Secondary | ICD-10-CM | POA: Diagnosis not present

## 2020-06-30 HISTORY — PX: CYSTOSCOPY W/ URETERAL STENT PLACEMENT: SHX1429

## 2020-06-30 LAB — RENAL FUNCTION PANEL
Albumin: 3.1 g/dL — ABNORMAL LOW (ref 3.5–5.0)
Anion gap: 9 (ref 5–15)
BUN: 20 mg/dL (ref 8–23)
CO2: 20 mmol/L — ABNORMAL LOW (ref 22–32)
Calcium: 8.4 mg/dL — ABNORMAL LOW (ref 8.9–10.3)
Chloride: 109 mmol/L (ref 98–111)
Creatinine, Ser: 1.65 mg/dL — ABNORMAL HIGH (ref 0.61–1.24)
GFR, Estimated: 42 mL/min — ABNORMAL LOW (ref >60)
Glucose, Bld: 88 mg/dL (ref 70–99)
Phosphorus: 2.6 mg/dL (ref 2.5–4.6)
Potassium: 3.7 mmol/L (ref 3.5–5.1)
Sodium: 138 mmol/L (ref 135–145)

## 2020-06-30 LAB — SURGICAL PCR SCREEN
MRSA, PCR: NEGATIVE
Staphylococcus aureus: NEGATIVE

## 2020-06-30 LAB — MAGNESIUM: Magnesium: 2.1 mg/dL (ref 1.7–2.4)

## 2020-06-30 SURGERY — CYSTOSCOPY, WITH RETROGRADE PYELOGRAM AND URETERAL STENT INSERTION
Anesthesia: General | Laterality: Right

## 2020-06-30 MED ORDER — DEXMEDETOMIDINE (PRECEDEX) IN NS 20 MCG/5ML (4 MCG/ML) IV SYRINGE
PREFILLED_SYRINGE | INTRAVENOUS | Status: DC | PRN
  Administered 2020-06-30: 12 ug via INTRAVENOUS

## 2020-06-30 MED ORDER — LACTATED RINGERS IV SOLN: Freq: Once | INTRAVENOUS | Status: AC

## 2020-06-30 MED ORDER — OXYCODONE HCL 5 MG PO TABS: 5.0000 mg | ORAL_TABLET | Freq: Once | ORAL | Status: DC | PRN

## 2020-06-30 MED ORDER — ONDANSETRON HCL 4 MG/2ML IJ SOLN: 4.0000 mg | Freq: Once | INTRAMUSCULAR | Status: DC | PRN

## 2020-06-30 MED ORDER — FENTANYL CITRATE (PF) 100 MCG/2ML IJ SOLN: 25.0000 ug | INTRAMUSCULAR | Status: DC | PRN

## 2020-06-30 MED ORDER — AMISULPRIDE (ANTIEMETIC) 5 MG/2ML IV SOLN: 10.0000 mg | Freq: Once | INTRAVENOUS | Status: DC | PRN

## 2020-06-30 MED ORDER — OXYCODONE HCL 5 MG/5ML PO SOLN: 5.0000 mg | Freq: Once | ORAL | Status: DC | PRN

## 2020-06-30 MED ORDER — DEXMEDETOMIDINE (PRECEDEX) IN NS 20 MCG/5ML (4 MCG/ML) IV SYRINGE
PREFILLED_SYRINGE | INTRAVENOUS | Status: AC
  Filled 2020-06-30: qty 5

## 2020-06-30 MED ORDER — FENTANYL CITRATE (PF) 100 MCG/2ML IJ SOLN
INTRAMUSCULAR | Status: DC | PRN
  Administered 2020-06-30: 50 ug via INTRAVENOUS

## 2020-06-30 MED ORDER — FENTANYL CITRATE (PF) 100 MCG/2ML IJ SOLN
INTRAMUSCULAR | Status: AC
  Filled 2020-06-30: qty 2

## 2020-06-30 MED ORDER — PROPOFOL 10 MG/ML IV BOLUS
INTRAVENOUS | Status: DC | PRN
  Administered 2020-06-30: 120 mg via INTRAVENOUS

## 2020-06-30 MED ORDER — LIDOCAINE HCL (CARDIAC) PF 100 MG/5ML IV SOSY
PREFILLED_SYRINGE | INTRAVENOUS | Status: DC | PRN
  Administered 2020-06-30: 60 mg via INTRAVENOUS

## 2020-06-30 MED ORDER — IOHEXOL 300 MG/ML  SOLN
INTRAMUSCULAR | Status: DC | PRN
  Administered 2020-06-30: 10 mL

## 2020-06-30 MED ORDER — DEXAMETHASONE SODIUM PHOSPHATE 10 MG/ML IJ SOLN
INTRAMUSCULAR | Status: DC | PRN
  Administered 2020-06-30: 5 mg via INTRAVENOUS

## 2020-06-30 MED ORDER — ONDANSETRON HCL 4 MG/2ML IJ SOLN
INTRAMUSCULAR | Status: DC | PRN
  Administered 2020-06-30: 4 mg via INTRAVENOUS

## 2020-06-30 MED ORDER — FINASTERIDE 5 MG PO TABS
5.0000 mg | ORAL_TABLET | Freq: Every day | ORAL | Status: DC
  Administered 2020-06-30 – 2020-07-07 (×7): 5 mg via ORAL
  Filled 2020-06-30 (×8): qty 1

## 2020-06-30 MED ORDER — LACTATED RINGERS IV SOLN: INTRAVENOUS | Status: DC | PRN

## 2020-06-30 MED ORDER — PHENYLEPHRINE 40 MCG/ML (10ML) SYRINGE FOR IV PUSH (FOR BLOOD PRESSURE SUPPORT)
PREFILLED_SYRINGE | INTRAVENOUS | Status: DC | PRN
  Administered 2020-06-30 (×2): 80 ug via INTRAVENOUS

## 2020-06-30 MED ORDER — STERILE WATER FOR IRRIGATION IR SOLN
Status: DC | PRN
  Administered 2020-06-30: 3000 mL

## 2020-06-30 SURGICAL SUPPLY — 21 items
BAG DRN RND TRDRP ANRFLXCHMBR (UROLOGICAL SUPPLIES) ×1
BAG URINE DRAIN 2000ML AR STRL (UROLOGICAL SUPPLIES) ×2 IMPLANT
BAG URO CATCHER STRL LF (MISCELLANEOUS) ×3 IMPLANT
BASKET ZERO TIP NITINOL 2.4FR (BASKET) IMPLANT
BSKT STON RTRVL ZERO TP 2.4FR (BASKET)
CATH FOLEY 2WAY SLVR  5CC 14FR (CATHETERS) ×3
CATH FOLEY 2WAY SLVR 5CC 14FR (CATHETERS) IMPLANT
CATH INTERMIT  6FR 70CM (CATHETERS) ×2 IMPLANT
CLOTH BEACON ORANGE TIMEOUT ST (SAFETY) ×3 IMPLANT
GLOVE BIOGEL M STRL SZ7.5 (GLOVE) ×3 IMPLANT
GOWN STRL REUS W/TWL LRG LVL3 (GOWN DISPOSABLE) ×3 IMPLANT
GUIDEWIRE ANG ZIPWIRE 038X150 (WIRE) ×3 IMPLANT
GUIDEWIRE STR DUAL SENSOR (WIRE) IMPLANT
KIT TURNOVER KIT A (KITS) IMPLANT
MANIFOLD NEPTUNE II (INSTRUMENTS) ×3 IMPLANT
PACK CYSTO (CUSTOM PROCEDURE TRAY) ×3 IMPLANT
STENT POLARIS 5FRX26 (STENTS) ×2 IMPLANT
SYR 10ML LL (SYRINGE) ×2 IMPLANT
TUBING CONNECTING 10 (TUBING) ×2 IMPLANT
TUBING CONNECTING 10' (TUBING) ×1
TUBING UROLOGY SET (TUBING) IMPLANT

## 2020-06-30 NOTE — Anesthesia Postprocedure Evaluation (Signed)
Anesthesia Post Note  Patient: William Bishop  Procedure(s) Performed: CYSTOSCOPY WITH RETROGRADE PYELOGRAM/URETERAL STENT PLACEMENT (Right )     Patient location during evaluation: PACU Anesthesia Type: General Level of consciousness: awake and alert Pain management: pain level controlled Vital Signs Assessment: post-procedure vital signs reviewed and stable Respiratory status: spontaneous breathing, nonlabored ventilation and respiratory function stable Cardiovascular status: blood pressure returned to baseline and stable Postop Assessment: no apparent nausea or vomiting Anesthetic complications: no   No complications documented.  Last Vitals:  Vitals:   06/30/20 2030 06/30/20 2045  BP: 116/64 (!) 151/87  Pulse: 81 96  Resp: 18 19  Temp: 36.6 C   SpO2: 94% 98%    Last Pain:  Vitals:   06/30/20 1249  TempSrc: Oral  PainSc:                  Lucretia Kern

## 2020-06-30 NOTE — Care Management Important Message (Signed)
Important Message  Patient Details IM Letter given to the Patient. Name: William Bishop MRN: [id number] Date of Birth: 1941/04/08   Medicare Important Message Given:  Yes     Caren Macadam 06/30/2020, 11:18 AM

## 2020-06-30 NOTE — Consult Note (Signed)
Reason for Consult:Right Ureteral / Renal Stones, Urinary Retention, Recent Urosepsis  Referring Physician: Candelaria Stagers MD  William Bishop is an 79 y.o. male.   HPI:   1 - Right Ureteral / Renal Stones - New fRt flank pain 11/22 during hospitalization for recurrent urinary retention + infection and found to have 27mm Rt prox ureteral stone + scatterd Rt renal stones by CT. Amazingly these were renal and non-obstructing jsut few days piror by renal US 11/20.   2 -  Urinary Retention - on / off urinary retention 2021. Follows [facility]. Passed recetn voiding trial with minmal PVR but then recurrent retention this admission.  3 -  Recent Urosepsis - Citrobacter urosepsis sens cipro, bactrim, nitro. Now on cefipime.   4 - Renal Insufficieny - Baselin Cr 1.5's form medical renal disease.   PMH sig for dementia (wife Fulton Mole helps in all ADL's they live at home), lap chole. NO ischemic CV disease / blood thinners.   Today " William Bishop" is seen in consultation for above.   Past Medical History:  Diagnosis Date  . Dementia (HCC)   . Renal disorder     Past Surgical History:  Procedure Laterality Date  . arm fracture surgery    . CHOLECYSTECTOMY N/A 11/06/2018   Procedure: LAPAROSCOPIC CHOLECYSTECTOMY WITH INTRAOPERATIVE CHOLANGIOGRAM;  Surgeon: Ovidio Kin, MD;  Location: [facility];  Service: General;  Laterality: N/A;    Family History  Family history unknown: Yes    Social History:  reports that he has never smoked. He has quit using smokeless tobacco.  His smokeless tobacco use included chew. He reports that he does not drink alcohol and does not use drugs.  Allergies: No Known Allergies  Medications: I have reviewed the patient's current medications.  Results for orders placed or performed during the hospital encounter of 06/24/20 (from the past 48 hour(s))  Glucose, capillary     Status: None   Collection Time: 06/28/20  2:55 PM  Result Value Ref Range   Glucose-Capillary 90 70 -  99 mg/dL    Comment: Glucose reference range applies only to samples taken after fasting for at least 8 hours.  Renal function panel     Status: Abnormal   Collection Time: 06/29/20  5:40 AM  Result Value Ref Range   Sodium 137 135 - 145 mmol/L   Potassium 3.6 3.5 - 5.1 mmol/L   Chloride 106 98 - 111 mmol/L   CO2 22 22 - 32 mmol/L   Glucose, Bld 91 70 - 99 mg/dL    Comment: Glucose reference range applies only to samples taken after fasting for at least 8 hours.   BUN 21 8 - 23 mg/dL   Creatinine, Ser 0.48 (H) 0.61 - 1.24 mg/dL   Calcium 8.4 (L) 8.9 - 10.3 mg/dL   Phosphorus 2.3 (L) 2.5 - 4.6 mg/dL   Albumin 3.0 (L) 3.5 - 5.0 g/dL   GFR, Estimated 37 (L) >60 mL/min    Comment: (NOTE) Calculated using the CKD-EPI Creatinine Equation (2021)    Anion gap 9 5 - 15    Comment: Performed at [facility], [address], [place], [place] [address]  Magnesium     Status: None   Collection Time: 06/29/20  5:40 AM  Result Value Ref Range   Magnesium 2.1 1.7 - 2.4 mg/dL    Comment: Performed at [facility], [address], [place], [place] [address]  Renal function panel     Status: Abnormal  Collection Time: 06/30/20  5:43 AM  Result Value Ref Range   Sodium 138 135 - 145 mmol/L   Potassium 3.7 3.5 - 5.1 mmol/L   Chloride 109 98 - 111 mmol/L   CO2 20 (L) 22 - 32 mmol/L   Glucose, Bld 88 70 - 99 mg/dL    Comment: Glucose reference range applies only to samples taken after fasting for at least 8 hours.   BUN 20 8 - 23 mg/dL   Creatinine, Ser 9.52 (H) 0.61 - 1.24 mg/dL   Calcium 8.4 (L) 8.9 - 10.3 mg/dL   Phosphorus 2.6 2.5 - 4.6 mg/dL   Albumin 3.1 (L) 3.5 - 5.0 g/dL   GFR, Estimated 42 (L) >60 mL/min    Comment: (NOTE) Calculated using the CKD-EPI Creatinine Equation (2021)    Anion gap 9 5 - 15    Comment: Performed at [facility], [address], [place], [place] [address]  Magnesium     Status: None   Collection Time:  06/30/20  5:43 AM  Result Value Ref Range   Magnesium 2.1 1.7 - 2.4 mg/dL    Comment: Performed at [facility], [address], [place], [place] [address]    CT RENAL STONE STUDY  Result Date: 06/29/2020 CLINICAL DATA:  Flank pain EXAM: CT ABDOMEN AND PELVIS WITHOUT CONTRAST TECHNIQUE: Multidetector CT imaging of the abdomen and pelvis was performed following the standard protocol without IV contrast. COMPARISON:  Ultrasound from 06/27/2020, CT from 06/16/2020 FINDINGS: Lower chest: Persistent but mildly improved infiltrate in the right upper lobe. Some stable subpleural nodules are noted on the right as well. No new focal infiltrate is seen. Hepatobiliary: No focal liver abnormality is seen. Status post cholecystectomy. No biliary dilatation. Pancreas: Unremarkable. No pancreatic ductal dilatation or surrounding inflammatory changes. Spleen: Normal in size without focal abnormality. Adrenals/Urinary Tract: Adrenal glands are within normal limits. Fullness of the renal collecting system on the right is seen increased when compared with the prior exam with evidence of migration of multiple calculi into the proximal right ureter. The largest of these measures approximately 6-7 mm. Residual stones are noted in the lower pole of the right kidney similar to that noted on the prior exam. Renal cystic change is again identified and stable. Left kidney demonstrates no definitive calculi. The bladder is decompressed by Foley catheter. Stomach/Bowel: Appendix is within normal limits. Scattered diverticular changes noted within the colon without evidence of diverticulitis. No small bowel abnormality is noted. Hiatal hernia is noted. Vascular/Lymphatic: Aortic atherosclerosis. No enlarged abdominal or pelvic lymph nodes. Reproductive: Prostate is enlarged. Other: No abdominal wall hernia or abnormality. No abdominopelvic ascites. Musculoskeletal: Mild degenerative changes of lumbar spine are noted.  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 The largest of these measures approximately 6-7 mm. They remain lower pole calculi on the right stable from the prior exam. Mild hydronephrosis is noted on the right. Small hiatal hernia. Diverticulosis without diverticulitis. Renal cystic changes noted stable from the prior study. Electronically Signed   By: Alcide Clever M.D.   On: 06/29/2020 16:37    Review of Systems  Constitutional: Positive for fatigue.  Genitourinary: Positive for flank pain.  All other systems reviewed and are negative.  Blood pressure 137/78, pulse 93, temperature 98.3 F (36.8 C), temperature source Oral, resp. rate 20, height 5\' 6"  (1.676 m), weight 100.3 kg, SpO2 95 %. Physical Exam Vitals reviewed.  Constitutional:      Comments: Stigmata of  severe dementia. AOx1. Wife Alice at bedisde and provides all history.   HENT:     Head: Normocephalic.     Nose: Nose normal.     Mouth/Throat:     Mouth: Mucous membranes are moist.  Eyes:     Pupils: Pupils are equal, round, and reactive to light.  Cardiovascular:     Rate and Rhythm: Normal rate.     Pulses: Normal pulses.  Pulmonary:     Effort: Pulmonary effort is normal.  Abdominal:     General: Abdomen is flat.  Genitourinary:    Comments: Foley in place with non-foul urine.  Musculoskeletal:     Cervical back: Normal range of motion.  Skin:    General: Skin is warm.  Neurological:     Mental Status: He is alert.     Assessment/Plan:  1 - Right Ureteral / Renal Stones - Rec renal decompression today. Options discussed and they opt for stent. Risks, benefits, alternatives, peri-op course dsicussed. Stone to be treated later in elective setting after over infeciton.   2 -  Urinary Retention - begin finasteride as best medical therapy at his gland size. Continue foley for now.   3 -  Recent Urosepsis - Agree with current ABX.   4 - Renal Insufficieny - acute on chronic.    Sebastian Ache 06/30/2020, 8:32 AM

## 2020-06-30 NOTE — Brief Op Note (Signed)
06/24/2020 - 06/30/2020  7:53 PM  PATIENT:  William Bishop  79 y.o. male  PRE-OPERATIVE DIAGNOSIS:  Right ureteral obsruction  POST-OPERATIVE DIAGNOSIS:  Right ureteral obsruction  PROCEDURE:  Procedure(s): CYSTOSCOPY WITH RETROGRADE PYELOGRAM/URETERAL STENT PLACEMENT (Right)  SURGEON:  Surgeon(s) and Role:    * Sebastian Ache, MD - Primary  PHYSICIAN ASSISTANT:   ASSISTANTS: none   ANESTHESIA:   general  EBL:  Minimal    BLOOD ADMINISTERED:none  DRAINS: 30F foley to gravity   LOCAL MEDICATIONS USED:  NONE  SPECIMEN:  No Specimen  DISPOSITION OF SPECIMEN:  N/A  COUNTS:  YES  TOURNIQUET:  * No tourniquets in log *  DICTATION: .Other Dictation: Dictation Number [id number]  PLAN OF CARE: Admit to inpatient   PATIENT DISPOSITION:  PACU - hemodynamically stable.   Delay start of Pharmacological VTE agent (>24hrs) due to surgical blood loss or risk of bleeding: not applicable

## 2020-06-30 NOTE — Progress Notes (Signed)
PROGRESS NOTE  William Bishop MOQ:[id number] DOB: October 21, 1940   PCP: [name], MD  Patient is from: Home  DOA: 06/24/2020 LOS: 6  Chief complaints: Confusion and fever  Brief Narrative / Interim history: 79 year old male with history of severe dementia, BPH, urinary retention s/p indwelling Foley that was recently removed presenting with increased confusion and fever, and admitted for severe sepsis due to Citrobacter freundii UTI and bacteremia.  Started on cefepime on 06/24/2020.  Indwelling Foley catheter reinserted due to urinary retention.  Hospital course complicated by AKI.  Renal ultrasound with benign right renal cyst and lower pole nephrolithiasis.  Patient developed acute right flank pain.  CT renal study revealed migration of several calculi into proximal right ureter with the largest 1 measuring 6 to 7 cm, and mild right hydronephrosis.  Urology, Dr. Berneice Heinrich consulted.  Subjective: Seen and examined earlier this morning.  Per RN, patient has significant pain overnight.  Also had an episode of emesis this morning.  Emesis was nonbloody.  Patient has no complaints but not a great historian due to advanced dementia.  Objective: Vitals:   06/29/20 0522 06/29/20 1300 06/29/20 2007 06/30/20 0500  BP: (!) 145/90 (!) 145/93 (!) 148/88 137/78  Pulse: 69 72 80 93  Resp: 18 18 18 20   Temp: 97.7 F (36.5 C) 97.6 F (36.4 C) 99.1 F (37.3 C) 98.3 F (36.8 C)  TempSrc:   Oral Oral  SpO2: 99% 96% 94% 95%  Weight:      Height:        Intake/Output Summary (Last 24 hours) at 06/30/2020 1057 Last data filed at 06/30/2020 0500 Gross per 24 hour  Intake 2541.34 ml  Output 1050 ml  Net 1491.34 ml   Filed Weights   06/24/20 1826 06/25/20 0116  Weight: 95.3 kg 100.3 kg    Examination: GENERAL: No apparent distress.  Nontoxic. HEENT: MMM.  Vision and hearing grossly intact.  NECK: Supple.  No apparent JVD.  RESP:  No IWOB.  Fair aeration bilaterally. CVS:  RRR. Heart  sounds normal.  ABD/GI/GU: BS+. Abd soft, NTND.  MSK/EXT:  Moves extremities. No apparent deformity. No edema.  SKIN: no apparent skin lesion or wound NEURO: Awake and alert.  Only oriented to self.  No apparent focal neuro deficit. PSYCH: Calm. Normal affect.  Procedures:  None  Microbiology summarized: 11/17-COVID-19, influenza and RSV PCR nonreactive. 11/17-urine culture and blood cultures with Citrobacter freundii   Assessment & Plan: Severe sepsis due to Citrobacter freundii bacteremia and UTI: POA.  Both urine and blood cultures grew Citrobacter freundii sensitive to cefepime.  Sepsis physiology resolved. -Continue IV cefepime 11/17>>>  Right ureteral stone: Patient developed new right flank pain.  CT renal study revealed migration of renal stone into right proximal ureter.  -Discussed with urology, Dr. 06/27/20 who will see patient this afternoon. -Started Flomax.  As needed Dilaudid for pain. -N.p.o.  Catheter associated Citrobacter freundii UTI-Foley removed prior to admit but reinserted due to urinary retention. -Antibiotics as above. -Continue indwelling Foley catheter on discharge.  BPH/urinary retention: Foley catheter reinserted -Continue Flomax -Continue indwelling Foley catheter on discharge. -Voiding trial at urology office at follow-up.  Toxic metabolic encephalopathy likely due to severe sepsis and underlying dementia: Mental status seems to be at baseline now. -Treat sepsis as above -Reorientation and delirium precautions  Constipation: Resolved. -Bowel regimen as needed  AKI: Baseline Cr 1.2-1.3> 1.35 (admit)>> 1.12>> 1.83> 1.65.  BUN 22> 20.  Likely due to sepsis and obstructive uropathy.  Excellent urine output. -Continue indwelling Foley catheter -Continue IV NS at 100 cc an hour -Avoid nephrotoxic meds -Recheck bmet in AM  Hypokalemia: Resolved.  Thrombocytopenia: Platelet 119.  Improving.  Body mass index is 35.69 kg/m.         DVT  prophylaxis:  enoxaparin (LOVENOX) injection 40 mg Start: 06/25/20 0600  Code Status: DNR/DNI Family Communication: Updated patient's wife at bedside. Status is: Inpatient  Remains inpatient appropriate because:IV treatments appropriate due to intensity of illness or inability to take PO   Dispo: The patient is from: Home              Anticipated d/c is to: Home              Anticipated d/c date is: 2 days              Patient currently is not medically stable to d/c.       Consultants:  Infectious disease over the phone Urology   Sch Meds:  Scheduled Meds: . Chlorhexidine Gluconate Cloth  6 each Topical Daily  . enoxaparin (LOVENOX) injection  40 mg Subcutaneous Q24H  . haloperidol lactate  2 mg Intravenous Once  . tamsulosin  0.4 mg Oral QPC supper   Continuous Infusions: . sodium chloride 100 mL/hr at 06/30/20 0331  . ceFEPime (MAXIPIME) IV 2 g (06/30/20 0003)   PRN Meds:.acetaminophen **OR** acetaminophen, HYDROmorphone (DILAUDID) injection, ondansetron **OR** ondansetron (ZOFRAN) IV  Antimicrobials: Anti-infectives (From admission, onward)   Start     Dose/Rate Route Frequency Ordered Stop   06/27/20 2359  ceFEPIme (MAXIPIME) 2 g in sodium chloride 0.9 % 100 mL IVPB        2 g 200 mL/hr over 30 Minutes Intravenous Every 12 hours 06/27/20 1328     06/25/20 1700  ceFEPIme (MAXIPIME) 2 g in sodium chloride 0.9 % 100 mL IVPB  Status:  Discontinued        2 g 200 mL/hr over 30 Minutes Intravenous Every 8 hours 06/25/20 1522 06/27/20 1328   06/25/20 0930  ceFEPIme (MAXIPIME) 2 g in sodium chloride 0.9 % 100 mL IVPB  Status:  Discontinued        2 g 200 mL/hr over 30 Minutes Intravenous Every 8 hours 06/25/20 0854 06/25/20 1522   06/25/20 0114  cefTRIAXone (ROCEPHIN) 1 g in sodium chloride 0.9 % 100 mL IVPB  Status:  Discontinued        1 g 200 mL/hr over 30 Minutes Intravenous Every 24 hours 06/25/20 0114 06/25/20 0854   06/24/20 1815  ceFEPIme (MAXIPIME) 2 g in  sodium chloride 0.9 % 100 mL IVPB        2 g 200 mL/hr over 30 Minutes Intravenous  Once 06/24/20 1813 06/24/20 1912       I have personally reviewed the following labs and images: CBC: Recent Labs  Lab 06/24/20 1807 06/25/20 0544 06/26/20 0637 06/27/20 0608 06/28/20 0550  WBC 8.5 18.6* 10.9* 8.2 7.3  NEUTROABS 7.8*  --   --   --   --   HGB 14.6 13.4 13.6 12.9* 13.4  HCT 41.5 37.3* 38.3* 36.2* 38.2*  MCV 93.9 93.3 93.2 93.1 92.7  PLT 174 128* 105* 104* 119*   BMP &GFR Recent Labs  Lab 06/26/20 0637 06/27/20 0608 06/28/20 0550 06/29/20 0540 06/30/20 0543  NA 138 140 141 137 138  K 3.2* 3.2* 3.7 3.6 3.7  CL 106 108 109 106 109  CO2 21* 23 24 22  20*  GLUCOSE 102* 86 89 91 88  BUN 23 24* 22 21 20   CREATININE 1.52* 1.65* 1.83* 1.82* 1.65*  CALCIUM 8.7* 8.3* 8.5* 8.4* 8.4*  MG  --  2.0  --  2.1 2.1  PHOS  --   --   --  2.3* 2.6   Estimated Creatinine Clearance: 40.3 mL/min (A) (by C-G formula based on SCr of 1.65 mg/dL (H)). Liver & Pancreas: Recent Labs  Lab 06/24/20 1807 06/24/20 1807 06/25/20 0544 06/25/20 0544 06/26/20 0637 06/27/20 0608 06/28/20 0550 06/29/20 0540 06/30/20 0543  AST 52*  --  102*  --  76* 43* 33  --   --   ALT 37  --  65*  --  47* 33 28  --   --   ALKPHOS 117  --  98  --  85 69 68  --   --   BILITOT 1.7*  --  1.7*  --  1.7* 1.0 1.1  --   --   PROT 7.1  --  6.0*  --  5.8* 5.2* 5.7*  --   --   ALBUMIN 4.2   < > 3.3*   < > 3.2* 2.8* 2.9* 3.0* 3.1*   < > = values in this interval not displayed.   No results for input(s): LIPASE, AMYLASE in the last 168 hours. No results for input(s): AMMONIA in the last 168 hours. Diabetic: No results for input(s): HGBA1C in the last 72 hours. Recent Labs  Lab 06/27/20 2012 06/28/20 1455  GLUCAP 90 90   Cardiac Enzymes: No results for input(s): CKTOTAL, CKMB, CKMBINDEX, TROPONINI in the last 168 hours. No results for input(s): PROBNP in the last 8760 hours. Coagulation Profile: Recent Labs  Lab  06/24/20 1807 06/25/20 0544  INR 1.1 1.3*   Thyroid Function Tests: No results for input(s): TSH, T4TOTAL, FREET4, T3FREE, THYROIDAB in the last 72 hours. Lipid Profile: No results for input(s): CHOL, HDL, LDLCALC, TRIG, CHOLHDL, LDLDIRECT in the last 72 hours. Anemia Panel: No results for input(s): VITAMINB12, FOLATE, FERRITIN, TIBC, IRON, RETICCTPCT in the last 72 hours. Urine analysis:    Component Value Date/Time   COLORURINE YELLOW 06/24/2020 1828   APPEARANCEUR HAZY (A) 06/24/2020 1828   LABSPEC 1.013 06/24/2020 1828   PHURINE 5.0 06/24/2020 1828   GLUCOSEU NEGATIVE 06/24/2020 1828   HGBUR MODERATE (A) 06/24/2020 1828   BILIRUBINUR NEGATIVE 06/24/2020 1828   KETONESUR NEGATIVE 06/24/2020 1828   PROTEINUR NEGATIVE 06/24/2020 1828   NITRITE NEGATIVE 06/24/2020 1828   LEUKOCYTESUR LARGE (A) 06/24/2020 1828   Sepsis Labs: Invalid input(s): PROCALCITONIN, LACTICIDVEN  Microbiology: Recent Results (from the past 240 hour(s))  Blood Culture (routine x 2)     Status: Abnormal   Collection Time: 06/24/20  6:07 PM   Specimen: BLOOD  Result Value Ref Range Status   Specimen Description   Final    BLOOD LEFT ANTECUBITAL Performed at [facility], [address], [place], [place]    Special Requests   Final    BOTTLES DRAWN AEROBIC AND ANAEROBIC Blood Culture adequate volume Performed at [facility], [address] [place], [place]    Culture  Setup Time   Final    GRAM NEGATIVE RODS IN BOTH AEROBIC AND ANAEROBIC BOTTLES CRITICAL VALUE NOTED.  VALUE IS CONSISTENT WITH PREVIOUSLY REPORTED AND CALLED VALUE.    Culture (A)  Final    CITROBACTER FREUNDII SUSCEPTIBILITIES PERFORMED ON PREVIOUS CULTURE WITHIN THE LAST 5 DAYS. Performed at  [facility], [address] [place], [place] [address]    Report Status 06/27/2020 FINAL  Final  Blood Culture (routine x 2)     Status: Abnormal   Collection Time:  06/24/20  6:12 PM   Specimen: BLOOD  Result Value Ref Range Status   Specimen Description   Final    BLOOD RIGHT ANTECUBITAL Performed at [facility], [address] [place], [place] [address]    Special Requests   Final    BOTTLES DRAWN AEROBIC AND ANAEROBIC Blood Culture adequate volume Performed at [facility], [address] [place], [place] [address]    Culture  Setup Time   Final    IN BOTH AEROBIC AND ANAEROBIC BOTTLES GRAM NEGATIVE RODS CRITICAL RESULT CALLED TO, READ BACK BY AND VERIFIED WITH: Kathrynn SpeedHARMD M MICHAELS [id number] FCP Performed at [facility], [address] [place], [place] [address]    Culture CITROBACTER FREUNDII (A)  Final   Report Status 06/27/2020 FINAL  Final   Organism ID, Bacteria CITROBACTER FREUNDII  Final      Susceptibility   Citrobacter freundii - MIC*    CEFAZOLIN >=64 RESISTANT Resistant     CEFEPIME <=0.12 SENSITIVE Sensitive     CEFTAZIDIME <=1 SENSITIVE Sensitive     CEFTRIAXONE 8 RESISTANT Resistant     CIPROFLOXACIN <=0.25 SENSITIVE Sensitive     GENTAMICIN >=16 RESISTANT Resistant     IMIPENEM 1 SENSITIVE Sensitive     TRIMETH/SULFA >=320 RESISTANT Resistant     PIP/TAZO <=4 SENSITIVE Sensitive     * CITROBACTER FREUNDII  Blood Culture ID Panel (Reflexed)     Status: Abnormal   Collection Time: 06/24/20  6:12 PM  Result Value Ref Range Status   Enterococcus faecalis NOT DETECTED NOT DETECTED Final   Enterococcus Faecium NOT DETECTED NOT DETECTED Final   Listeria monocytogenes NOT DETECTED NOT DETECTED Final   Staphylococcus species NOT DETECTED NOT DETECTED Final   Staphylococcus aureus (BCID) NOT DETECTED NOT DETECTED Final   Staphylococcus epidermidis NOT DETECTED NOT DETECTED Final   Staphylococcus lugdunensis NOT DETECTED NOT DETECTED Final   Streptococcus species NOT DETECTED NOT DETECTED Final   Streptococcus agalactiae NOT DETECTED NOT DETECTED Final   Streptococcus  pneumoniae NOT DETECTED NOT DETECTED Final   Streptococcus pyogenes NOT DETECTED NOT DETECTED Final   A.calcoaceticus-baumannii NOT DETECTED NOT DETECTED Final   Bacteroides fragilis NOT DETECTED NOT DETECTED Final   Enterobacterales DETECTED (A) NOT DETECTED Final    Comment: Enterobacterales represent a large order of gram negative bacteria, not a single organism. Refer to culture for further identification. CRITICAL RESULT CALLED TO, READ BACK BY AND VERIFIED WITH: PHARMD M MICHAELS 0840 [id number] FCP    Enterobacter cloacae complex NOT DETECTED NOT DETECTED Final   Escherichia coli NOT DETECTED NOT DETECTED Final   Klebsiella aerogenes NOT DETECTED NOT DETECTED Final   Klebsiella oxytoca NOT DETECTED NOT DETECTED Final   Klebsiella pneumoniae NOT DETECTED NOT DETECTED Final   Proteus species NOT DETECTED NOT DETECTED Final   Salmonella species NOT DETECTED NOT DETECTED Final   Serratia marcescens NOT DETECTED NOT DETECTED Final   Haemophilus influenzae NOT DETECTED NOT DETECTED Final   Neisseria meningitidis NOT DETECTED NOT DETECTED Final   Pseudomonas aeruginosa NOT DETECTED NOT DETECTED Final   Stenotrophomonas maltophilia NOT DETECTED NOT DETECTED Final   Candida albicans NOT DETECTED NOT DETECTED Final   Candida auris NOT DETECTED NOT DETECTED Final   Candida glabrata  NOT DETECTED NOT DETECTED Final   Candida krusei NOT DETECTED NOT DETECTED Final   Candida parapsilosis NOT DETECTED NOT DETECTED Final   Candida tropicalis NOT DETECTED NOT DETECTED Final   Cryptococcus neoformans/gattii NOT DETECTED NOT DETECTED Final   CTX-M ESBL NOT DETECTED NOT DETECTED Final   Carbapenem resistance IMP NOT DETECTED NOT DETECTED Final   Carbapenem resistance KPC NOT DETECTED NOT DETECTED Final   Carbapenem resistance NDM NOT DETECTED NOT DETECTED Final   Carbapenem resist OXA 48 LIKE NOT DETECTED NOT DETECTED Final   Carbapenem resistance VIM NOT DETECTED NOT DETECTED Final    Comment:  Performed at [facility], [address] [place], [place] [address]  Respiratory Panel by RT PCR (Flu A&B, Covid) - Nasopharyngeal Swab     Status: None   Collection Time: 06/24/20  6:26 PM   Specimen: Nasopharyngeal Swab  Result Value Ref Range Status   SARS Coronavirus 2 by RT PCR NEGATIVE NEGATIVE Final    Comment: (NOTE) SARS-CoV-2 target nucleic acids are NOT DETECTED.  The SARS-CoV-2 RNA is generally detectable in upper respiratoy specimens during the acute phase of infection. The lowest concentration of SARS-CoV-2 viral copies this assay can detect is 131 copies/mL. A negative result does not preclude SARS-Cov-2 infection and should not be used as the sole basis for treatment or other patient management decisions. A negative result may occur with  improper specimen collection/handling, submission of specimen other than nasopharyngeal swab, presence of viral mutation(s) within the areas targeted by this assay, and inadequate number of viral copies (<131 copies/mL). A negative result must be combined with clinical observations, patient history, and epidemiological information. The expected result is Negative.  Fact Sheet for Patients:  https://www.moore.com/  Fact Sheet for Healthcare Providers:  https://www.young.biz/  This test is no t yet approved or cleared by the [place] FDA and  has been authorized for detection and/or diagnosis of SARS-CoV-2 by FDA under an Emergency Use Authorization (EUA). This EUA will remain  in effect (meaning this test can be used) for the duration of the COVID-19 declaration under Section 564(b)(1) of the Act, 21 U.S.C. section 360bbb-3(b)(1), unless the authorization is terminated or revoked sooner.     Influenza A by PCR NEGATIVE NEGATIVE Final   Influenza B by PCR NEGATIVE NEGATIVE Final    Comment: (NOTE) The Xpert Xpress SARS-CoV-2/FLU/RSV assay is intended as an aid in  the diagnosis  of influenza from Nasopharyngeal swab specimens and  should not be used as a sole basis for treatment. Nasal washings and  aspirates are unacceptable for Xpert Xpress SARS-CoV-2/FLU/RSV  testing.  Fact Sheet for Patients: https://www.moore.com/  Fact Sheet for Healthcare Providers: https://www.young.biz/  This test is not yet approved or cleared by the [place] FDA and  has been authorized for detection and/or diagnosis of SARS-CoV-2 by  FDA under an Emergency Use Authorization (EUA). This EUA will remain  in effect (meaning this test can be used) for the duration of the  Covid-19 declaration under Section 564(b)(1) of the Act, 21  U.S.C. section 360bbb-3(b)(1), unless the authorization is  terminated or revoked. Performed at [facility], [address], [place], [place] [address]   Urine culture     Status: Abnormal   Collection Time: 06/24/20  6:28 PM   Specimen: In/Out Cath Urine  Result Value Ref Range Status   Specimen Description   Final    IN/OUT CATH URINE Performed at [facility], [address] [place], [place] [address]    Special Requests   Final    NONE Performed at [facility], [address], [place], [place] [address]    Culture 70,000 COLONIES/mL CITROBACTER FREUNDII (A)  Final   Report Status 06/26/2020 FINAL  Final   Organism ID, Bacteria CITROBACTER FREUNDII (A)  Final      Susceptibility   Citrobacter freundii - MIC*    CEFAZOLIN >=64 RESISTANT Resistant     CEFEPIME <=0.12 SENSITIVE Sensitive     CEFTRIAXONE <=0.25 SENSITIVE Sensitive     CIPROFLOXACIN <=0.25 SENSITIVE Sensitive     GENTAMICIN >=16 RESISTANT Resistant     IMIPENEM 1 SENSITIVE Sensitive     NITROFURANTOIN <=16 SENSITIVE Sensitive     TRIMETH/SULFA <=20 SENSITIVE Sensitive     PIP/TAZO <=4 SENSITIVE Sensitive     * 70,000 COLONIES/mL CITROBACTER FREUNDII    Radiology  Studies: CT RENAL STONE STUDY  Result Date: 06/29/2020 CLINICAL DATA:  Flank pain EXAM: CT ABDOMEN AND PELVIS WITHOUT CONTRAST TECHNIQUE: Multidetector CT imaging of the abdomen and pelvis was performed following the standard protocol without IV contrast. COMPARISON:  Ultrasound from 06/27/2020, CT from 06/16/2020 FINDINGS: Lower chest: Persistent but mildly improved infiltrate in the right upper lobe. Some stable subpleural nodules are noted on the right as well. No new focal infiltrate is seen. Hepatobiliary: No focal liver abnormality is seen. Status post cholecystectomy. No biliary dilatation. Pancreas: Unremarkable. No pancreatic ductal dilatation or surrounding inflammatory changes. Spleen: Normal in size without focal abnormality. Adrenals/Urinary Tract: Adrenal glands are within normal limits. Fullness of the renal collecting system on the right is seen increased when compared with the prior exam with evidence of migration of multiple calculi into the proximal right ureter. The largest of these measures approximately 6-7 mm. Residual stones are noted in the lower pole of the right kidney similar to that noted on the prior exam. Renal cystic change is again identified and stable. Left kidney demonstrates no definitive calculi. The bladder is decompressed by Foley catheter. Stomach/Bowel: Appendix is within normal limits. Scattered diverticular changes noted within the colon without evidence of diverticulitis. No small bowel abnormality is noted. Hiatal hernia is noted. Vascular/Lymphatic: Aortic atherosclerosis. No enlarged abdominal or pelvic lymph nodes. Reproductive: Prostate is enlarged. Other: No abdominal wall hernia or abnormality. No abdominopelvic ascites. Musculoskeletal: Mild degenerative changes of lumbar spine are noted. 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 The largest of these measures approximately 6-7 mm. They remain lower pole calculi on the right  stable from the prior exam. Mild hydronephrosis is noted on the right. Small hiatal hernia. Diverticulosis without diverticulitis. Renal cystic changes noted stable from the prior study. Electronically Signed   By: Alcide Clever M.D.   On: 06/29/2020 16:37      Jamont Mellin T[facility] Rayann [facility]ol[facility]  If 7PM-7AM, please contact night-coverage [email or web address] 06/30/2020, 10:57 AM

## 2020-06-30 NOTE — Anesthesia Preprocedure Evaluation (Signed)
Anesthesia Evaluation  Patient identified by MRN, date of birth, ID band Patient awake    Reviewed: Allergy & Precautions, NPO status , Patient's Chart, lab work & pertinent test results  History of Anesthesia Complications Negative for: history of anesthetic complications  Airway Mallampati: II  TM Distance: >3 FB Neck ROM: Full    Dental  (+) Missing Metal implants for dentures:   Pulmonary neg pulmonary ROS,    Pulmonary exam normal        Cardiovascular negative cardio ROS Normal cardiovascular exam     Neuro/Psych Dementia Altered mental status on admission; now back to baseline severe dementia    GI/Hepatic negative GI ROS, Neg liver ROS,   Endo/Other  negative endocrine ROS  Renal/GU Renal disease (R ureteral obstruction; Cr 1.65)  negative genitourinary   Musculoskeletal negative musculoskeletal ROS (+)   Abdominal   Peds  Hematology negative hematology ROS (+)   Anesthesia Other Findings Admitted with severe sepsis due to Citrobacter freundii bacteremia and UTI, currently treated with IV cefepime  Reproductive/Obstetrics                             Anesthesia Physical Anesthesia Plan  ASA: III  Anesthesia Plan: General   Post-op Pain Management:    Induction: Intravenous  PONV Risk Score and Plan: 2 and Ondansetron, Dexamethasone, Midazolam and Treatment may vary due to age or medical condition  Airway Management Planned: LMA  Additional Equipment: None  Intra-op Plan:   Post-operative Plan: Extubation in OR  Informed Consent: I have reviewed the patients History and Physical, chart, labs and discussed the procedure including the risks, benefits and alternatives for the proposed anesthesia with the patient or authorized representative who has indicated his/her understanding and acceptance.   Patient has DNR.  Discussed DNR with power of attorney and Suspend DNR.    Consent reviewed with POA  Plan Discussed with:   Anesthesia Plan Comments:         Anesthesia Quick Evaluation

## 2020-06-30 NOTE — Anesthesia Procedure Notes (Signed)
Procedure Name: LMA Insertion Date/Time: 06/30/2020 7:32 PM Performed by: Yolonda Kida, CRNA Pre-anesthesia Checklist: Patient identified, Emergency Drugs available, Suction available and Patient being monitored Patient Re-evaluated:Patient Re-evaluated prior to induction Oxygen Delivery Method: Circle system utilized Preoxygenation: Pre-oxygenation with 100% oxygen Induction Type: IV induction LMA: LMA inserted LMA Size: 4.0 Number of attempts: 1 Placement Confirmation: positive ETCO2 and breath sounds checked- equal and bilateral Tube secured with: Tape Dental Injury: Teeth and Oropharynx as per pre-operative assessment

## 2020-06-30 NOTE — Transfer of Care (Signed)
Immediate Anesthesia Transfer of Care Note  Patient: William Bishop  Procedure(s) Performed: CYSTOSCOPY WITH RETROGRADE PYELOGRAM/URETERAL STENT PLACEMENT (Right )  Patient Location: PACU  Anesthesia Type:General  Level of Consciousness: sedated and patient cooperative  Airway & Oxygen Therapy: Patient Spontanous Breathing and Patient connected to face mask oxygen  Post-op Assessment: Report given to RN, Post -op Vital signs reviewed and stable and Patient moving all extremities X 4  Post vital signs: stable  Last Vitals:  Vitals Value Taken Time  BP    Temp    Pulse    Resp    SpO2      Last Pain:  Vitals:   06/30/20 1249  TempSrc: Oral  PainSc:       Patients Stated Pain Goal: 4 (06/30/20 0506)  Complications: No complications documented.

## 2020-07-01 ENCOUNTER — Encounter (HOSPITAL_COMMUNITY): Payer: Self-pay | Admitting: Urology

## 2020-07-01 DIAGNOSIS — A419 Sepsis, unspecified organism: Secondary | ICD-10-CM | POA: Diagnosis not present

## 2020-07-01 DIAGNOSIS — N390 Urinary tract infection, site not specified: Secondary | ICD-10-CM | POA: Diagnosis not present

## 2020-07-01 LAB — RENAL FUNCTION PANEL
Albumin: 3.2 g/dL — ABNORMAL LOW (ref 3.5–5.0)
Anion gap: 10 (ref 5–15)
BUN: 17 mg/dL (ref 8–23)
CO2: 21 mmol/L — ABNORMAL LOW (ref 22–32)
Calcium: 8.7 mg/dL — ABNORMAL LOW (ref 8.9–10.3)
Chloride: 106 mmol/L (ref 98–111)
Creatinine, Ser: 1.03 mg/dL (ref 0.61–1.24)
GFR, Estimated: >60 mL/min (ref >60)
Glucose, Bld: 103 mg/dL — ABNORMAL HIGH (ref 70–99)
Phosphorus: 2 mg/dL — ABNORMAL LOW (ref 2.5–4.6)
Potassium: 4.1 mmol/L (ref 3.5–5.1)
Sodium: 137 mmol/L (ref 135–145)

## 2020-07-01 LAB — CBC WITH DIFFERENTIAL/PLATELET
Abs Immature Granulocytes: 0.21 10*3/uL — ABNORMAL HIGH (ref 0.00–0.07)
Basophils Absolute: 0 10*3/uL (ref 0.0–0.1)
Basophils Relative: 0 %
Eosinophils Absolute: 0 10*3/uL (ref 0.0–0.5)
Eosinophils Relative: 0 %
HCT: 40.7 % (ref 39.0–52.0)
Hemoglobin: 14.3 g/dL (ref 13.0–17.0)
Immature Granulocytes: 2 %
Lymphocytes Relative: 13 %
Lymphs Abs: 1.5 10*3/uL (ref 0.7–4.0)
MCH: 32.8 pg (ref 26.0–34.0)
MCHC: 35.1 g/dL (ref 30.0–36.0)
MCV: 93.3 fL (ref 80.0–100.0)
Monocytes Absolute: 0.5 10*3/uL (ref 0.1–1.0)
Monocytes Relative: 4 %
Neutro Abs: 8.9 10*3/uL — ABNORMAL HIGH (ref 1.7–7.7)
Neutrophils Relative %: 81 %
Platelets: 152 10*3/uL (ref 150–400)
RBC: 4.36 MIL/uL (ref 4.22–5.81)
RDW: 13.3 % (ref 11.5–15.5)
WBC: 11.1 10*3/uL — ABNORMAL HIGH (ref 4.0–10.5)
nRBC: 0 % (ref 0.0–0.2)

## 2020-07-01 LAB — MAGNESIUM: Magnesium: 2.1 mg/dL (ref 1.7–2.4)

## 2020-07-01 LAB — GLUCOSE, CAPILLARY
Glucose-Capillary: 84 mg/dL (ref 70–99)
Glucose-Capillary: 102 mg/dL — ABNORMAL HIGH (ref 70–99)

## 2020-07-01 MED ORDER — QUETIAPINE FUMARATE 25 MG PO TABS
50.0000 mg | ORAL_TABLET | Freq: Every day | ORAL | Status: DC
  Administered 2020-07-01 – 2020-07-06 (×5): 50 mg via ORAL
  Filled 2020-07-01 (×7): qty 2

## 2020-07-01 MED ORDER — CHLORPROMAZINE HCL 10 MG PO TABS
10.0000 mg | ORAL_TABLET | Freq: Four times a day (QID) | ORAL | Status: DC | PRN
  Administered 2020-07-02 – 2020-07-05 (×2): 10 mg via ORAL
  Filled 2020-07-01 (×6): qty 1

## 2020-07-01 MED ORDER — LORAZEPAM 2 MG/ML IJ SOLN
1.0000 mg | Freq: Once | INTRAMUSCULAR | Status: AC
  Administered 2020-07-01: 1 mg via INTRAVENOUS
  Filled 2020-07-01: qty 1

## 2020-07-01 NOTE — Progress Notes (Signed)
2140:  Pt having red drainage from around catheter at meatus, not much drainage in foley drainage bag.  Alysen Demzik notified via phone.  Order received to irrigate foley, allow to drain, then bladder scan.  2200:  Irrigated foley with 50 ml sterile NS.  No fluid returned.  Pt stated that he needed to urinated and started bearing down.  Red fluid expelled from around catheter again at meatus.  Pt was bladder scanned and stated that he had 143 ml in his bladder.    2206  Alysen Demzik notified and new order received to insert a new 18 fr foley catheter.

## 2020-07-01 NOTE — Progress Notes (Signed)
PROGRESS NOTE    William Bishop  VOH:[id number] DOB: 1941-05-29 DOA: 06/24/2020 PCP: [name], MD    Chief Complaint  Patient presents with  . Code Sepsis  . Altered Mental Status    Brief Narrative:  79 year old male with history of severe dementia, BPH, urinary retention s/p indwelling Foley that was recently removed presenting with increased confusion and fever, and admitted for severe sepsis due to Citrobacter freundii UTI and bacteremia.  Started on cefepime on 06/24/2020.  Indwelling Foley catheter reinserted due to urinary retention.  Subjective:  Patient has dementia, not able to provide history He has not been eating due to not wanting denture in per RN during rounds He has been having hiccups whole morning per RN during rounds RN reports patient has blood around his catheter Urine still dark in the bag Wife at bedside   Assessment & Plan:   Principal Problem:   Sepsis secondary to UTI [facility]) Active Problems:   Acute lower UTI   Sepsis (HCC)   Sepsis due to Citrobacter bacteremia and UTI present on admission Right ureteral stone status post stent placement on November 23 Continue IV antibiotic cefepime, repeat blood culture  Catheter associated UTI -Foley removed prior to admission but reinserted due to urinary retention/BPH -Plan to discharge home with Foley catheter and close follow-up with urology -Will hold lovenox, and scd's due to blood noticed around catheter, monitor H&H -Continue Flomax  AKI on CKD 2 -Appear resolving, continue monitor renal function -DC IV hydration  Hypertension Home blood pressure Imdur held due to sepsis, likely able to resume tomorrow  Dementia With acute metabolic encephalopathy on presentation, wife state mental status has improved Resume nightly Seroquel  DVT prophylaxis: enoxaparin (LOVENOX) injection 40 mg Start: 06/25/20 0600   Code Status: DNR Family Communication: Wife Disposition:   Status is:  Inpatient   Dispo: The patient is from: Home              Anticipated d/c is to: Home              Anticipated d/c date is: 24 to 48 hours if no bleeding associated with catheter, hemoglobin stable, repeat blood culture no growth                Consultants:   Urology  Procedures:   Right ureteral stent placement  Antimicrobials:   As above     Objective: Vitals:   06/30/20 2143 07/01/20 0606 07/01/20 1201 07/01/20 1202  BP:  (!) 149/91 (!) 148/85 (!) 148/85  Pulse:  84 86 88  Resp:  20 15 15   Temp: 98.4 F (36.9 C) 98 F (36.7 C) 98.3 F (36.8 C) 98.3 F (36.8 C)  TempSrc: Axillary     SpO2:   98% 97%  Weight:      Height:        Intake/Output Summary (Last 24 hours) at 07/01/2020 1328 Last data filed at 07/01/2020 0900 Gross per 24 hour  Intake 2483.76 ml  Output 1700 ml  Net 783.76 ml   Filed Weights   06/24/20 1826 06/25/20 0116 06/30/20 1805  Weight: 95.3 kg 100.3 kg 100.3 kg    Examination:  General exam: Demented but calm, NAD, Foley in place Respiratory system: Clear to auscultation. Respiratory effort normal. Cardiovascular system: S1 & S2 heard, RRR. No JVD, no murmur, No pedal edema. Gastrointestinal system: Abdomen is nondistended, soft and nontender. Normal bowel sounds heard. Central nervous system: Alert and oriented to self. No focal  neurological deficits. Extremities: Symmetric 5 x 5 power. Skin: No rashes, lesions or ulcers Psychiatry: Demented    Data Reviewed: I have personally reviewed following labs and imaging studies  CBC: Recent Labs  Lab 06/24/20 1807 06/24/20 1807 06/25/20 0544 06/26/20 0637 06/27/20 0608 06/28/20 0550 07/01/20 0923  WBC 8.5   < > 18.6* 10.9* 8.2 7.3 11.1*  NEUTROABS 7.8*  --   --   --   --   --  8.9*  HGB 14.6   < > 13.4 13.6 12.9* 13.4 14.3  HCT 41.5   < > 37.3* 38.3* 36.2* 38.2* 40.7  MCV 93.9   < > 93.3 93.2 93.1 92.7 93.3  PLT 174   < > 128* 105* 104* 119* 152   < > = values in this  interval not displayed.    Basic Metabolic Panel: Recent Labs  Lab 06/27/20 0608 06/28/20 0550 06/29/20 0540 06/30/20 0543 07/01/20 0923  NA 140 141 137 138 137  K 3.2* 3.7 3.6 3.7 4.1  CL 108 109 106 109 106  CO2 20* 21*  GLUCOSE 86 89 91 88 103*  BUN 24* CREATININE 1.65* 1.83* 1.82* 1.65* 1.03  CALCIUM 8.3* 8.5* 8.4* 8.4* 8.7*  MG 2.0  --  2.1 2.1 2.1  PHOS  --   --  2.3* 2.6 2.0*    GFR: Estimated Creatinine Clearance: 64.5 mL/min (by C-G formula based on SCr of 1.03 mg/dL).  Liver Function Tests: Recent Labs  Lab 06/24/20 1807 06/24/20 1807 06/25/20 0544 06/25/20 0544 06/26/20 0637 06/26/20 0637 06/27/20 0608 06/28/20 0550 06/29/20 0540 06/30/20 0543 07/01/20 0923  AST 52*  --  102*  --  76*  --  43* 33  --   --   --   ALT 37  --  65*  --  47*  --  33 28  --   --   --   ALKPHOS 117  --  98  --  85  --  69 68  --   --   --   BILITOT 1.7*  --  1.7*  --  1.7*  --  1.0 1.1  --   --   --   PROT 7.1  --  6.0*  --  5.8*  --  5.2* 5.7*  --   --   --   ALBUMIN 4.2   < > 3.3*   < > 3.2*   < > 2.8* 2.9* 3.0* 3.1* 3.2*   < > = values in this interval not displayed.    CBG: Recent Labs  Lab 06/27/20 2012 06/28/20 1455 07/01/20 1155  GLUCAP 90 90 84     Recent Results (from the past 240 hour(s))  Blood Culture (routine x 2)     Status: Abnormal   Collection Time: 06/24/20  6:07 PM   Specimen: BLOOD  Result Value Ref Range Status   Specimen Description   Final    BLOOD LEFT ANTECUBITAL Performed at [facility], [address] [place], [place] [address]    Special Requests   Final    BOTTLES DRAWN AEROBIC AND ANAEROBIC Blood Culture adequate volume Performed at [facility], [address] [place], [place] [address]    Culture  Setup Time   Final    GRAM NEGATIVE RODS IN BOTH AEROBIC AND ANAEROBIC BOTTLES CRITICAL VALUE NOTED.  VALUE IS CONSISTENT WITH PREVIOUSLY REPORTED AND CALLED VALUE.  Culture (A)  Final    CITROBACTER FREUNDII SUSCEPTIBILITIES PERFORMED ON PREVIOUS CULTURE WITHIN THE LAST 5 DAYS. Performed at [facility], [address], [place], [place] [address]    Report Status 06/27/2020 FINAL  Final  Blood Culture (routine x 2)     Status: Abnormal   Collection Time: 06/24/20  6:12 PM   Specimen: BLOOD  Result Value Ref Range Status   Specimen Description   Final    BLOOD RIGHT ANTECUBITAL Performed at [facility], [address], [place], [place] [address]    Special Requests   Final    BOTTLES DRAWN AEROBIC AND ANAEROBIC Blood Culture adequate volume Performed at [facility], [address] [place], [place] [address]    Culture  Setup Time   Final    IN BOTH AEROBIC AND ANAEROBIC BOTTLES GRAM NEGATIVE RODS CRITICAL RESULT CALLED TO, READ BACK BY AND VERIFIED WITH: Kathrynn Speed [phone] FCP Performed at [facility], [address] [place], [place] [address]    Culture CITROBACTER FREUNDII (A)  Final   Report Status 06/27/2020 FINAL  Final   Organism ID, Bacteria CITROBACTER FREUNDII  Final      Susceptibility   Citrobacter freundii - MIC*    CEFAZOLIN >=64 RESISTANT Resistant     CEFEPIME <=0.12 SENSITIVE Sensitive     CEFTAZIDIME <=1 SENSITIVE Sensitive     CEFTRIAXONE 8 RESISTANT Resistant     CIPROFLOXACIN <=0.25 SENSITIVE Sensitive     GENTAMICIN >=16 RESISTANT Resistant     IMIPENEM 1 SENSITIVE Sensitive     TRIMETH/SULFA >=320 RESISTANT Resistant     PIP/TAZO <=4 SENSITIVE Sensitive     * CITROBACTER FREUNDII  Blood Culture ID Panel (Reflexed)     Status: Abnormal   Collection Time: 06/24/20  6:12 PM  Result Value Ref Range Status   Enterococcus faecalis NOT DETECTED NOT DETECTED Final   Enterococcus Faecium NOT DETECTED NOT DETECTED Final   Listeria monocytogenes NOT DETECTED NOT DETECTED Final   Staphylococcus species NOT DETECTED NOT DETECTED Final   Staphylococcus  aureus (BCID) NOT DETECTED NOT DETECTED Final   Staphylococcus epidermidis NOT DETECTED NOT DETECTED Final   Staphylococcus lugdunensis NOT DETECTED NOT DETECTED Final   Streptococcus species NOT DETECTED NOT DETECTED Final   Streptococcus agalactiae NOT DETECTED NOT DETECTED Final   Streptococcus pneumoniae NOT DETECTED NOT DETECTED Final   Streptococcus pyogenes NOT DETECTED NOT DETECTED Final   A.calcoaceticus-baumannii NOT DETECTED NOT DETECTED Final   Bacteroides fragilis NOT DETECTED NOT DETECTED Final   Enterobacterales DETECTED (A) NOT DETECTED Final    Comment: Enterobacterales represent a large order of gram negative bacteria, not a single organism. Refer to culture for further identification. CRITICAL RESULT CALLED TO, READ BACK BY AND VERIFIED WITH: PHARMD M MICHAELS [id number] [phone] FCP    Enterobacter cloacae complex NOT DETECTED NOT DETECTED Final   Escherichia coli NOT DETECTED NOT DETECTED Final   Klebsiella aerogenes NOT DETECTED NOT DETECTED Final   Klebsiella oxytoca NOT DETECTED NOT DETECTED Final   Klebsiella pneumoniae NOT DETECTED NOT DETECTED Final   Proteus species NOT DETECTED NOT DETECTED Final   Salmonella species NOT DETECTED NOT DETECTED Final   Serratia marcescens NOT DETECTED NOT DETECTED Final   Haemophilus influenzae NOT DETECTED NOT DETECTED Final   Neisseria meningitidis NOT DETECTED NOT DETECTED Final   Pseudomonas aeruginosa NOT DETECTED NOT DETECTED Final   Stenotrophomonas maltophilia NOT DETECTED NOT DETECTED Final  Candida albicans NOT DETECTED NOT DETECTED Final   Candida auris NOT DETECTED NOT DETECTED Final   Candida glabrata NOT DETECTED NOT DETECTED Final   Candida krusei NOT DETECTED NOT DETECTED Final   Candida parapsilosis NOT DETECTED NOT DETECTED Final   Candida tropicalis NOT DETECTED NOT DETECTED Final   Cryptococcus neoformans/gattii NOT DETECTED NOT DETECTED Final   CTX-M ESBL NOT DETECTED NOT DETECTED Final   Carbapenem  resistance IMP NOT DETECTED NOT DETECTED Final   Carbapenem resistance KPC NOT DETECTED NOT DETECTED Final   Carbapenem resistance NDM NOT DETECTED NOT DETECTED Final   Carbapenem resist OXA 48 LIKE NOT DETECTED NOT DETECTED Final   Carbapenem resistance VIM NOT DETECTED NOT DETECTED Final    Comment: Performed at [facility], [address] [place], [place] [address]  Respiratory Panel by RT PCR (Flu A&B, Covid) - Nasopharyngeal Swab     Status: None   Collection Time: 06/24/20  6:26 PM   Specimen: Nasopharyngeal Swab  Result Value Ref Range Status   SARS Coronavirus 2 by RT PCR NEGATIVE NEGATIVE Final    Comment: (NOTE) SARS-CoV-2 target nucleic acids are NOT DETECTED.  The SARS-CoV-2 RNA is generally detectable in upper respiratoy specimens during the acute phase of infection. The lowest concentration of SARS-CoV-2 viral copies this assay can detect is 131 copies/mL. A negative result does not preclude SARS-Cov-2 infection and should not be used as the sole basis for treatment or other patient management decisions. A negative result may occur with  improper specimen collection/handling, submission of specimen other than nasopharyngeal swab, presence of viral mutation(s) within the areas targeted by this assay, and inadequate number of viral copies (<131 copies/mL). A negative result must be combined with clinical observations, patient history, and epidemiological information. The expected result is Negative.  Fact Sheet for Patients:  https://www.moore.com/  Fact Sheet for Healthcare Providers:  https://www.young.biz/  This test is no t yet approved or cleared by the [place] FDA and  has been authorized for detection and/or diagnosis of SARS-CoV-2 by FDA under an Emergency Use Authorization (EUA). This EUA will remain  in effect (meaning this test can be used) for the duration of the COVID-19 declaration under Section  564(b)(1) of the Act, 21 U.S.C. section 360bbb-3(b)(1), unless the authorization is terminated or revoked sooner.     Influenza A by PCR NEGATIVE NEGATIVE Final   Influenza B by PCR NEGATIVE NEGATIVE Final    Comment: (NOTE) The Xpert Xpress SARS-CoV-2/FLU/RSV assay is intended as an aid in  the diagnosis of influenza from Nasopharyngeal swab specimens and  should not be used as a sole basis for treatment. Nasal washings and  aspirates are unacceptable for Xpert Xpress SARS-CoV-2/FLU/RSV  testing.  Fact Sheet for Patients: https://www.moore.com/  Fact Sheet for Healthcare Providers: https://www.young.biz/  This test is not yet approved or cleared by the [place] FDA and  has been authorized for detection and/or diagnosis of SARS-CoV-2 by  FDA under an Emergency Use Authorization (EUA). This EUA will remain  in effect (meaning this test can be used) for the duration of the  Covid-19 declaration under Section 564(b)(1) of the Act, 21  U.S.C. section 360bbb-3(b)(1), unless the authorization is  terminated or revoked. Performed at [facility], [address], [place], [place] [address]   Urine culture     Status: Abnormal   Collection Time: 06/24/20  6:28 PM   Specimen: In/Out Cath Urine  Result Value Ref Range Status  Specimen Description   Final    IN/OUT CATH URINE Performed at [facility], [address], [place], [place] [address]    Special Requests   Final    NONE Performed at [facility], [address] [place], [place] [address]    Culture 70,000 COLONIES/mL CITROBACTER FREUNDII (A)  Final   Report Status 06/26/2020 FINAL  Final   Organism ID, Bacteria CITROBACTER FREUNDII (A)  Final      Susceptibility   Citrobacter freundii - MIC*    CEFAZOLIN >=64 RESISTANT Resistant     CEFEPIME <=0.12 SENSITIVE Sensitive     CEFTRIAXONE <=0.25 SENSITIVE Sensitive      CIPROFLOXACIN <=0.25 SENSITIVE Sensitive     GENTAMICIN >=16 RESISTANT Resistant     IMIPENEM 1 SENSITIVE Sensitive     NITROFURANTOIN <=16 SENSITIVE Sensitive     TRIMETH/SULFA <=20 SENSITIVE Sensitive     PIP/TAZO <=4 SENSITIVE Sensitive     * 70,000 COLONIES/mL CITROBACTER FREUNDII  Surgical pcr screen     Status: None   Collection Time: 06/30/20 12:54 PM   Specimen: Nasal Mucosa; Nasal Swab  Result Value Ref Range Status   MRSA, PCR NEGATIVE NEGATIVE Final   Staphylococcus aureus NEGATIVE NEGATIVE Final    Comment: (NOTE) The Xpert SA Assay (FDA approved for NASAL specimens in patients 110 years of age and older), is one component of a comprehensive surveillance program. It is not intended to diagnose infection nor to guide or monitor treatment. Performed at [facility], [address] [place], [place] [address]          Radiology Studies: DG C-Arm 1-60 Min-No Report  Result Date: 06/30/2020 Fluoroscopy was utilized by the requesting physician.  No radiographic interpretation.   CT RENAL STONE STUDY  Result Date: 06/29/2020 CLINICAL DATA:  Flank pain EXAM: CT ABDOMEN AND PELVIS WITHOUT CONTRAST TECHNIQUE: Multidetector CT imaging of the abdomen and pelvis was performed following the standard protocol without IV contrast. COMPARISON:  Ultrasound from 06/27/2020, CT from 06/16/2020 FINDINGS: Lower chest: Persistent but mildly improved infiltrate in the right upper lobe. Some stable subpleural nodules are noted on the right as well. No new focal infiltrate is seen. Hepatobiliary: No focal liver abnormality is seen. Status post cholecystectomy. No biliary dilatation. Pancreas: Unremarkable. No pancreatic ductal dilatation or surrounding inflammatory changes. Spleen: Normal in size without focal abnormality. Adrenals/Urinary Tract: Adrenal glands are within normal limits. Fullness of the renal collecting system on the right is seen increased when compared with  the prior exam with evidence of migration of multiple calculi into the proximal right ureter. The largest of these measures approximately 6-7 mm. Residual stones are noted in the lower pole of the right kidney similar to that noted on the prior exam. Renal cystic change is again identified and stable. Left kidney demonstrates no definitive calculi. The bladder is decompressed by Foley catheter. Stomach/Bowel: Appendix is within normal limits. Scattered diverticular changes noted within the colon without evidence of diverticulitis. No small bowel abnormality is noted. Hiatal hernia is noted. Vascular/Lymphatic: Aortic atherosclerosis. No enlarged abdominal or pelvic lymph nodes. Reproductive: Prostate is enlarged. Other: No abdominal wall hernia or abnormality. No abdominopelvic ascites. Musculoskeletal: Mild degenerative changes of lumbar spine are noted. 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 The largest of these measures approximately 6-7 mm. They remain lower pole calculi on the right stable from the prior exam. Mild hydronephrosis is noted on the right. Small hiatal hernia. Diverticulosis  without diverticulitis. Renal cystic changes noted stable from the prior study. Electronically Signed   By: Alcide CleverMark  Lukens M.D.   On: 06/29/2020 16:37        Scheduled Meds: . Chlorhexidine Gluconate Cloth  6 each Topical Daily  . enoxaparin (LOVENOX) injection  40 mg Subcutaneous Q24H  . finasteride  5 mg Oral Daily  . haloperidol lactate  2 mg Intravenous Once  . tamsulosin  0.4 mg Oral QPC supper   Continuous Infusions: . sodium chloride 100 mL/hr at 07/01/20 0739  . ceFEPime (MAXIPIME) IV 2 g (07/01/20 1159)     LOS: 7 days   Time spent: 25mins Greater than 50% of this time was spent in counseling, explanation of diagnosis, planning of further management, and coordination of care.  I have personally reviewed and interpreted on  07/01/2020 daily labs, I reviewed all  nursing notes, pharmacy notes, consultant notes,  vitals, pertinent old records  I have discussed plan of care as described above with RN , patient and family on 07/01/2020  Voice Recognition /Dragon dictation system was used to create this note, attempts have been made to correct errors. Please contact the author with questions and/or clarifications.   Albertine GratesFang Jamisyn Langer, MD PhD FACP [facility]  Available via Epic secure chat 7am-7pm for nonurgent issues Please page for urgent issues To page the attending provider between 7A-7P or the covering provider during after hours 7P-7A, please log into the web site [email or web address] and access using universal [place] password for that web site. If you do not have the password, please call the hospital operator.    07/01/2020, 1:28 PM

## 2020-07-01 NOTE — Progress Notes (Signed)
PT Cancellation Note  Patient Details Name: William Bishop MRN: [id number] DOB: December 04, 1940   Cancelled Treatment:    Reason Eval/Treat Not Completed: Other (comment) RN in with pt attempting to place dentures this morning.  Upon checking back this afternoon, lab trying to draw blood.  Will check back as schedule permits.   Audyn Dimercurio,KATHrine E 07/01/2020, 2:23 PM Kati PT, DPT Acute Rehabilitation Services Pager: [phone] Office: [phone]

## 2020-07-02 DIAGNOSIS — N390 Urinary tract infection, site not specified: Secondary | ICD-10-CM | POA: Diagnosis not present

## 2020-07-02 DIAGNOSIS — A419 Sepsis, unspecified organism: Secondary | ICD-10-CM | POA: Diagnosis not present

## 2020-07-02 LAB — CBC WITH DIFFERENTIAL/PLATELET
Abs Immature Granulocytes: 0.12 10*3/uL — ABNORMAL HIGH (ref 0.00–0.07)
Basophils Absolute: 0.1 10*3/uL (ref 0.0–0.1)
Basophils Relative: 1 %
Eosinophils Absolute: 0.2 10*3/uL (ref 0.0–0.5)
Eosinophils Relative: 2 %
HCT: 36.6 % — ABNORMAL LOW (ref 39.0–52.0)
Hemoglobin: 13 g/dL (ref 13.0–17.0)
Immature Granulocytes: 1 %
Lymphocytes Relative: 24 %
Lymphs Abs: 2 10*3/uL (ref 0.7–4.0)
MCH: 33 pg (ref 26.0–34.0)
MCHC: 35.5 g/dL (ref 30.0–36.0)
MCV: 92.9 fL (ref 80.0–100.0)
Monocytes Absolute: 0.6 10*3/uL (ref 0.1–1.0)
Monocytes Relative: 7 %
Neutro Abs: 5.4 10*3/uL (ref 1.7–7.7)
Neutrophils Relative %: 65 %
Platelets: 153 10*3/uL (ref 150–400)
RBC: 3.94 MIL/uL — ABNORMAL LOW (ref 4.22–5.81)
RDW: 13.6 % (ref 11.5–15.5)
WBC: 8.3 10*3/uL (ref 4.0–10.5)
nRBC: 0 % (ref 0.0–0.2)

## 2020-07-02 LAB — BASIC METABOLIC PANEL
Anion gap: 6 (ref 5–15)
BUN: 15 mg/dL (ref 8–23)
CO2: 26 mmol/L (ref 22–32)
Calcium: 8.6 mg/dL — ABNORMAL LOW (ref 8.9–10.3)
Chloride: 109 mmol/L (ref 98–111)
Creatinine, Ser: 1.02 mg/dL (ref 0.61–1.24)
GFR, Estimated: >60 mL/min (ref >60)
Glucose, Bld: 92 mg/dL (ref 70–99)
Potassium: 3.6 mmol/L (ref 3.5–5.1)
Sodium: 141 mmol/L (ref 135–145)

## 2020-07-02 MED ORDER — SODIUM CHLORIDE 0.9 % IR SOLN: 3000.0000 mL | Status: DC

## 2020-07-02 MED ORDER — LORAZEPAM 2 MG/ML IJ SOLN
0.5000 mg | Freq: Four times a day (QID) | INTRAMUSCULAR | Status: AC | PRN
  Administered 2020-07-02 – 2020-07-03 (×3): 0.5 mg via INTRAVENOUS
  Filled 2020-07-02 (×3): qty 1

## 2020-07-02 NOTE — Progress Notes (Signed)
Patient continues to have bloody urine from foley, multiple irrigations performed. Patient consisting pulling at foley catheter and trying to get out of bed, administered pain meds with no relief, notified NP, she ordered one time dose of Ativan, the medication was highly effective. Patient is resting quietly no distress noted at this time.

## 2020-07-02 NOTE — Progress Notes (Signed)
PROGRESS NOTE    William Bishop  RSW:[id number] DOB: 07-25-1941 DOA: 06/24/2020 PCP: [name], MD    Chief Complaint  Patient presents with  . Code Sepsis  . Altered Mental Status    Brief Narrative:  79 year old male with history of severe dementia, BPH, urinary retention s/p indwelling Foley that was recently removed presenting with increased confusion and fever, and admitted for severe sepsis due to Citrobacter freundii UTI and bacteremia.  Started on cefepime on 06/24/2020.  Indwelling Foley catheter reinserted due to urinary retention.  Subjective:  He was agitated last night, received ativan, he refused am labs due to agitation Wife states patient has not  Been himself for several days Per RN, patient kept on pulling foley, urine is more bloody, catheter was clogged and needs to be flushed several times  He continue to have intermittent hiccups   Wife at bedside   Assessment & Plan:   Principal Problem:   Sepsis secondary to UTI [facility]) Active Problems:   Acute lower UTI   Sepsis (HCC)   Sepsis due to Citrobacter bacteremia and UTI present on admission Right ureteral stone status post stent placement on November 23 Continue IV antibiotic cefepime, blood culture no growth  Catheter associated UTI -Foley removed prior to admission but reinserted due to urinary retention/BPH - -Continue Flomax  Gross hematurai -catheter clogged up by blood clots needs be flushed multiple time,s urine now gross hematuria, case discussed with urology Dr [name] who recommend change foley to 3way 48fr and CBI until urine clears, then can go home with it -hold lovenox, and scd's due to blood noticed around catheter, monitor H&H  AKI on CKD 2 -Appear resolving, continue monitor renal function -DC IV hydration  Hypertension Home blood pressure Imdur held due to sepsis, likely able to resume tomorrow  Dementia With acute metabolic encephalopathy on presentation, wife state  mental status has improved but in the last two days he has not been himself Resume nightly Seroquel Delirium protocol   DVT prophylaxis: Place and maintain sequential compression device Start: 07/01/20 1332   Code Status: DNR Family Communication: Wife Disposition:   Status is: Inpatient   Dispo: The patient is from: Home              Anticipated d/c is to: Home with home health RN to help manage foley              Anticipated d/c date is: needs cbi for gross hematuria                Consultants:   Urology  Procedures:   Right ureteral stent placement on 11/23  CBI from 11/25  Antimicrobials:   As above     Objective: Vitals:   07/01/20 1201 07/01/20 1202 07/01/20 2124 07/02/20 0500  BP: (!) 148/85 (!) 148/85 117/80   Pulse: 86 88 91   Resp: 15 15 18    Temp: 98.3 F (36.8 C) 98.3 F (36.8 C) 98 F (36.7 C)   TempSrc:   Axillary   SpO2: 98% 97% 94%   Weight:    94.8 kg  Height:        Intake/Output Summary (Last 24 hours) at 07/02/2020 1220 Last data filed at 07/02/2020 0500 Gross per 24 hour  Intake --  Output 2050 ml  Net -2050 ml   Filed Weights   06/25/20 0116 06/30/20 1805 07/02/20 0500  Weight: 100.3 kg 100.3 kg 94.8 kg    Examination:  General exam:  Demented, drowsy, Foley in place with gross hematuria  Respiratory system: Clear to auscultation. Respiratory effort normal. Cardiovascular system: S1 & S2 heard, RRR. No JVD, no murmur, No pedal edema. Gastrointestinal system: Abdomen is nondistended, soft and nontender. Normal bowel sounds heard. Central nervous system: drowsy Extremities: moving all extremities  Skin: No rashes, lesions or ulcers Psychiatry: Demented    Data Reviewed: I have personally reviewed following labs and imaging studies  CBC: Recent Labs  Lab 06/26/20 0637 06/27/20 0608 06/28/20 0550 07/01/20 0923  WBC 10.9* 8.2 7.3 11.1*  NEUTROABS  --   --   --  8.9*  HGB 13.6 12.9* 13.4 14.3  HCT 38.3* 36.2* 38.2*  40.7  MCV 93.2 93.1 92.7 93.3  PLT 105* 104* 119* 152    Basic Metabolic Panel: Recent Labs  Lab 06/27/20 0608 06/28/20 0550 06/29/20 0540 06/30/20 0543 07/01/20 0923  NA 140 141 137 138 137  K 3.2* 3.7 3.6 3.7 4.1  CL 108 109 106 109 106  CO2 23 24 22  20* 21*  GLUCOSE 86 89 91 88 103*  BUN 24* 22 21 20 17   CREATININE 1.65* 1.83* 1.82* 1.65* 1.03  CALCIUM 8.3* 8.5* 8.4* 8.4* 8.7*  MG 2.0  --  2.1 2.1 2.1  PHOS  --   --  2.3* 2.6 2.0*    GFR: Estimated Creatinine Clearance: 62.7 mL/min (by C-G formula based on SCr of 1.03 mg/dL).  Liver Function Tests: Recent Labs  Lab 06/26/20 0637 06/26/20 0637 06/27/20 0608 06/28/20 0550 06/29/20 0540 06/30/20 0543 07/01/20 0923  AST 76*  --  43* 33  --   --   --   ALT 47*  --  33 28  --   --   --   ALKPHOS 85  --  69 68  --   --   --   BILITOT 1.7*  --  1.0 1.1  --   --   --   PROT 5.8*  --  5.2* 5.7*  --   --   --   ALBUMIN 3.2*   < > 2.8* 2.9* 3.0* 3.1* 3.2*   < > = values in this interval not displayed.    CBG: Recent Labs  Lab 06/27/20 2012 06/28/20 1455 07/01/20 1155 07/01/20 1704  GLUCAP 90 90 84 102*     Recent Results (from the past 240 hour(s))  Blood Culture (routine x 2)     Status: Abnormal   Collection Time: 06/24/20  6:07 PM   Specimen: BLOOD  Result Value Ref Range Status   Specimen Description   Final    BLOOD LEFT ANTECUBITAL Performed at [facility], [address] [place], [place] [address]    Special Requests   Final    BOTTLES DRAWN AEROBIC AND ANAEROBIC Blood Culture adequate volume Performed at [facility], [address] [place], [place] [address]    Culture  Setup Time   Final    GRAM NEGATIVE RODS IN BOTH AEROBIC AND ANAEROBIC BOTTLES CRITICAL VALUE NOTED.  VALUE IS CONSISTENT WITH PREVIOUSLY REPORTED AND CALLED VALUE.    Culture (A)  Final    CITROBACTER FREUNDII SUSCEPTIBILITIES PERFORMED ON PREVIOUS CULTURE WITHIN THE LAST 5  DAYS. Performed at [facility], [address] [place], [place] [address]    Report Status 06/27/2020 FINAL  Final  Blood Culture (routine x 2)     Status: Abnormal   Collection Time: 06/24/20  6:12 PM   Specimen:  BLOOD  Result Value Ref Range Status   Specimen Description   Final    BLOOD RIGHT ANTECUBITAL Performed at [facility], [address] [place], [place] [address]    Special Requests   Final    BOTTLES DRAWN AEROBIC AND ANAEROBIC Blood Culture adequate volume Performed at [facility], [address], [place], [place] [address]    Culture  Setup Time   Final    IN BOTH AEROBIC AND ANAEROBIC BOTTLES GRAM NEGATIVE RODS CRITICAL RESULT CALLED TO, READ BACK BY AND VERIFIED WITH: Kathrynn Speed [id number] [id number] FCP Performed at [facility], [address], [place], [place] [address]    Culture CITROBACTER FREUNDII (A)  Final   Report Status 06/27/2020 FINAL  Final   Organism ID, Bacteria CITROBACTER FREUNDII  Final      Susceptibility   Citrobacter freundii - MIC*    CEFAZOLIN >=64 RESISTANT Resistant     CEFEPIME <=0.12 SENSITIVE Sensitive     CEFTAZIDIME <=1 SENSITIVE Sensitive     CEFTRIAXONE 8 RESISTANT Resistant     CIPROFLOXACIN <=0.25 SENSITIVE Sensitive     GENTAMICIN >=16 RESISTANT Resistant     IMIPENEM 1 SENSITIVE Sensitive     TRIMETH/SULFA >=320 RESISTANT Resistant     PIP/TAZO <=4 SENSITIVE Sensitive     * CITROBACTER FREUNDII  Blood Culture ID Panel (Reflexed)     Status: Abnormal   Collection Time: 06/24/20  6:12 PM  Result Value Ref Range Status   Enterococcus faecalis NOT DETECTED NOT DETECTED Final   Enterococcus Faecium NOT DETECTED NOT DETECTED Final   Listeria monocytogenes NOT DETECTED NOT DETECTED Final   Staphylococcus species NOT DETECTED NOT DETECTED Final   Staphylococcus aureus (BCID) NOT DETECTED NOT DETECTED Final   Staphylococcus epidermidis NOT DETECTED NOT DETECTED Final    Staphylococcus lugdunensis NOT DETECTED NOT DETECTED Final   Streptococcus species NOT DETECTED NOT DETECTED Final   Streptococcus agalactiae NOT DETECTED NOT DETECTED Final   Streptococcus pneumoniae NOT DETECTED NOT DETECTED Final   Streptococcus pyogenes NOT DETECTED NOT DETECTED Final   A.calcoaceticus-baumannii NOT DETECTED NOT DETECTED Final   Bacteroides fragilis NOT DETECTED NOT DETECTED Final   Enterobacterales DETECTED (A) NOT DETECTED Final    Comment: Enterobacterales represent a large order of gram negative bacteria, not a single organism. Refer to culture for further identification. CRITICAL RESULT CALLED TO, READ BACK BY AND VERIFIED WITH: PHARMD M MICHAELS [id number] FCP    Enterobacter cloacae complex NOT DETECTED NOT DETECTED Final   Escherichia coli NOT DETECTED NOT DETECTED Final   Klebsiella aerogenes NOT DETECTED NOT DETECTED Final   Klebsiella oxytoca NOT DETECTED NOT DETECTED Final   Klebsiella pneumoniae NOT DETECTED NOT DETECTED Final   Proteus species NOT DETECTED NOT DETECTED Final   Salmonella species NOT DETECTED NOT DETECTED Final   Serratia marcescens NOT DETECTED NOT DETECTED Final   Haemophilus influenzae NOT DETECTED NOT DETECTED Final   Neisseria meningitidis NOT DETECTED NOT DETECTED Final   Pseudomonas aeruginosa NOT DETECTED NOT DETECTED Final   Stenotrophomonas maltophilia NOT DETECTED NOT DETECTED Final   Candida albicans NOT DETECTED NOT DETECTED Final   Candida auris NOT DETECTED NOT DETECTED Final   Candida glabrata NOT DETECTED NOT DETECTED Final   Candida krusei NOT DETECTED NOT DETECTED Final   Candida parapsilosis NOT DETECTED NOT DETECTED Final   Candida tropicalis NOT DETECTED NOT DETECTED Final   Cryptococcus neoformans/gattii NOT DETECTED NOT DETECTED Final   CTX-M  ESBL NOT DETECTED NOT DETECTED Final   Carbapenem resistance IMP NOT DETECTED NOT DETECTED Final   Carbapenem resistance KPC NOT DETECTED NOT DETECTED Final    Carbapenem resistance NDM NOT DETECTED NOT DETECTED Final   Carbapenem resist OXA 48 LIKE NOT DETECTED NOT DETECTED Final   Carbapenem resistance VIM NOT DETECTED NOT DETECTED Final    Comment: Performed at [facility], [address] [place], [place] [address]  Respiratory Panel by RT PCR (Flu A&B, Covid) - Nasopharyngeal Swab     Status: None   Collection Time: 06/24/20  6:26 PM   Specimen: Nasopharyngeal Swab  Result Value Ref Range Status   SARS Coronavirus 2 by RT PCR NEGATIVE NEGATIVE Final    Comment: (NOTE) SARS-CoV-2 target nucleic acids are NOT DETECTED.  The SARS-CoV-2 RNA is generally detectable in upper respiratoy specimens during the acute phase of infection. The lowest concentration of SARS-CoV-2 viral copies this assay can detect is 131 copies/mL. A negative result does not preclude SARS-Cov-2 infection and should not be used as the sole basis for treatment or other patient management decisions. A negative result may occur with  improper specimen collection/handling, submission of specimen other than nasopharyngeal swab, presence of viral mutation(s) within the areas targeted by this assay, and inadequate number of viral copies (<131 copies/mL). A negative result must be combined with clinical observations, patient history, and epidemiological information. The expected result is Negative.  Fact Sheet for Patients:  https://www.moore.com/  Fact Sheet for Healthcare Providers:  https://www.young.biz/  This test is no t yet approved or cleared by the [place] FDA and  has been authorized for detection and/or diagnosis of SARS-CoV-2 by FDA under an Emergency Use Authorization (EUA). This EUA will remain  in effect (meaning this test can be used) for the duration of the COVID-19 declaration under Section 564(b)(1) of the Act, 21 U.S.C. section 360bbb-3(b)(1), unless the authorization is terminated or revoked  sooner.     Influenza A by PCR NEGATIVE NEGATIVE Final   Influenza B by PCR NEGATIVE NEGATIVE Final    Comment: (NOTE) The Xpert Xpress SARS-CoV-2/FLU/RSV assay is intended as an aid in  the diagnosis of influenza from Nasopharyngeal swab specimens and  should not be used as a sole basis for treatment. Nasal washings and  aspirates are unacceptable for Xpert Xpress SARS-CoV-2/FLU/RSV  testing.  Fact Sheet for Patients: https://www.moore.com/  Fact Sheet for Healthcare Providers: https://www.young.biz/  This test is not yet approved or cleared by the [place] FDA and  has been authorized for detection and/or diagnosis of SARS-CoV-2 by  FDA under an Emergency Use Authorization (EUA). This EUA will remain  in effect (meaning this test can be used) for the duration of the  Covid-19 declaration under Section 564(b)(1) of the Act, 21  U.S.C. section 360bbb-3(b)(1), unless the authorization is  terminated or revoked. Performed at [facility], [address], [place], [place] [address]   Urine culture     Status: Abnormal   Collection Time: 06/24/20  6:28 PM   Specimen: In/Out Cath Urine  Result Value Ref Range Status   Specimen Description   Final    IN/OUT CATH URINE Performed at [facility], [address], [place], [place] [address]    Special Requests   Final    NONE Performed at [facility], [address], [place], [place] [address]    Culture 70,000 COLONIES/mL CITROBACTER FREUNDII (A)  Final   Report Status  06/26/2020 FINAL  Final   Organism ID, Bacteria CITROBACTER FREUNDII (A)  Final      Susceptibility   Citrobacter freundii - MIC*    CEFAZOLIN >=64 RESISTANT Resistant     CEFEPIME <=0.12 SENSITIVE Sensitive     CEFTRIAXONE <=0.25 SENSITIVE Sensitive     CIPROFLOXACIN <=0.25 SENSITIVE Sensitive     GENTAMICIN >=16 RESISTANT Resistant     IMIPENEM 1 SENSITIVE  Sensitive     NITROFURANTOIN <=16 SENSITIVE Sensitive     TRIMETH/SULFA <=20 SENSITIVE Sensitive     PIP/TAZO <=4 SENSITIVE Sensitive     * 70,000 COLONIES/mL CITROBACTER FREUNDII  Surgical pcr screen     Status: None   Collection Time: 06/30/20 12:54 PM   Specimen: Nasal Mucosa; Nasal Swab  Result Value Ref Range Status   MRSA, PCR NEGATIVE NEGATIVE Final   Staphylococcus aureus NEGATIVE NEGATIVE Final    Comment: (NOTE) The Xpert SA Assay (FDA approved for NASAL specimens in patients 90 years of age and older), is one component of a comprehensive surveillance program. It is not intended to diagnose infection nor to guide or monitor treatment. Performed at [facility], [address] [place], [place] [address]   Culture, blood (routine x 2)     Status: None (Preliminary result)   Collection Time: 07/01/20  2:22 PM   Specimen: BLOOD  Result Value Ref Range Status   Specimen Description   Final    BLOOD LEFT ARM Performed at [facility], [address] [place], [place] [address]    Special Requests   Final    BOTTLES DRAWN AEROBIC ONLY Blood Culture results may not be optimal due to an inadequate volume of blood received in culture bottles   Culture   Final    NO GROWTH < 12 HOURS Performed at [facility], [address], [place], [place] [address]    Report Status PENDING  Incomplete  Culture, blood (routine x 2)     Status: None (Preliminary result)   Collection Time: 07/01/20  2:29 PM   Specimen: BLOOD  Result Value Ref Range Status   Specimen Description   Final    BLOOD RIGHT ANTECUBITAL Performed at [facility], [address] [place], [place] [address]    Special Requests   Final    BOTTLES DRAWN AEROBIC AND ANAEROBIC Blood Culture adequate volume Performed at [facility], [address] [place], [place] [address]    Culture   Final    NO GROWTH < 12 HOURS Performed at  [facility], [address] [place], [place] [address]    Report Status PENDING  Incomplete         Radiology Studies: DG C-Arm 1-60 Min-No Report  Result Date: 06/30/2020 Fluoroscopy was utilized by the requesting physician.  No radiographic interpretation.        Scheduled Meds: . Chlorhexidine Gluconate Cloth  6 each Topical Daily  . finasteride  5 mg Oral Daily  . haloperidol lactate  2 mg Intravenous Once  . QUEtiapine  50 mg Oral QHS  . tamsulosin  0.4 mg Oral QPC supper   Continuous Infusions: . ceFEPime (MAXIPIME) IV 2 g (07/02/20 1203)  . sodium chloride irrigation       LOS: 8 days   Time spent: , case discussed with urology Greater than 50% of this time was spent in counseling, explanation of diagnosis, planning of further management, and coordination of care.  I have personally reviewed and interpreted on  07/02/2020 daily labs, I reviewed all nursing notes, pharmacy notes, consultant notes,  vitals, pertinent old records  I have discussed plan of care as described above with RN , patient and family on 07/02/2020  Voice Recognition /Dragon dictation system was used to create this note, attempts have been made to correct errors. Please contact the author with questions and/or clarifications.   Albertine Grates, MD PhD FACP [facility]  Available via Epic secure chat 7am-7pm for nonurgent issues Please page for urgent issues To page the attending provider between 7A-7P or the covering provider during after hours 7P-7A, please log into the web site [email or web address] and access using universal [place] password for that web site. If you do not have the password, please call the hospital operator.    07/02/2020, 12:20 PM

## 2020-07-02 NOTE — Progress Notes (Signed)
2 Days Post-Op Subjective: Patient continues to pull on foley. He had hematuria this morning which has improved. He remain confused   Objective: Vital signs in last 24 hours: Temp:  [98 F (36.7 C)-98.6 F (37 C)] 98.6 F (37 C) (11/25 1304) Pulse Rate:  [73-91] 73 (11/25 1304) Resp:  [18-19] 19 (11/25 1304) BP: (117-136)/(80-86) 136/86 (11/25 1304) SpO2:  [94 %-96 %] 96 % (11/25 1304) Weight:  [94.8 kg] 94.8 kg (11/25 0500)  Intake/Output from previous day: 11/24 0701 - 11/25 0700 In: -  Out: 2900 [Urine:2900] Intake/Output this shift: Total I/O In: 220 [P.O.:220] Out: -   Physical Exam:  General:appears stated age GI: soft, non tender, normal bowel sounds, no palpable masses, no organomegaly, no inguinal hernia Male genitalia: no penile lesions or discharge no testicular masses no bladder distension noted Extremities: extremities normal, atraumatic, no cyanosis or edema  Lab Results: Recent Labs    07/01/20 0923 07/02/20 1240  HGB 14.3 13.0  HCT 40.7 36.6*   BMET Recent Labs    07/01/20 0923 07/02/20 1240  NA 137 141  K 4.1 3.6  CL 106 109  CO2 21* 26  GLUCOSE 103* 92  BUN 17 15  CREATININE 1.03 1.02  CALCIUM 8.7* 8.6*   No results for input(s): LABPT, INR in the last 72 hours. No results for input(s): LABURIN in the last 72 hours. Results for orders placed or performed during the hospital encounter of 06/24/20  Blood Culture (routine x 2)     Status: Abnormal   Collection Time: 06/24/20  6:07 PM   Specimen: BLOOD  Result Value Ref Range Status   Specimen Description   Final    BLOOD LEFT ANTECUBITAL Performed at [facility], [address] [place], [place] [address]    Special Requests   Final    BOTTLES DRAWN AEROBIC AND ANAEROBIC Blood Culture adequate volume Performed at [facility], [address] [place], [place] [address]    Culture  Setup Time   Final    GRAM NEGATIVE RODS IN BOTH AEROBIC AND  ANAEROBIC BOTTLES CRITICAL VALUE NOTED.  VALUE IS CONSISTENT WITH PREVIOUSLY REPORTED AND CALLED VALUE.    Culture (A)  Final    CITROBACTER FREUNDII SUSCEPTIBILITIES PERFORMED ON PREVIOUS CULTURE WITHIN THE LAST 5 DAYS. Performed at [facility], [address] [place], [place] [address]    Report Status 06/27/2020 FINAL  Final  Blood Culture (routine x 2)     Status: Abnormal   Collection Time: 06/24/20  6:12 PM   Specimen: BLOOD  Result Value Ref Range Status   Specimen Description   Final    BLOOD RIGHT ANTECUBITAL Performed at [facility], [address], [place], [place] [address]    Special Requests   Final    BOTTLES DRAWN AEROBIC AND ANAEROBIC Blood Culture adequate volume Performed at [facility], [address], [place], [place] [address]    Culture  Setup Time   Final    IN BOTH AEROBIC AND ANAEROBIC BOTTLES GRAM NEGATIVE RODS CRITICAL RESULT CALLED TO, READ BACK BY AND VERIFIED WITH: Kathrynn Speed [id number] FCP Performed at [facility], [address] [place], [place] [address]    Culture CITROBACTER FREUNDII (A)  Final   Report Status 06/27/2020 FINAL  Final   Organism ID, Bacteria CITROBACTER FREUNDII  Final      Susceptibility   Citrobacter freundii - MIC*  CEFAZOLIN >=64 RESISTANT Resistant     CEFEPIME <=0.12 SENSITIVE Sensitive     CEFTAZIDIME <=1 SENSITIVE Sensitive     CEFTRIAXONE 8 RESISTANT Resistant     CIPROFLOXACIN <=0.25 SENSITIVE Sensitive     GENTAMICIN >=16 RESISTANT Resistant     IMIPENEM 1 SENSITIVE Sensitive     TRIMETH/SULFA >=320 RESISTANT Resistant     PIP/TAZO <=4 SENSITIVE Sensitive     * CITROBACTER FREUNDII  Blood Culture ID Panel (Reflexed)     Status: Abnormal   Collection Time: 06/24/20  6:12 PM  Result Value Ref Range Status   Enterococcus faecalis NOT DETECTED NOT DETECTED Final   Enterococcus Faecium NOT DETECTED NOT DETECTED Final   Listeria monocytogenes NOT  DETECTED NOT DETECTED Final   Staphylococcus species NOT DETECTED NOT DETECTED Final   Staphylococcus aureus (BCID) NOT DETECTED NOT DETECTED Final   Staphylococcus epidermidis NOT DETECTED NOT DETECTED Final   Staphylococcus lugdunensis NOT DETECTED NOT DETECTED Final   Streptococcus species NOT DETECTED NOT DETECTED Final   Streptococcus agalactiae NOT DETECTED NOT DETECTED Final   Streptococcus pneumoniae NOT DETECTED NOT DETECTED Final   Streptococcus pyogenes NOT DETECTED NOT DETECTED Final   A.calcoaceticus-baumannii NOT DETECTED NOT DETECTED Final   Bacteroides fragilis NOT DETECTED NOT DETECTED Final   Enterobacterales DETECTED (A) NOT DETECTED Final    Comment: Enterobacterales represent a large order of gram negative bacteria, not a single organism. Refer to culture for further identification. CRITICAL RESULT CALLED TO, READ BACK BY AND VERIFIED WITH: PHARMD M MICHAELS [id number] FCP    Enterobacter cloacae complex NOT DETECTED NOT DETECTED Final   Escherichia coli NOT DETECTED NOT DETECTED Final   Klebsiella aerogenes NOT DETECTED NOT DETECTED Final   Klebsiella oxytoca NOT DETECTED NOT DETECTED Final   Klebsiella pneumoniae NOT DETECTED NOT DETECTED Final   Proteus species NOT DETECTED NOT DETECTED Final   Salmonella species NOT DETECTED NOT DETECTED Final   Serratia marcescens NOT DETECTED NOT DETECTED Final   Haemophilus influenzae NOT DETECTED NOT DETECTED Final   Neisseria meningitidis NOT DETECTED NOT DETECTED Final   Pseudomonas aeruginosa NOT DETECTED NOT DETECTED Final   Stenotrophomonas maltophilia NOT DETECTED NOT DETECTED Final   Candida albicans NOT DETECTED NOT DETECTED Final   Candida auris NOT DETECTED NOT DETECTED Final   Candida glabrata NOT DETECTED NOT DETECTED Final   Candida krusei NOT DETECTED NOT DETECTED Final   Candida parapsilosis NOT DETECTED NOT DETECTED Final   Candida tropicalis NOT DETECTED NOT DETECTED Final   Cryptococcus  neoformans/gattii NOT DETECTED NOT DETECTED Final   CTX-M ESBL NOT DETECTED NOT DETECTED Final   Carbapenem resistance IMP NOT DETECTED NOT DETECTED Final   Carbapenem resistance KPC NOT DETECTED NOT DETECTED Final   Carbapenem resistance NDM NOT DETECTED NOT DETECTED Final   Carbapenem resist OXA 48 LIKE NOT DETECTED NOT DETECTED Final   Carbapenem resistance VIM NOT DETECTED NOT DETECTED Final    Comment: Performed at [facility], [address] [place], [place] [address]  Respiratory Panel by RT PCR (Flu A&B, Covid) - Nasopharyngeal Swab     Status: None   Collection Time: 06/24/20  6:26 PM   Specimen: Nasopharyngeal Swab  Result Value Ref Range Status   SARS Coronavirus 2 by RT PCR NEGATIVE NEGATIVE Final    Comment: (NOTE) SARS-CoV-2 target nucleic acids are NOT DETECTED.  The SARS-CoV-2 RNA is generally detectable in upper respiratoy specimens during the acute phase of infection. The lowest concentration of SARS-CoV-2  viral copies this assay can detect is 131 copies/mL. A negative result does not preclude SARS-Cov-2 infection and should not be used as the sole basis for treatment or other patient management decisions. A negative result may occur with  improper specimen collection/handling, submission of specimen other than nasopharyngeal swab, presence of viral mutation(s) within the areas targeted by this assay, and inadequate number of viral copies (<131 copies/mL). A negative result must be combined with clinical observations, patient history, and epidemiological information. The expected result is Negative.  Fact Sheet for Patients:  https://www.moore.com/  Fact Sheet for Healthcare Providers:  https://www.young.biz/  This test is no t yet approved or cleared by the [place] FDA and  has been authorized for detection and/or diagnosis of SARS-CoV-2 by FDA under an Emergency Use Authorization (EUA). This EUA will remain   in effect (meaning this test can be used) for the duration of the COVID-19 declaration under Section 564(b)(1) of the Act, 21 U.S.C. section 360bbb-3(b)(1), unless the authorization is terminated or revoked sooner.     Influenza A by PCR NEGATIVE NEGATIVE Final   Influenza B by PCR NEGATIVE NEGATIVE Final    Comment: (NOTE) The Xpert Xpress SARS-CoV-2/FLU/RSV assay is intended as an aid in  the diagnosis of influenza from Nasopharyngeal swab specimens and  should not be used as a sole basis for treatment. Nasal washings and  aspirates are unacceptable for Xpert Xpress SARS-CoV-2/FLU/RSV  testing.  Fact Sheet for Patients: https://www.moore.com/  Fact Sheet for Healthcare Providers: https://www.young.biz/  This test is not yet approved or cleared by the [place] FDA and  has been authorized for detection and/or diagnosis of SARS-CoV-2 by  FDA under an Emergency Use Authorization (EUA). This EUA will remain  in effect (meaning this test can be used) for the duration of the  Covid-19 declaration under Section 564(b)(1) of the Act, 21  U.S.C. section 360bbb-3(b)(1), unless the authorization is  terminated or revoked. Performed at [facility], [address], [place], [place] [address]   Urine culture     Status: Abnormal   Collection Time: 06/24/20  6:28 PM   Specimen: In/Out Cath Urine  Result Value Ref Range Status   Specimen Description   Final    IN/OUT CATH URINE Performed at [facility], [address] [place], [place] [address]    Special Requests   Final    NONE Performed at [facility], [address] [place], [place] [address]    Culture 70,000 COLONIES/mL CITROBACTER FREUNDII (A)  Final   Report Status 06/26/2020 FINAL  Final   Organism ID, Bacteria CITROBACTER FREUNDII (A)  Final      Susceptibility   Citrobacter freundii - MIC*    CEFAZOLIN >=64 RESISTANT  Resistant     CEFEPIME <=0.12 SENSITIVE Sensitive     CEFTRIAXONE <=0.25 SENSITIVE Sensitive     CIPROFLOXACIN <=0.25 SENSITIVE Sensitive     GENTAMICIN >=16 RESISTANT Resistant     IMIPENEM 1 SENSITIVE Sensitive     NITROFURANTOIN <=16 SENSITIVE Sensitive     TRIMETH/SULFA <=20 SENSITIVE Sensitive     PIP/TAZO <=4 SENSITIVE Sensitive     * 70,000 COLONIES/mL CITROBACTER FREUNDII  Surgical pcr screen     Status: None   Collection Time: 06/30/20 12:54 PM   Specimen: Nasal Mucosa; Nasal Swab  Result Value Ref Range Status   MRSA, PCR NEGATIVE NEGATIVE Final   Staphylococcus aureus NEGATIVE NEGATIVE Final    Comment: (NOTE) The Xpert  SA Assay (FDA approved for NASAL specimens in patients 30 years of age and older), is one component of a comprehensive surveillance program. It is not intended to diagnose infection nor to guide or monitor treatment. Performed at [facility], [address] [place], [place] [address]   Culture, blood (routine x 2)     Status: None (Preliminary result)   Collection Time: 07/01/20  2:22 PM   Specimen: BLOOD  Result Value Ref Range Status   Specimen Description   Final    BLOOD LEFT ARM Performed at [facility], [address] [place], [place] [address]    Special Requests   Final    BOTTLES DRAWN AEROBIC ONLY Blood Culture results may not be optimal due to an inadequate volume of blood received in culture bottles   Culture   Final    NO GROWTH < 12 HOURS Performed at [facility], [address] [place], [place] [address]    Report Status PENDING  Incomplete  Culture, blood (routine x 2)     Status: None (Preliminary result)   Collection Time: 07/01/20  2:29 PM   Specimen: BLOOD  Result Value Ref Range Status   Specimen Description   Final    BLOOD RIGHT ANTECUBITAL Performed at [facility], [address], [place], [place] [address]    Special Requests   Final    BOTTLES  DRAWN AEROBIC AND ANAEROBIC Blood Culture adequate volume Performed at [facility], [address] [place], [place] [address]    Culture   Final    NO GROWTH < 12 HOURS Performed at [facility], [address] [place], [place] [address]    Report Status PENDING  Incomplete    Studies/Results: DG C-Arm 1-60 Min-No Report  Result Date: 06/30/2020 Fluoroscopy was utilized by the requesting physician.  No radiographic interpretation.    Assessment/Plan: 79yo with Gross hematuria and urinary retention  1. 22 French foley was placed, filled balloon to 30cc, and I irrigated 60cc of saline. Urine was clear. No indication for CBI at this time.     LOS: 8 days   Wilkie Aye 07/02/2020, 6:07 PM

## 2020-07-03 DIAGNOSIS — N390 Urinary tract infection, site not specified: Secondary | ICD-10-CM | POA: Diagnosis not present

## 2020-07-03 DIAGNOSIS — A419 Sepsis, unspecified organism: Secondary | ICD-10-CM | POA: Diagnosis not present

## 2020-07-03 LAB — BASIC METABOLIC PANEL
Anion gap: 10 (ref 5–15)
BUN: 13 mg/dL (ref 8–23)
CO2: 24 mmol/L (ref 22–32)
Calcium: 9 mg/dL (ref 8.9–10.3)
Chloride: 107 mmol/L (ref 98–111)
Creatinine, Ser: 1.05 mg/dL (ref 0.61–1.24)
GFR, Estimated: >60 mL/min (ref >60)
Glucose, Bld: 95 mg/dL (ref 70–99)
Potassium: 3.7 mmol/L (ref 3.5–5.1)
Sodium: 141 mmol/L (ref 135–145)

## 2020-07-03 LAB — CBC WITH DIFFERENTIAL/PLATELET
Abs Immature Granulocytes: 0.06 10*3/uL (ref 0.00–0.07)
Basophils Absolute: 0.1 10*3/uL (ref 0.0–0.1)
Basophils Relative: 1 %
Eosinophils Absolute: 0.2 10*3/uL (ref 0.0–0.5)
Eosinophils Relative: 3 %
HCT: 39.5 % (ref 39.0–52.0)
Hemoglobin: 13.8 g/dL (ref 13.0–17.0)
Immature Granulocytes: 1 %
Lymphocytes Relative: 25 %
Lymphs Abs: 1.9 10*3/uL (ref 0.7–4.0)
MCH: 32.5 pg (ref 26.0–34.0)
MCHC: 34.9 g/dL (ref 30.0–36.0)
MCV: 93.2 fL (ref 80.0–100.0)
Monocytes Absolute: 0.5 10*3/uL (ref 0.1–1.0)
Monocytes Relative: 6 %
Neutro Abs: 4.9 10*3/uL (ref 1.7–7.7)
Neutrophils Relative %: 64 %
Platelets: 196 10*3/uL (ref 150–400)
RBC: 4.24 MIL/uL (ref 4.22–5.81)
RDW: 13.7 % (ref 11.5–15.5)
WBC: 7.6 10*3/uL (ref 4.0–10.5)
nRBC: 0 % (ref 0.0–0.2)

## 2020-07-03 MED ORDER — SODIUM CHLORIDE 0.9 % IV SOLN
2.0000 g | Freq: Three times a day (TID) | INTRAVENOUS | Status: AC
  Administered 2020-07-03 – 2020-07-06 (×10): 2 g via INTRAVENOUS
  Filled 2020-07-03 (×10): qty 2

## 2020-07-03 MED ORDER — LORAZEPAM 2 MG/ML IJ SOLN
0.5000 mg | Freq: Three times a day (TID) | INTRAMUSCULAR | Status: DC | PRN
  Administered 2020-07-04 – 2020-07-07 (×8): 0.5 mg via INTRAVENOUS
  Filled 2020-07-03 (×9): qty 1

## 2020-07-03 MED ORDER — SODIUM CHLORIDE 0.9 % IV SOLN
2.0000 g | Freq: Three times a day (TID) | INTRAVENOUS | Status: DC
  Administered 2020-07-03: 2 g via INTRAVENOUS
  Filled 2020-07-03 (×3): qty 2

## 2020-07-03 NOTE — Progress Notes (Addendum)
Pt continues to be confused and not following commands, attempting to get out of bed. Mitts applied but Pt rips them off when staff leaves room. Pt snapped IV tubing causing IV to bleed. IV site redressed and flushed. Pt linen changed. PRN IV ativan given and order renewed by Dr. Roda Shutters.

## 2020-07-03 NOTE — Progress Notes (Signed)
PHARMACY NOTE:  ANTIMICROBIAL RENAL DOSAGE ADJUSTMENT  Current antimicrobial regimen includes a mismatch between antimicrobial dosage and estimated renal function.  As per policy approved by the Pharmacy & Therapeutics and Medical Executive Committees, the antimicrobial dosage will be adjusted accordingly.  Current antimicrobial dosage:  Cefepime 2gm q12h  Indication: bacteremia  Renal Function:  Estimated Creatinine Clearance: 62.6 mL/min (by C-G formula based on SCr of 1.02 mg/dL). []      On intermittent HD, scheduled: []      On CRRT    Antimicrobial dosage has been changed to:  Cefepime 2gm IV q8h   Thank you for allowing pharmacy to be a part of this patient's care.  , [facility] 07/03/2020 8:59 AM

## 2020-07-03 NOTE — Progress Notes (Signed)
PROGRESS NOTE    William Bishop  WJX:[id number] DOB: 1940-10-05 DOA: 06/24/2020 PCP: [name], MD    Chief Complaint  Patient presents with  . Code Sepsis  . Altered Mental Status    Brief Narrative:  79 year old male with history of severe dementia, BPH, urinary retention s/p indwelling Foley that was recently removed presenting with increased confusion and fever, and admitted for severe sepsis due to Citrobacter freundii UTI and bacteremia.  Started on cefepime on 06/24/2020.  Indwelling Foley catheter reinserted due to urinary retention.  Subjective:   Per RN patient had a lot of pain and urine was bloody yesterday, cbi started after flushed several dark red clots out, urine is less dark this am He was restless received iv ativan and iv dilaudid last night, this am he is confused /drowsy with intermittent restlessness Per RN patient pulling on foley intermittently  He continues to have intermittent hiccups      Assessment & Plan:   Principal Problem:   Sepsis secondary to UTI [facility]) Active Problems:   Acute lower UTI   Sepsis (HCC)   Sepsis due to Citrobacter bacteremia and UTI present on admission Right ureteral stone status post stent placement on November 23 Continue IV antibiotic cefepime, blood culture no growth  Catheter associated UTI -Foley removed prior to admission but reinserted due to urinary retention/BPH - -Continue Flomax  Gross hematurai -catheter clogged up by blood clots needs be flushed multiple time,s urine now gross hematuria, case discussed with urology Dr [name] who recommend change foley to 3way 28fr and CBI until urine clears, then can keep it in at discharge -hold lovenox, and scd's due to blood noticed around catheter, monitor H&H  AKI on CKD 2 -Appear resolving, continue monitor renal function -DC IV hydration  Hypertension Home blood pressure Imdur held due to sepsis, likely able to resume tomorrow  Dementia With acute  metabolic encephalopathy on presentation, wife state mental status has improved but in the last two days he has not been himself, wife reports patient at baseline recognize her intermittently, this morning patient recognized her Resume nightly Seroquel Delirium protocol   He is very deconditioned and confused, needs a lot of care , wife not able to manage foley at home, he needs snf placement  DVT prophylaxis: Place and maintain sequential compression device Start: 07/01/20 1332   Code Status: DNR Family Communication: Wife Disposition:   Status is: Inpatient   Dispo: The patient is from: Home              Anticipated d/c is to: snf              Anticipated d/c date is: needs cbi for gross hematuria                Consultants:   Urology  Procedures:   Right ureteral stent placement on 11/23  CBI from 11/25  Antimicrobials:   As above     Objective: Vitals:   07/02/20 0500 07/02/20 1304 07/02/20 2140 07/03/20 0500  BP:  136/86 (!) 142/96 (!) 156/93  Pulse:  73 95 99  Resp:  Temp:  98.6 F (37 C) 97.9 F (36.6 C) 98.7 F (37.1 C)  TempSrc:   Oral Oral  SpO2:  96% 94% 99%  Weight: 94.8 kg   92.9 kg  Height:        Intake/Output Summary (Last 24 hours) at 07/03/2020 1027 Last data filed at 07/03/2020 0814 Gross per  24 hour  Intake 17616 ml  Output 14750 ml  Net 5350 ml   Filed Weights   06/30/20 1805 07/02/20 0500 07/03/20 0500  Weight: 100.3 kg 94.8 kg 92.9 kg    Examination:  General exam: Demented, drowsy, Foley in place, urine is less dark   Respiratory system: Clear to auscultation. Respiratory effort normal. Cardiovascular system: S1 & S2 heard, RRR. No JVD, no murmur, No pedal edema. Gastrointestinal system: Abdomen is nondistended, soft and nontender. Normal bowel sounds heard. Central nervous system: drowsy Extremities: moving all extremities  Skin: No rashes, lesions or ulcers Psychiatry: Demented    Data Reviewed: I have  personally reviewed following labs and imaging studies  CBC: Recent Labs  Lab 06/27/20 0608 06/28/20 0550 07/01/20 0923 07/02/20 1240  WBC 8.2 7.3 11.1* 8.3  NEUTROABS  --   --  8.9* 5.4  HGB 12.9* 13.4 14.3 13.0  HCT 36.2* 38.2* 40.7 36.6*  MCV 93.1 92.7 93.3 92.9  PLT 104* 119* 152 153    Basic Metabolic Panel: Recent Labs  Lab 06/27/20 0608 06/27/20 0608 06/28/20 0550 06/29/20 0540 06/30/20 0543 07/01/20 0923 07/02/20 1240  NA 140   < > 141 137 138 137 141  K 3.2*   < > 3.7 3.6 3.7 4.1 3.6  CL 108   < > 109 106 109 106 109  CO2 23   < > 24 22 20* 21* 26  GLUCOSE 86   < > 89 91 88 103* 92  BUN 24*   < > 22 21 20 17 15   CREATININE 1.65*   < > 1.83* 1.82* 1.65* 1.03 1.02  CALCIUM 8.3*   < > 8.5* 8.4* 8.4* 8.7* 8.6*  MG 2.0  --   --  2.1 2.1 2.1  --   PHOS  --   --   --  2.3* 2.6 2.0*  --    < > = values in this interval not displayed.    GFR: Estimated Creatinine Clearance: 62.6 mL/min (by C-G formula based on SCr of 1.02 mg/dL).  Liver Function Tests: Recent Labs  Lab 06/27/20 0608 06/28/20 0550 06/29/20 0540 06/30/20 0543 07/01/20 0923  AST 43* 33  --   --   --   ALT 33 28  --   --   --   ALKPHOS 69 68  --   --   --   BILITOT 1.0 1.1  --   --   --   PROT 5.2* 5.7*  --   --   --   ALBUMIN 2.8* 2.9* 3.0* 3.1* 3.2*    CBG: Recent Labs  Lab 06/27/20 2012 06/28/20 1455 07/01/20 1155 07/01/20 1704  GLUCAP 90 90 84 102*     Recent Results (from the past 240 hour(s))  Blood Culture (routine x 2)     Status: Abnormal   Collection Time: 06/24/20  6:07 PM   Specimen: BLOOD  Result Value Ref Range Status   Specimen Description   Final    BLOOD LEFT ANTECUBITAL Performed at [facility], [address], [place], [place]    Special Requests   Final    BOTTLES DRAWN AEROBIC AND ANAEROBIC Blood Culture adequate volume Performed at [facility], [address], [place], [place]    Culture   Setup Time   Final    GRAM NEGATIVE RODS IN BOTH AEROBIC AND ANAEROBIC BOTTLES CRITICAL VALUE NOTED.  VALUE IS CONSISTENT WITH PREVIOUSLY REPORTED AND CALLED  VALUE.    Culture (A)  Final    CITROBACTER FREUNDII SUSCEPTIBILITIES PERFORMED ON PREVIOUS CULTURE WITHIN THE LAST 5 DAYS. Performed at [facility], [address], [place], [place] [address]    Report Status 06/27/2020 FINAL  Final  Blood Culture (routine x 2)     Status: Abnormal   Collection Time: 06/24/20  6:12 PM   Specimen: BLOOD  Result Value Ref Range Status   Specimen Description   Final    BLOOD RIGHT ANTECUBITAL Performed at [facility], [address], [place], [place] [address]    Special Requests   Final    BOTTLES DRAWN AEROBIC AND ANAEROBIC Blood Culture adequate volume Performed at [facility], [address] [place], [place] [address]    Culture  Setup Time   Final    IN BOTH AEROBIC AND ANAEROBIC BOTTLES GRAM NEGATIVE RODS CRITICAL RESULT CALLED TO, READ BACK BY AND VERIFIED WITH: Kathrynn Speed [phone] [phone] FCP Performed at [facility], [address], [place], [place] [address]    Culture CITROBACTER FREUNDII (A)  Final   Report Status 06/27/2020 FINAL  Final   Organism ID, Bacteria CITROBACTER FREUNDII  Final      Susceptibility   Citrobacter freundii - MIC*    CEFAZOLIN >=64 RESISTANT Resistant     CEFEPIME <=0.12 SENSITIVE Sensitive     CEFTAZIDIME <=1 SENSITIVE Sensitive     CEFTRIAXONE 8 RESISTANT Resistant     CIPROFLOXACIN <=0.25 SENSITIVE Sensitive     GENTAMICIN >=16 RESISTANT Resistant     IMIPENEM 1 SENSITIVE Sensitive     TRIMETH/SULFA >=320 RESISTANT Resistant     PIP/TAZO <=4 SENSITIVE Sensitive     * CITROBACTER FREUNDII  Blood Culture ID Panel (Reflexed)     Status: Abnormal   Collection Time: 06/24/20  6:12 PM  Result Value Ref Range Status   Enterococcus faecalis NOT DETECTED NOT DETECTED Final   Enterococcus Faecium  NOT DETECTED NOT DETECTED Final   Listeria monocytogenes NOT DETECTED NOT DETECTED Final   Staphylococcus species NOT DETECTED NOT DETECTED Final   Staphylococcus aureus (BCID) NOT DETECTED NOT DETECTED Final   Staphylococcus epidermidis NOT DETECTED NOT DETECTED Final   Staphylococcus lugdunensis NOT DETECTED NOT DETECTED Final   Streptococcus species NOT DETECTED NOT DETECTED Final   Streptococcus agalactiae NOT DETECTED NOT DETECTED Final   Streptococcus pneumoniae NOT DETECTED NOT DETECTED Final   Streptococcus pyogenes NOT DETECTED NOT DETECTED Final   A.calcoaceticus-baumannii NOT DETECTED NOT DETECTED Final   Bacteroides fragilis NOT DETECTED NOT DETECTED Final   Enterobacterales DETECTED (A) NOT DETECTED Final    Comment: Enterobacterales represent a large order of gram negative bacteria, not a single organism. Refer to culture for further identification. CRITICAL RESULT CALLED TO, READ BACK BY AND VERIFIED WITH: PHARMD M MICHAELS [id number] FCP    Enterobacter cloacae complex NOT DETECTED NOT DETECTED Final   Escherichia coli NOT DETECTED NOT DETECTED Final   Klebsiella aerogenes NOT DETECTED NOT DETECTED Final   Klebsiella oxytoca NOT DETECTED NOT DETECTED Final   Klebsiella pneumoniae NOT DETECTED NOT DETECTED Final   Proteus species NOT DETECTED NOT DETECTED Final   Salmonella species NOT DETECTED NOT DETECTED Final   Serratia marcescens NOT DETECTED NOT DETECTED Final   Haemophilus influenzae NOT DETECTED NOT DETECTED Final   Neisseria meningitidis NOT DETECTED NOT DETECTED Final   Pseudomonas aeruginosa NOT DETECTED NOT DETECTED Final   Stenotrophomonas maltophilia NOT DETECTED NOT  DETECTED Final   Candida albicans NOT DETECTED NOT DETECTED Final   Candida auris NOT DETECTED NOT DETECTED Final   Candida glabrata NOT DETECTED NOT DETECTED Final   Candida krusei NOT DETECTED NOT DETECTED Final   Candida parapsilosis NOT DETECTED NOT DETECTED Final   Candida tropicalis  NOT DETECTED NOT DETECTED Final   Cryptococcus neoformans/gattii NOT DETECTED NOT DETECTED Final   CTX-M ESBL NOT DETECTED NOT DETECTED Final   Carbapenem resistance IMP NOT DETECTED NOT DETECTED Final   Carbapenem resistance KPC NOT DETECTED NOT DETECTED Final   Carbapenem resistance NDM NOT DETECTED NOT DETECTED Final   Carbapenem resist OXA 48 LIKE NOT DETECTED NOT DETECTED Final   Carbapenem resistance VIM NOT DETECTED NOT DETECTED Final    Comment: Performed at [facility], [address] [place], [place] [address]  Respiratory Panel by RT PCR (Flu A&B, Covid) - Nasopharyngeal Swab     Status: None   Collection Time: 06/24/20  6:26 PM   Specimen: Nasopharyngeal Swab  Result Value Ref Range Status   SARS Coronavirus 2 by RT PCR NEGATIVE NEGATIVE Final    Comment: (NOTE) SARS-CoV-2 target nucleic acids are NOT DETECTED.  The SARS-CoV-2 RNA is generally detectable in upper respiratoy specimens during the acute phase of infection. The lowest concentration of SARS-CoV-2 viral copies this assay can detect is 131 copies/mL. A negative result does not preclude SARS-Cov-2 infection and should not be used as the sole basis for treatment or other patient management decisions. A negative result may occur with  improper specimen collection/handling, submission of specimen other than nasopharyngeal swab, presence of viral mutation(s) within the areas targeted by this assay, and inadequate number of viral copies (<131 copies/mL). A negative result must be combined with clinical observations, patient history, and epidemiological information. The expected result is Negative.  Fact Sheet for Patients:  https://www.moore.com/  Fact Sheet for Healthcare Providers:  https://www.young.biz/  This test is no t yet approved or cleared by the [place] FDA and  has been authorized for detection and/or diagnosis of SARS-CoV-2 by FDA under an Emergency  Use Authorization (EUA). This EUA will remain  in effect (meaning this test can be used) for the duration of the COVID-19 declaration under Section 564(b)(1) of the Act, 21 U.S.C. section 360bbb-3(b)(1), unless the authorization is terminated or revoked sooner.     Influenza A by PCR NEGATIVE NEGATIVE Final   Influenza B by PCR NEGATIVE NEGATIVE Final    Comment: (NOTE) The Xpert Xpress SARS-CoV-2/FLU/RSV assay is intended as an aid in  the diagnosis of influenza from Nasopharyngeal swab specimens and  should not be used as a sole basis for treatment. Nasal washings and  aspirates are unacceptable for Xpert Xpress SARS-CoV-2/FLU/RSV  testing.  Fact Sheet for Patients: https://www.moore.com/  Fact Sheet for Healthcare Providers: https://www.young.biz/  This test is not yet approved or cleared by the [place] FDA and  has been authorized for detection and/or diagnosis of SARS-CoV-2 by  FDA under an Emergency Use Authorization (EUA). This EUA will remain  in effect (meaning this test can be used) for the duration of the  Covid-19 declaration under Section 564(b)(1) of the Act, 21  U.S.C. section 360bbb-3(b)(1), unless the authorization is  terminated or revoked. Performed at [facility], [address], [place], [place] [address]   Urine culture     Status: Abnormal   Collection Time: 06/24/20  6:28 PM   Specimen: In/Out Cath Urine  Result Value Ref  Range Status   Specimen Description   Final    IN/OUT CATH URINE Performed at [facility], [address] [place], [place] [address]    Special Requests   Final    NONE Performed at [facility], [address], [place], [place] [address]    Culture 70,000 COLONIES/mL CITROBACTER FREUNDII (A)  Final   Report Status 06/26/2020 FINAL  Final   Organism ID, Bacteria CITROBACTER FREUNDII (A)  Final      Susceptibility    Citrobacter freundii - MIC*    CEFAZOLIN >=64 RESISTANT Resistant     CEFEPIME <=0.12 SENSITIVE Sensitive     CEFTRIAXONE <=0.25 SENSITIVE Sensitive     CIPROFLOXACIN <=0.25 SENSITIVE Sensitive     GENTAMICIN >=16 RESISTANT Resistant     IMIPENEM 1 SENSITIVE Sensitive     NITROFURANTOIN <=16 SENSITIVE Sensitive     TRIMETH/SULFA <=20 SENSITIVE Sensitive     PIP/TAZO <=4 SENSITIVE Sensitive     * 70,000 COLONIES/mL CITROBACTER FREUNDII  Surgical pcr screen     Status: None   Collection Time: 06/30/20 12:54 PM   Specimen: Nasal Mucosa; Nasal Swab  Result Value Ref Range Status   MRSA, PCR NEGATIVE NEGATIVE Final   Staphylococcus aureus NEGATIVE NEGATIVE Final    Comment: (NOTE) The Xpert SA Assay (FDA approved for NASAL specimens in patients 74 years of age and older), is one component of a comprehensive surveillance program. It is not intended to diagnose infection nor to guide or monitor treatment. Performed at [facility], [address] [place], [place] [address]   Culture, blood (routine x 2)     Status: None (Preliminary result)   Collection Time: 07/01/20  2:22 PM   Specimen: BLOOD  Result Value Ref Range Status   Specimen Description   Final    BLOOD LEFT ARM Performed at [facility], [address], [place], [place] [address]    Special Requests   Final    BOTTLES DRAWN AEROBIC ONLY Blood Culture results may not be optimal due to an inadequate volume of blood received in culture bottles   Culture   Final    NO GROWTH < 12 HOURS Performed at [facility], [address] [place], [place] [address]    Report Status PENDING  Incomplete  Culture, blood (routine x 2)     Status: None (Preliminary result)   Collection Time: 07/01/20  2:29 PM   Specimen: BLOOD  Result Value Ref Range Status   Specimen Description   Final    BLOOD RIGHT ANTECUBITAL Performed at [facility], [address],  [place], [place] [address]    Special Requests   Final    BOTTLES DRAWN AEROBIC AND ANAEROBIC Blood Culture adequate volume Performed at [facility], [address] [place], [place] [address]    Culture   Final    NO GROWTH < 12 HOURS Performed at [facility], [address], [place], [place] [address]    Report Status PENDING  Incomplete         Radiology Studies: No results found.      Scheduled Meds: . Chlorhexidine Gluconate Cloth  6 each Topical Daily  . finasteride  5 mg Oral Daily  . haloperidol lactate  2 mg Intravenous Once  . QUEtiapine  50 mg Oral QHS  . tamsulosin  0.4 mg Oral QPC supper   Continuous Infusions: . ceFEPime (MAXIPIME)  IV    . sodium chloride irrigation Stopped (07/02/20 1916)     LOS: 9 days   Time spent: 25mins,  Greater than 50% of this time was spent in counseling, explanation of diagnosis, planning of further management, and coordination of care.  I have personally reviewed and interpreted on  07/03/2020 daily labs, I reviewed all nursing notes, pharmacy notes, consultant notes,  vitals, pertinent old records  I have discussed plan of care as described above with RN , patient and family on 07/03/2020  Voice Recognition /Dragon dictation system was used to create this note, attempts have been made to correct errors. Please contact the author with questions and/or clarifications.   Albertine GratesFang Kandi Brusseau, MD PhD FACP [facility]  Available via Epic secure chat 7am-7pm for nonurgent issues Please page for urgent issues To page the attending provider between 7A-7P or the covering provider during after hours 7P-7A, please log into the web site [email or web address] and access using universal [place] password for that web site. If you do not have the password, please call the hospital operator.    07/03/2020, 10:27 AM

## 2020-07-03 NOTE — Plan of Care (Signed)
  Problem: Health Behavior/Discharge Planning: Goal: Ability to manage health-related needs will improve Outcome: Not Progressing   Problem: Clinical Measurements: Goal: Ability to maintain clinical measurements within normal limits will improve Outcome: Not Progressing   Problem: Nutrition: Goal: Adequate nutrition will be maintained Outcome: Not Progressing   Problem: Coping: Goal: Level of anxiety will decrease Outcome: Not Progressing   Problem: Pain Managment: Goal: General experience of comfort will improve Outcome: Not Progressing   Problem: Safety: Goal: Ability to remain free from injury will improve Outcome: Not Progressing

## 2020-07-03 NOTE — Progress Notes (Signed)
Physical Therapy Treatment Patient Details Name: William Bishop MRN: [id number] DOB: 1940-12-21 Today's Date: 07/03/2020    History of Present Illness Pt is 79 yo male with PMH of significant dementia, urinary retention, and recent constipation. Pt presented with increased confusion and fevers.  Pt was found to be septic with UTI.  Pt with Gross hematuria and urinary retention and now has foley catheter.    PT Comments    Pt more resistant to mobilizing today however spouse present and helped with encouraging pt.  Pt requiring more assist to stand and unable to remain standing long enough to attempt ambulating today.  Pt's cognitive status also worse this session compared to last session.  Spouse reports inability to care for pt in current condition and agreeable to SNF upon d/c.   Follow Up Recommendations  SNF;Supervision/Assistance - 24 hour     Equipment Recommendations  None recommended by PT    Recommendations for Other Services       Precautions / Restrictions Precautions Precautions: Fall    Mobility  Bed Mobility Overal bed mobility: Needs Assistance Bed Mobility: Supine to Sit;Sit to Supine     Supine to sit: Max assist;+2 for physical assistance Sit to supine: Max assist;+2 for physical assistance   General bed mobility comments: multimodal cues including manual cues for bed mobility, pt resistant at times, spouse present and encouraging  Transfers Overall transfer level: Needs assistance Equipment used: Rolling walker (2 wheeled) Transfers: Sit to/from Stand Sit to Stand: Max assist;Mod assist;+2 physical assistance         General transfer comment: pt held RW however requiring at least mod assist for weakness and cueing, performed twice, posterior lean upon rise, improved with second sit to stand and able to side step a couple feet up HOB; pt resistant to remain standing and returned to sitting  Ambulation/Gait             General Gait Details:  unable today   Stairs             Wheelchair Mobility    Modified Rankin (Stroke Patients Only)       Balance                                            Cognition Arousal/Alertness: Awake/alert Behavior During Therapy: Restless Overall Cognitive Status: Impaired/Different from baseline Area of Impairment: Orientation;Memory;Following commands;Safety/judgement;Awareness;Problem solving                 Orientation Level: Disoriented to;Person;Place;Time;Situation   Memory: Decreased short-term memory Following Commands: Follows one step commands inconsistently Safety/Judgement: Decreased awareness of deficits;Decreased awareness of safety Awareness: Intellectual Problem Solving: Difficulty sequencing;Requires verbal cues;Requires tactile cues General Comments: hx dementia, pt more confused and with decreased cognition compared to previous session      Exercises      General Comments        Pertinent Vitals/Pain Pain Assessment: Faces Faces Pain Scale: Hurts a little bit Pain Location: not able to state pain location Pain Descriptors / Indicators: Grimacing Pain Intervention(s): Repositioned;Monitored during session    Home Living                      Prior Function            PT Goals (current goals can now be found in the care plan section) Progress  towards PT goals: Progressing toward goals    Frequency    Min 2X/week      PT Plan Discharge plan needs to be updated    Co-evaluation              AM-PAC PT "6 Clicks" Mobility   Outcome Measure  Help needed turning from your back to your side while in a flat bed without using bedrails?: A Lot Help needed moving from lying on your back to sitting on the side of a flat bed without using bedrails?: A Lot Help needed moving to and from a bed to a chair (including a wheelchair)?: A Lot Help needed standing up from a chair using your arms (e.g., wheelchair  or bedside chair)?: A Lot Help needed to walk in hospital room?: A Lot Help needed climbing 3-5 steps with a railing? : Total 6 Click Score: 11    End of Session Equipment Utilized During Treatment: Gait belt Activity Tolerance: Patient tolerated treatment well Patient left: with bed alarm set;in bed;with family/visitor present;with call bell/phone within reach Nurse Communication: Mobility status PT Visit Diagnosis: Unsteadiness on feet (R26.81)     Time: 5009-3818 PT Time Calculation (min) (ACUTE ONLY): 17 min  Charges:  $Therapeutic Activity: 8-22 mins                    Thomasene Mohair PT, DPT Acute Rehabilitation Services Pager: [phone] Office: [phone]  Maida Sale E 07/03/2020, 3:49 PM

## 2020-07-03 NOTE — Progress Notes (Signed)
Patient continues to moan, groan, pull at catheter and penis and yell it hurts.He also stands and tries to pee while the cath is in place. I was able to start the CBI around 1am due to administering the pain medicine and Ativan together (that is the only thing that work for the treatment). I manage to get five bags and his urine is clear at this time. If the pain medication and the Ativan can be schedule together that is the only solution for continuing the CBI. Wife called and voice concerns about her not being able to take care of him at home with the catheter in place.

## 2020-07-04 DIAGNOSIS — A419 Sepsis, unspecified organism: Secondary | ICD-10-CM | POA: Diagnosis not present

## 2020-07-04 DIAGNOSIS — N390 Urinary tract infection, site not specified: Secondary | ICD-10-CM | POA: Diagnosis not present

## 2020-07-04 NOTE — Progress Notes (Signed)
PROGRESS NOTE    ROBB SIBAL  AJG:[id number] DOB: 1941-03-15 DOA: 06/24/2020 PCP: [name], MD    Chief Complaint  Patient presents with  . Code Sepsis  . Altered Mental Status    Brief Narrative:  79 year old male with history of severe dementia, BPH, urinary retention s/p indwelling Foley that was recently removed presenting with increased confusion and fever, and admitted for severe sepsis due to Citrobacter freundii UTI and bacteremia.  Started on cefepime on 06/24/2020.  Indwelling Foley catheter reinserted due to urinary retention.  Subjective:   Per RN patient  Seems to have less pain, less agitated Urine is less bloody in foley  He was restless received iv ativan at 7:47 this amd and iv dilaudid last night, this am he is drowsy   Wife at bedside     Assessment & Plan:   Principal Problem:   Sepsis secondary to UTI [facility]) Active Problems:   Acute lower UTI   Sepsis (HCC)   Sepsis due to Citrobacter bacteremia and UTI present on admission Right ureteral stone status post stent placement on November 23 Continue IV antibiotic cefepime, blood culture no growth -Case discussed with infectious disease who recommend starting date of antibiotic count as November 23 for total of 7 days of treatment, plan to continue cefepime in the hospital, transition to Cipro to finish treatment at discharge  Catheter associated UTI -Foley removed prior to admission but reinserted due to urinary retention/BPH - -Continue Flomax  Gross hematurai -catheter clogged up by blood clots needs be flushed multiple time,s urine now gross hematuria, case discussed with urology Dr [name] who recommend change foley to 3way 89fr and CBI until urine clears, then can keep it in at discharge -hold lovenox, and scd's due to blood noticed around catheter, monitor H&H  AKI on CKD 2 -Appear resolving, continue monitor renal function -DC IV hydration  Hypertension Home blood pressure  Imdur held due to sepsis, likely able to resume tomorrow  Dementia With acute metabolic encephalopathy on presentation, wife state mental status has improved but in the last two days he has not been himself, wife reports patient at baseline recognize her intermittently, this morning patient recognized her Resume nightly Seroquel Delirium protocol   He is very deconditioned and confused, needs a lot of care , wife not able to manage foley at home, he needs snf placement  DVT prophylaxis: Place and maintain sequential compression device Start: 07/01/20 1332   Code Status: DNR Family Communication: Wife Disposition:   Status is: Inpatient   Dispo: The patient is from: Home              Anticipated d/c is to: snf              Anticipated d/c date is: Medically ready to discharge to skilled nursing facility, awaiting for a bed                Consultants:   Urology  Procedures:   Right ureteral stent placement on 11/23  CBI from 11/25  Antimicrobials:   As above     Objective: Vitals:   07/03/20 0500 07/03/20 1218 07/03/20 2125 07/04/20 1341  BP: (!) 156/93 119/88 (!) 135/98 (!) 140/110  Pulse: 99 98 (!) 105 (!) 110  Resp: 18 18 20 15   Temp: 98.7 F (37.1 C) 98.2 F (36.8 C) 98.2 F (36.8 C) 98.8 F (37.1 C)  TempSrc: Oral Axillary Axillary Axillary  SpO2: 99% 98% 96% 97%  Weight: 92.9  kg     Height:        Intake/Output Summary (Last 24 hours) at 07/04/2020 1501 Last data filed at 07/04/2020 1300 Gross per 24 hour  Intake 100 ml  Output 1425 ml  Net -1325 ml   Filed Weights   06/30/20 1805 07/02/20 0500 07/03/20 0500  Weight: 100.3 kg 94.8 kg 92.9 kg    Examination:  General exam: Demented, drowsy, Foley in place, urine is less dark   Respiratory system: Clear to auscultation. Respiratory effort normal. Cardiovascular system: S1 & S2 heard, RRR. No JVD, no murmur, No pedal edema. Gastrointestinal system: Abdomen is nondistended, soft and nontender.  Normal bowel sounds heard. Central nervous system: drowsy Extremities: moving all extremities  Skin: No rashes, lesions or ulcers Psychiatry: Demented    Data Reviewed: I have personally reviewed following labs and imaging studies  CBC: Recent Labs  Lab 06/28/20 0550 07/01/20 0923 07/02/20 1240 07/03/20 1307  WBC 7.3 11.1* 8.3 7.6  NEUTROABS  --  8.9* 5.4 4.9  HGB 13.4 14.3 13.0 13.8  HCT 38.2* 40.7 36.6* 39.5  MCV 92.7 93.3 92.9 93.2  PLT 119* 152 153 196    Basic Metabolic Panel: Recent Labs  Lab 06/29/20 0540 06/30/20 0543 07/01/20 0923 07/02/20 1240 07/03/20 1307  NA 137 138 137 141 141  K 3.6 3.7 4.1 3.6 3.7  CL 106 109 106 109 107  CO2 22 20* 21* 26 24  GLUCOSE 91 88 103* 92 95  BUN CREATININE 1.82* 1.65* 1.03 1.02 1.05  CALCIUM 8.4* 8.4* 8.7* 8.6* 9.0  MG 2.1 2.1 2.1  --   --   PHOS 2.3* 2.6 2.0*  --   --     GFR: Estimated Creatinine Clearance: 60.8 mL/min (by C-G formula based on SCr of 1.05 mg/dL).  Liver Function Tests: Recent Labs  Lab 06/28/20 0550 06/29/20 0540 06/30/20 0543 07/01/20 0923  AST 33  --   --   --   ALT 28  --   --   --   ALKPHOS 68  --   --   --   BILITOT 1.1  --   --   --   PROT 5.7*  --   --   --   ALBUMIN 2.9* 3.0* 3.1* 3.2*    CBG: Recent Labs  Lab 06/27/20 2012 06/28/20 1455 07/01/20 1155 07/01/20 1704  GLUCAP 90 90 84 102*     Recent Results (from the past 240 hour(s))  Blood Culture (routine x 2)     Status: Abnormal   Collection Time: 06/24/20  6:07 PM   Specimen: BLOOD  Result Value Ref Range Status   Specimen Description   Final    BLOOD LEFT ANTECUBITAL Performed at [facility], [address] [place], [place] [address]    Special Requests   Final    BOTTLES DRAWN AEROBIC AND ANAEROBIC Blood Culture adequate volume Performed at [facility], [address] [place], [place] [address]    Culture  Setup Time   Final    GRAM NEGATIVE  RODS IN BOTH AEROBIC AND ANAEROBIC BOTTLES CRITICAL VALUE NOTED.  VALUE IS CONSISTENT WITH PREVIOUSLY REPORTED AND CALLED VALUE.    Culture (A)  Final    CITROBACTER FREUNDII SUSCEPTIBILITIES PERFORMED ON PREVIOUS CULTURE WITHIN THE LAST 5 DAYS. Performed at [facility], [address], [place], [place] [address]    Report Status 06/27/2020 FINAL  Final  Blood Culture (routine x 2)     Status: Abnormal   Collection Time: 06/24/20  6:12 PM   Specimen: BLOOD  Result Value Ref Range Status   Specimen Description   Final    BLOOD RIGHT ANTECUBITAL Performed at [facility], [address] [place], [place] [id number]    Special Requests   Final    BOTTLES DRAWN AEROBIC AND ANAEROBIC Blood Culture adequate volume Performed at [facility], [address] [place], [place] [address]    Culture  Setup Time   Final    IN BOTH AEROBIC AND ANAEROBIC BOTTLES GRAM NEGATIVE RODS CRITICAL RESULT CALLED TO, READ BACK BY AND VERIFIED WITH: Kathrynn SpeedHARMD M MICHAELS [id number] FCP Performed at [facility], [address] [place], [place] [address]    Culture CITROBACTER FREUNDII (A)  Final   Report Status 06/27/2020 FINAL  Final   Organism ID, Bacteria CITROBACTER FREUNDII  Final      Susceptibility   Citrobacter freundii - MIC*    CEFAZOLIN >=64 RESISTANT Resistant     CEFEPIME <=0.12 SENSITIVE Sensitive     CEFTAZIDIME <=1 SENSITIVE Sensitive     CEFTRIAXONE 8 RESISTANT Resistant     CIPROFLOXACIN <=0.25 SENSITIVE Sensitive     GENTAMICIN >=16 RESISTANT Resistant     IMIPENEM 1 SENSITIVE Sensitive     TRIMETH/SULFA >=320 RESISTANT Resistant     PIP/TAZO <=4 SENSITIVE Sensitive     * CITROBACTER FREUNDII  Blood Culture ID Panel (Reflexed)     Status: Abnormal   Collection Time: 06/24/20  6:12 PM  Result Value Ref Range Status   Enterococcus faecalis NOT DETECTED NOT DETECTED Final   Enterococcus Faecium NOT DETECTED NOT DETECTED Final    Listeria monocytogenes NOT DETECTED NOT DETECTED Final   Staphylococcus species NOT DETECTED NOT DETECTED Final   Staphylococcus aureus (BCID) NOT DETECTED NOT DETECTED Final   Staphylococcus epidermidis NOT DETECTED NOT DETECTED Final   Staphylococcus lugdunensis NOT DETECTED NOT DETECTED Final   Streptococcus species NOT DETECTED NOT DETECTED Final   Streptococcus agalactiae NOT DETECTED NOT DETECTED Final   Streptococcus pneumoniae NOT DETECTED NOT DETECTED Final   Streptococcus pyogenes NOT DETECTED NOT DETECTED Final   A.calcoaceticus-baumannii NOT DETECTED NOT DETECTED Final   Bacteroides fragilis NOT DETECTED NOT DETECTED Final   Enterobacterales DETECTED (A) NOT DETECTED Final    Comment: Enterobacterales represent a large order of gram negative bacteria, not a single organism. Refer to culture for further identification. CRITICAL RESULT CALLED TO, READ BACK BY AND VERIFIED WITH: PHARMD M MICHAELS [id number] FCP    Enterobacter cloacae complex NOT DETECTED NOT DETECTED Final   Escherichia coli NOT DETECTED NOT DETECTED Final   Klebsiella aerogenes NOT DETECTED NOT DETECTED Final   Klebsiella oxytoca NOT DETECTED NOT DETECTED Final   Klebsiella pneumoniae NOT DETECTED NOT DETECTED Final   Proteus species NOT DETECTED NOT DETECTED Final   Salmonella species NOT DETECTED NOT DETECTED Final   Serratia marcescens NOT DETECTED NOT DETECTED Final   Haemophilus influenzae NOT DETECTED NOT DETECTED Final   Neisseria meningitidis NOT DETECTED NOT DETECTED Final   Pseudomonas aeruginosa NOT DETECTED NOT DETECTED Final   Stenotrophomonas maltophilia NOT DETECTED NOT DETECTED Final   Candida albicans NOT DETECTED NOT DETECTED Final   Candida auris NOT DETECTED NOT DETECTED Final   Candida glabrata NOT DETECTED NOT DETECTED Final   Candida krusei NOT DETECTED NOT DETECTED Final   Candida parapsilosis NOT DETECTED NOT DETECTED  Final   Candida tropicalis NOT DETECTED NOT DETECTED Final    Cryptococcus neoformans/gattii NOT DETECTED NOT DETECTED Final   CTX-M ESBL NOT DETECTED NOT DETECTED Final   Carbapenem resistance IMP NOT DETECTED NOT DETECTED Final   Carbapenem resistance KPC NOT DETECTED NOT DETECTED Final   Carbapenem resistance NDM NOT DETECTED NOT DETECTED Final   Carbapenem resist OXA 48 LIKE NOT DETECTED NOT DETECTED Final   Carbapenem resistance VIM NOT DETECTED NOT DETECTED Final    Comment: Performed at [facility], [address] [place], [place] [address]  Respiratory Panel by RT PCR (Flu A&B, Covid) - Nasopharyngeal Swab     Status: None   Collection Time: 06/24/20  6:26 PM   Specimen: Nasopharyngeal Swab  Result Value Ref Range Status   SARS Coronavirus 2 by RT PCR NEGATIVE NEGATIVE Final    Comment: (NOTE) SARS-CoV-2 target nucleic acids are NOT DETECTED.  The SARS-CoV-2 RNA is generally detectable in upper respiratoy specimens during the acute phase of infection. The lowest concentration of SARS-CoV-2 viral copies this assay can detect is 131 copies/mL. A negative result does not preclude SARS-Cov-2 infection and should not be used as the sole basis for treatment or other patient management decisions. A negative result may occur with  improper specimen collection/handling, submission of specimen other than nasopharyngeal swab, presence of viral mutation(s) within the areas targeted by this assay, and inadequate number of viral copies (<131 copies/mL). A negative result must be combined with clinical observations, patient history, and epidemiological information. The expected result is Negative.  Fact Sheet for Patients:  https://www.moore.com/  Fact Sheet for Healthcare Providers:  https://www.young.biz/  This test is no t yet approved or cleared by the [place] FDA and  has been authorized for detection and/or diagnosis of SARS-CoV-2 by FDA under an Emergency Use Authorization (EUA). This EUA  will remain  in effect (meaning this test can be used) for the duration of the COVID-19 declaration under Section 564(b)(1) of the Act, 21 U.S.C. section 360bbb-3(b)(1), unless the authorization is terminated or revoked sooner.     Influenza A by PCR NEGATIVE NEGATIVE Final   Influenza B by PCR NEGATIVE NEGATIVE Final    Comment: (NOTE) The Xpert Xpress SARS-CoV-2/FLU/RSV assay is intended as an aid in  the diagnosis of influenza from Nasopharyngeal swab specimens and  should not be used as a sole basis for treatment. Nasal washings and  aspirates are unacceptable for Xpert Xpress SARS-CoV-2/FLU/RSV  testing.  Fact Sheet for Patients: https://www.moore.com/  Fact Sheet for Healthcare Providers: https://www.young.biz/  This test is not yet approved or cleared by the [place] FDA and  has been authorized for detection and/or diagnosis of SARS-CoV-2 by  FDA under an Emergency Use Authorization (EUA). This EUA will remain  in effect (meaning this test can be used) for the duration of the  Covid-19 declaration under Section 564(b)(1) of the Act, 21  U.S.C. section 360bbb-3(b)(1), unless the authorization is  terminated or revoked. Performed at [facility], [address] [place], [place] [address]   Urine culture     Status: Abnormal   Collection Time: 06/24/20  6:28 PM   Specimen: In/Out Cath Urine  Result Value Ref Range Status   Specimen Description   Final    IN/OUT CATH URINE Performed at [facility], [address], [place], [place] [address]    Special Requests   Final    NONE Performed at [facility],  [address] [place], [place] [address]    Culture 70,000 COLONIES/mL CITROBACTER FREUNDII (A)  Final   Report Status 06/26/2020 FINAL  Final   Organism ID, Bacteria CITROBACTER FREUNDII (A)  Final      Susceptibility   Citrobacter freundii - MIC*    CEFAZOLIN  >=64 RESISTANT Resistant     CEFEPIME <=0.12 SENSITIVE Sensitive     CEFTRIAXONE <=0.25 SENSITIVE Sensitive     CIPROFLOXACIN <=0.25 SENSITIVE Sensitive     GENTAMICIN >=16 RESISTANT Resistant     IMIPENEM 1 SENSITIVE Sensitive     NITROFURANTOIN <=16 SENSITIVE Sensitive     TRIMETH/SULFA <=20 SENSITIVE Sensitive     PIP/TAZO <=4 SENSITIVE Sensitive     * 70,000 COLONIES/mL CITROBACTER FREUNDII  Surgical pcr screen     Status: None   Collection Time: 06/30/20 12:54 PM   Specimen: Nasal Mucosa; Nasal Swab  Result Value Ref Range Status   MRSA, PCR NEGATIVE NEGATIVE Final   Staphylococcus aureus NEGATIVE NEGATIVE Final    Comment: (NOTE) The Xpert SA Assay (FDA approved for NASAL specimens in patients 54 years of age and older), is one component of a comprehensive surveillance program. It is not intended to diagnose infection nor to guide or monitor treatment. Performed at [facility], [address] [place] [address], [place], [place] [address]   Culture, blood (routine x 2)     Status: None (Preliminary result)   Collection Time: 07/01/20  2:22 PM   Specimen: BLOOD  Result Value Ref Range Status   Specimen Description   Final    BLOOD LEFT ARM Performed at [facility], [address] [place], [place] [address]    Special Requests   Final    BOTTLES DRAWN AEROBIC ONLY Blood Culture results may not be optimal due to an inadequate volume of blood received in culture bottles   Culture   Final    NO GROWTH 3 DAYS Performed at [facility], [address], [place], [place] [address]    Report Status PENDING  Incomplete  Culture, blood (routine x 2)     Status: None (Preliminary result)   Collection Time: 07/01/20  2:29 PM   Specimen: BLOOD  Result Value Ref Range Status   Specimen Description   Final    BLOOD RIGHT ANTECUBITAL Performed at [facility], [address] [place], [place] [address]    Special Requests   Final     BOTTLES DRAWN AEROBIC AND ANAEROBIC Blood Culture adequate volume Performed at [facility] [address] [place], [place] [address]    Culture   Final    NO GROWTH 3 DAYS Performed at [facility], [address], [place], [place] [address]    Report Status PENDING  Incomplete         Radiology Studies: No results found.      Scheduled Meds: . Chlorhexidine Gluconate Cloth  6 each Topical Daily  . finasteride  5 mg Oral Daily  . haloperidol lactate  2 mg Intravenous Once  . QUEtiapine  50 mg Oral QHS  . tamsulosin  0.4 mg Oral QPC supper   Continuous Infusions: . ceFEPime (MAXIPIME) IV 2 g (07/04/20 1326)  . sodium chloride irrigation Stopped (07/02/20 1916)     LOS: 10 days   Time spent: ,  Greater than 50% of this time was spent in counseling, explanation of diagnosis, planning of further management, and coordination of care.  I have personally reviewed and interpreted on  07/04/2020 daily labs, I reviewed all nursing notes, pharmacy notes, consultant notes,  vitals, pertinent old records  I have discussed plan of care as described above with RN , patient and family on 07/04/2020  Voice Recognition /Dragon dictation system was used to create this note, attempts have been made to correct errors. Please contact the author with questions and/or clarifications.   Albertine Grates, MD PhD FACP [facility]  Available via Epic secure chat 7am-7pm for nonurgent issues Please page for urgent issues To page the attending provider between 7A-7P or the covering provider during after hours 7P-7A, please log into the web site [email or web address] and access using universal [place] password for that web site. If you do not have the password, please call the hospital operator.    07/04/2020, 3:01 PM

## 2020-07-05 DIAGNOSIS — N390 Urinary tract infection, site not specified: Secondary | ICD-10-CM | POA: Diagnosis not present

## 2020-07-05 DIAGNOSIS — A419 Sepsis, unspecified organism: Secondary | ICD-10-CM | POA: Diagnosis not present

## 2020-07-05 LAB — CBC WITH DIFFERENTIAL/PLATELET
Abs Immature Granulocytes: 0.04 10*3/uL (ref 0.00–0.07)
Basophils Absolute: 0.1 10*3/uL (ref 0.0–0.1)
Basophils Relative: 1 %
Eosinophils Absolute: 0.3 10*3/uL (ref 0.0–0.5)
Eosinophils Relative: 3 %
HCT: 38.9 % — ABNORMAL LOW (ref 39.0–52.0)
Hemoglobin: 14 g/dL (ref 13.0–17.0)
Immature Granulocytes: 0 %
Lymphocytes Relative: 17 %
Lymphs Abs: 1.9 10*3/uL (ref 0.7–4.0)
MCH: 33 pg (ref 26.0–34.0)
MCHC: 36 g/dL (ref 30.0–36.0)
MCV: 91.7 fL (ref 80.0–100.0)
Monocytes Absolute: 0.7 10*3/uL (ref 0.1–1.0)
Monocytes Relative: 6 %
Neutro Abs: 8 10*3/uL — ABNORMAL HIGH (ref 1.7–7.7)
Neutrophils Relative %: 73 %
Platelets: 212 10*3/uL (ref 150–400)
RBC: 4.24 MIL/uL (ref 4.22–5.81)
RDW: 14 % (ref 11.5–15.5)
WBC: 10.9 10*3/uL — ABNORMAL HIGH (ref 4.0–10.5)
nRBC: 0 % (ref 0.0–0.2)

## 2020-07-05 LAB — BASIC METABOLIC PANEL
Anion gap: 12 (ref 5–15)
BUN: 16 mg/dL (ref 8–23)
CO2: 23 mmol/L (ref 22–32)
Calcium: 9.2 mg/dL (ref 8.9–10.3)
Chloride: 110 mmol/L (ref 98–111)
Creatinine, Ser: 1.09 mg/dL (ref 0.61–1.24)
GFR, Estimated: >60 mL/min (ref >60)
Glucose, Bld: 103 mg/dL — ABNORMAL HIGH (ref 70–99)
Potassium: 3.3 mmol/L — ABNORMAL LOW (ref 3.5–5.1)
Sodium: 145 mmol/L (ref 135–145)

## 2020-07-05 LAB — MAGNESIUM: Magnesium: 2.1 mg/dL (ref 1.7–2.4)

## 2020-07-05 LAB — PHOSPHORUS: Phosphorus: 2.5 mg/dL (ref 2.5–4.6)

## 2020-07-05 MED ORDER — LORAZEPAM 2 MG/ML IJ SOLN
0.5000 mg | Freq: Once | INTRAMUSCULAR | Status: AC
  Administered 2020-07-05: 0.5 mg via INTRAVENOUS

## 2020-07-05 MED ORDER — SODIUM CHLORIDE 0.9 % IV SOLN: INTRAVENOUS | Status: AC

## 2020-07-05 MED ORDER — ENOXAPARIN SODIUM 40 MG/0.4ML ~~LOC~~ SOLN
40.0000 mg | SUBCUTANEOUS | Status: DC
  Administered 2020-07-05 – 2020-07-06 (×2): 40 mg via SUBCUTANEOUS
  Filled 2020-07-05 (×2): qty 0.4

## 2020-07-05 MED ORDER — POTASSIUM CHLORIDE 10 MEQ/100ML IV SOLN
10.0000 meq | INTRAVENOUS | Status: AC
  Administered 2020-07-05 (×6): 10 meq via INTRAVENOUS
  Filled 2020-07-05 (×5): qty 100

## 2020-07-05 NOTE — Progress Notes (Signed)
PROGRESS NOTE    KENNET MCCORT  DXI:[id number] DOB: 08-02-1941 DOA: 06/24/2020 PCP: [name], MD    Chief Complaint  Patient presents with  . Code Sepsis  . Altered Mental Status    Brief Narrative:  79 year old male with history of severe dementia, BPH, urinary retention s/p indwelling Foley that was recently removed presenting with increased confusion and fever, and admitted for severe sepsis due to Citrobacter freundii UTI and bacteremia.  Started on cefepime on 06/24/2020.  Indwelling Foley catheter reinserted due to urinary retention.  Subjective:  Gross hematuria seems resolving, urine still dark, a few blood clot in the Foley bag  He was restless received iv ativan  and iv dilaudid last night, this am he is drowsy  He has poor oral intake  Wife at bedside     Assessment & Plan:   Principal Problem:   Sepsis secondary to UTI [facility]) Active Problems:   Acute lower UTI   Sepsis (HCC)   Sepsis due to Citrobacter bacteremia and UTI present on admission Right ureteral stone status post stent placement on November 23 Continue IV antibiotic cefepime, blood culture no growth -Case discussed with infectious disease who recommend  antibiotic starting date from November 23 for total of 7 days of treatment, plan to continue cefepime in the hospital, transition to Cipro to finish treatment at discharge  Catheter associated UTI -Foley removed prior to admission but reinserted due to urinary retention/BPH - -Continue Flomax  Gross hematuria -33fr catheter clogged up by blood clots needs be flushed multiple times, then developed gross hematuria, -case discussed with urology Dr [name] who recommend change foley to 3way 39fr and CBI until urine clears, then can keep foley in at discharge - lovenox held due to hematuria,  On scd's  -H&H stable, hematuria resolving, resume lovenox subcu  AKI on CKD 2 -resolved, continue monitor renal function - IV hydration  discontinued but restarted due to poor oral intake  Hypokalemia  replace k  Hypertension Home blood pressure Imdur held due to sepsis, blood pressure stable without medication, continue monitor, resume blood pressure medication when indicated  Dementia With acute metabolic encephalopathy on presentation, wife state mental status has improved but in the last two days he has not been himself, wife reports patient at baseline recognize her intermittently, this morning patient recognized her Resume nightly Seroquel Delirium protocol   He is very deconditioned and confused, needs a lot of care , wife not able to manage foley at home, he needs snf placement Wife desires to know long-term prognosis, she would like to talk to palliative care, wife also agrees that palliative care continue to follow patient after discharge  DVT prophylaxis: Place and maintain sequential compression device Start: 07/01/20 1332   Code Status: DNR Family Communication: Wife at bedside daily Disposition:   Status is: Inpatient   Dispo: The patient is from: Home              Anticipated d/c is to: snf with palliative care              Anticipated d/c date is: Medically ready to discharge to skilled nursing facility, awaiting for a bed                Consultants:   Urology  Palliative care  Procedures:   Right ureteral stent placement on 11/23  CBI   Antimicrobials:   As above     Objective: Vitals:   07/03/20 2125 07/04/20 1341 07/04/20 2115  07/05/20 1020  BP: (!) 135/98 (!) 140/110 (!) 129/96 130/74  Pulse: (!) 105 (!) 110 (!) 110 86  Resp: 20 15 17  (!) 22  Temp:  98.8 F (37.1 C) 98.1 F (36.7 C) 99.1 F (37.3 C)  TempSrc: Axillary Axillary Oral   SpO2: 96% 97% 94% 92%  Weight:      Height:        Intake/Output Summary (Last 24 hours) at 07/05/2020 1032 Last data filed at 07/05/2020 1015 Gross per 24 hour  Intake 0 ml  Output 1500 ml  Net -1500 ml   Filed Weights    06/30/20 1805 07/02/20 0500 07/03/20 0500  Weight: 100.3 kg 94.8 kg 92.9 kg    Examination:  General exam: Demented, drowsy, Foley in place, urine is less dark   Respiratory system: Clear to auscultation. Respiratory effort normal. Cardiovascular system: S1 & S2 heard, RRR. No JVD, no murmur, No pedal edema. Gastrointestinal system: Abdomen is nondistended, soft and nontender. Normal bowel sounds heard. Central nervous system: drowsy Extremities: moving all extremities  Skin: No rashes, lesions or ulcers Psychiatry: Demented    Data Reviewed: I have personally reviewed following labs and imaging studies  CBC: Recent Labs  Lab 07/01/20 0923 07/02/20 1240 07/03/20 1307 07/05/20 0700  WBC 11.1* 8.3 7.6 10.9*  NEUTROABS 8.9* 5.4 4.9 8.0*  HGB 14.3 13.0 13.8 14.0  HCT 40.7 36.6* 39.5 38.9*  MCV 93.3 92.9 93.2 91.7  PLT 152 153 196 212    Basic Metabolic Panel: Recent Labs  Lab 06/29/20 0540 06/29/20 0540 06/30/20 0543 07/01/20 0923 07/02/20 1240 07/03/20 1307 07/05/20 0700  NA 137   < > 138 137 141 141 145  K 3.6   < > 3.7 4.1 3.6 3.7 3.3*  CL 106   < > 109 106 109 107 110  CO2 22   < > 20* 21* 26 24 23   GLUCOSE 91   < > 88 103* 92 95 103*  BUN 21   < > 20 17 15 13 16   CREATININE 1.82*   < > 1.65* 1.03 1.02 1.05 1.09  CALCIUM 8.4*   < > 8.4* 8.7* 8.6* 9.0 9.2  MG 2.1  --  2.1 2.1  --   --  2.1  PHOS 2.3*  --  2.6 2.0*  --   --  2.5   < > = values in this interval not displayed.    GFR: Estimated Creatinine Clearance: 58.6 mL/min (by C-G formula based on SCr of 1.09 mg/dL).  Liver Function Tests: Recent Labs  Lab 06/29/20 0540 06/30/20 0543 07/01/20 0923  ALBUMIN 3.0* 3.1* 3.2*    CBG: Recent Labs  Lab 06/28/20 1455 07/01/20 1155 07/01/20 1704  GLUCAP 90 84 102*     Recent Results (from the past 240 hour(s))  Surgical pcr screen     Status: None   Collection Time: 06/30/20 12:54 PM   Specimen: Nasal Mucosa; Nasal Swab  Result Value Ref  Range Status   MRSA, PCR NEGATIVE NEGATIVE Final   Staphylococcus aureus NEGATIVE NEGATIVE Final    Comment: (NOTE) The Xpert SA Assay (FDA approved for NASAL specimens in patients 32 years of age and older), is one component of a comprehensive surveillance program. It is not intended to diagnose infection nor to guide or monitor treatment. Performed at [facility], [address] [place], [place]   Culture, blood (routine x 2)     Status: None (Preliminary result)   Collection  Time: 07/01/20  2:22 PM   Specimen: BLOOD  Result Value Ref Range Status   Specimen Description   Final    BLOOD LEFT ARM Performed at [facility], [address], [place], [place] [address]    Special Requests   Final    BOTTLES DRAWN AEROBIC ONLY Blood Culture results may not be optimal due to an inadequate volume of blood received in culture bottles   Culture   Final    NO GROWTH 4 DAYS Performed at [facility], [address] [place], [place] [address]    Report Status PENDING  Incomplete  Culture, blood (routine x 2)     Status: None (Preliminary result)   Collection Time: 07/01/20  2:29 PM   Specimen: BLOOD  Result Value Ref Range Status   Specimen Description   Final    BLOOD RIGHT ANTECUBITAL Performed at [facility], [address] [place], [place] [address]    Special Requests   Final    BOTTLES DRAWN AEROBIC AND ANAEROBIC Blood Culture adequate volume Performed at [facility], [address], [place], [place] [address]    Culture   Final    NO GROWTH 4 DAYS Performed at [facility], [address] [place], [place] [address]    Report Status PENDING  Incomplete         Radiology Studies: No results found.      Scheduled Meds: . Chlorhexidine Gluconate Cloth  6 each Topical Daily  . finasteride  5 mg Oral Daily  . haloperidol lactate  2 mg Intravenous Once  .  QUEtiapine  50 mg Oral QHS  . tamsulosin  0.4 mg Oral QPC supper   Continuous Infusions: . sodium chloride    . ceFEPime (MAXIPIME) IV 2 g (07/05/20 0305)  . potassium chloride    . sodium chloride irrigation Stopped (07/02/20 1916)     LOS: 11 days   Time spent: ,  Greater than 50% of this time was spent in counseling, explanation of diagnosis, planning of further management, and coordination of care.  I have personally reviewed and interpreted on  07/05/2020 daily labs, I reviewed all nursing notes, pharmacy notes, consultant notes,  vitals, pertinent old records  I have discussed plan of care as described above with RN , patient and family on 07/05/2020  Voice Recognition /Dragon dictation system was used to create this note, attempts have been made to correct errors. Please contact the author with questions and/or clarifications.   Albertine Grates, MD PhD FACP [facility]  Available via Epic secure chat 7am-7pm for nonurgent issues Please page for urgent issues To page the attending provider between 7A-7P or the covering provider during after hours 7P-7A, please log into the web site [email or web address] and access using universal [place] password for that web site. If you do not have the password, please call the hospital operator.    07/05/2020, 10:32 AM

## 2020-07-06 DIAGNOSIS — N390 Urinary tract infection, site not specified: Secondary | ICD-10-CM | POA: Diagnosis not present

## 2020-07-06 DIAGNOSIS — A419 Sepsis, unspecified organism: Secondary | ICD-10-CM | POA: Diagnosis not present

## 2020-07-06 LAB — BASIC METABOLIC PANEL
Anion gap: 9 (ref 5–15)
BUN: 16 mg/dL (ref 8–23)
CO2: 25 mmol/L (ref 22–32)
Calcium: 9.2 mg/dL (ref 8.9–10.3)
Chloride: 110 mmol/L (ref 98–111)
Creatinine, Ser: 0.73 mg/dL (ref 0.61–1.24)
GFR, Estimated: >60 mL/min (ref >60)
Glucose, Bld: 96 mg/dL (ref 70–99)
Potassium: 3.5 mmol/L (ref 3.5–5.1)
Sodium: 144 mmol/L (ref 135–145)

## 2020-07-06 LAB — CBC WITH DIFFERENTIAL/PLATELET
Abs Immature Granulocytes: 0.02 10*3/uL (ref 0.00–0.07)
Basophils Absolute: 0.1 10*3/uL (ref 0.0–0.1)
Basophils Relative: 1 %
Eosinophils Absolute: 0.4 10*3/uL (ref 0.0–0.5)
Eosinophils Relative: 5 %
HCT: 37.7 % — ABNORMAL LOW (ref 39.0–52.0)
Hemoglobin: 13.2 g/dL (ref 13.0–17.0)
Immature Granulocytes: 0 %
Lymphocytes Relative: 25 %
Lymphs Abs: 1.8 10*3/uL (ref 0.7–4.0)
MCH: 32.6 pg (ref 26.0–34.0)
MCHC: 35 g/dL (ref 30.0–36.0)
MCV: 93.1 fL (ref 80.0–100.0)
Monocytes Absolute: 0.4 10*3/uL (ref 0.1–1.0)
Monocytes Relative: 6 %
Neutro Abs: 4.7 10*3/uL (ref 1.7–7.7)
Neutrophils Relative %: 63 %
Platelets: 195 10*3/uL (ref 150–400)
RBC: 4.05 MIL/uL — ABNORMAL LOW (ref 4.22–5.81)
RDW: 14.1 % (ref 11.5–15.5)
WBC: 7.4 10*3/uL (ref 4.0–10.5)
nRBC: 0 % (ref 0.0–0.2)

## 2020-07-06 LAB — CULTURE, BLOOD (ROUTINE X 2)
Culture: NO GROWTH
Culture: NO GROWTH
Special Requests: ADEQUATE

## 2020-07-06 NOTE — Op Note (Signed)
NAME: William Bishop, William Bishop MEDICAL RECORD [id number] ACCOUNT [id number] DATE OF BIRTH:Jan 25, 1941 FACILITY: [facility] LOCATION: [facility] PHYSICIAN:Wayman Hoard Berneice Heinrich, MD  OPERATIVE REPORT  DATE OF PROCEDURE:  06/30/2020  PREOPERATIVE DIAGNOSES:  Right ureteral stone, refractory colic, and recent urinary infection.  PROCEDURES: 1.  Cystoscopy with right pyelogram with interpretation. 2.  Right ureteral stent 5 x 26 Polaris, no tether.  ESTIMATED BLOOD LOSS:  Nil.  COMPLICATIONS:  None.  SPECIMENS:  None.  FINDINGS: 1.  Very large bilobar prostatic hypertrophy, trabeculated bladder. 2.  Moderate right hydronephrosis with proximal stone in the ureter as expected. 3.  Successful placement of right ureteral stent proximal and mid pole calix, distal end in urinary bladder.  INDICATIONS:  The patient is an unfortunate 79 year old man with history of advanced dementia who lives at home with his wife who does have a fantastic job of providing most of his care.  He is presently admitted to the hospital with severe urinary tract  infection related to urinary retention and chronic catheter.  During this stay, his infectious parameters have improved, but he began having significant right-sided flank and abdominal pain as well as significant nausea.  Repeat renal imaging revealed a  new right ureteral stone with hydronephrosis.  Given his recent significant infection, it was felt that renal decompression was clearly warranted with stenting with likely addressing stone in a delayed fashion, and he presents for this today.  Informed  consent was obtained and placed in medical record.  Notably, his wife provides the majority of history and consent.  DESCRIPTION OF PROCEDURE:  The patient being William Bishop and procedure being right ureteral stent placement was confirmed.  Procedure timeout was performed.  Intravenous antibiotics were verified.  General anesthesia was introduced.  The patient was  placed into  a low lithotomy position.  Sterile field was created by prepping and draping the patient's penis, perineum, and proximal thighs using iodine.  Cystourethroscopy was performed using 21-French rigid cystoscope with offset lens.  Inspection of  anterior and posterior urethra revealed very large bilobar prostatic hypertrophy as anticipated.  Inspection of bladder revealed moderate trabeculation.  The ureteral orifices were quite difficult to appreciate.  The trigone was somewhat elevated above  his bladder neck.  The right ureteral orifice was encountered, cannulated with a 6-French renal catheter, and right retrograde pyelogram was obtained.  Right retrograde pyelogram demonstrated a single right ureter, single system right kidney.  There was some mild tortuosity and a filling defect in the proximal ureter consistent with known stone.  There was mild-to-moderate hydronephrosis above this.  A  0.03 ZIPwire was then advanced to the level of the upper mid calix, and a 5 x 26 Polaris-type stent was placed using fluoroscopic guidance.  Good proximal and distal planes were noted, and a 14-French Foley catheter was placed free to straight drain, 10  mL of water in the balloon.  The procedure was terminated.  The patient tolerated the procedure well.  No immediate periprocedural complications.  The patient was taken to postanesthesia care unit in stable condition.  Plan for continued inpatient admission.  IN/NUANCE  D:06/30/2020 T:07/01/2020 JOB:[id number]

## 2020-07-06 NOTE — Progress Notes (Signed)
PROGRESS NOTE    Geanie BerlinJimmie R Schreckengost  ZOX:[id number] DOB: 12/03/1940 DOA: 06/24/2020 PCP: Dois Davenportichter, Karen L, MD   Chief Complaint  Patient presents with  . Code Sepsis  . Altered Mental Status    Brief Narrative: 79 year old male with history of severe dementia, BPH, urinary retention status post indwelling Foley, recently removed presents with increased confusion and fever.  He was admitted for severe sepsis with Citrobacter failure and UTI and bacteremia, started on Cipro 511/17.  Indwelling Foley catheter reinserted due to urine retention. Patient has been progressively declining, spouse wondering what palliative care option in particular has been consulted  Subjective: Demented.Eyes closed did not open.  Mitten in place. Spouse at the bedside. Foley catheter clear.  Assessment & Plan:  Sepsis secondary to Citrobacter bacteremia and UTI, present on admission: Continue cefepime, last dose tonight.  Hemodynamically stable afebrile  Right ureteral stone status post stent placement on #23 continue antibiotics as above.  Catheter associated UTI: Foley reinserted due to urinary retention/BPH.  Continue Flomax.  Complete antibiotics as #1.  Gross hematuria 18 French catheter clogged up by blood clots, immediately flushed multiple times then had gross hematuria discussion Dr. Ronne BinningMcKenzie changed to three-way 20 [place]French and CBI until urine clears and SCDs H&H stable. Off CBI and on lovenox. Urine clear.  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  Now with poor oral intake.  Hypokalemia resolved  Hypertension in the related to sepsis.  Monitor.  Advanced dementia/acute metabolic encephalopathy on presentation due to UTI: Patient appears to have poor intake. Wife looking into hospice care.  Palliative care has been consulted  GOC: With dementia, patient has been declining progressively as per the wife.  She is looking into hospice care rather than a skilled nursing facility if he  qualifies.  Nutrition: Diet Order            Diet regular Room service appropriate? Yes; Fluid consistency: Thin  Diet effective now                 Obesity with BMI Body mass index is 32.17 kg/m.  DVT prophylaxis: enoxaparin (LOVENOX) injection 40 mg Start: 07/05/20 1700 Place and maintain sequential compression device Start: 07/01/20 1332 Code Status:   Code Status: DNR  Family Communication: plan of care discussed with patient at bedside.  Status is: Inpatient  Remains inpatient appropriate because:IV treatments appropriate due to intensity of illness or inability to take PO and Inpatient level of care appropriate due to severity of illness   Dispo: The patient is from: Home              Anticipated d/c is to: SNF vs Hospice              Anticipated d/c date is: 1 day              Patient currently is not medically stable to d/c.         Consultants:see note  Procedures:see note  Culture/Microbiology    Component Value Date/Time   SDES  07/01/2020 1429    BLOOD RIGHT ANTECUBITAL Performed at [facility], [address] [place], [place] [address]    SPECREQUEST  07/01/2020 1429    BOTTLES DRAWN AEROBIC AND ANAEROBIC Blood Culture adequate volume Performed at [facility], [address] [place], [place] [id number]    CULT  07/01/2020 1429    NO GROWTH 5 DAYS Performed at [facility], [address]  [place], [place] [address]    REPTSTATUS 07/06/2020 FINAL 07/01/2020 1429    Other culture-see note  Medications: Scheduled Meds: . Chlorhexidine Gluconate Cloth  6 each Topical Daily  . enoxaparin (LOVENOX) injection  40 mg Subcutaneous Q24H  . finasteride  5 mg Oral Daily  . QUEtiapine  50 mg Oral QHS  . tamsulosin  0.4 mg Oral QPC supper   Continuous Infusions: . ceFEPime (MAXIPIME) IV 2 g (07/06/20 1304)  . sodium chloride irrigation Stopped (07/02/20 1916)    Antimicrobials: Anti-infectives (From  admission, onward)   Start     Dose/Rate Route Frequency Ordered Stop   07/03/20 2000  ceFEPIme (MAXIPIME) 2 g in sodium chloride 0.9 % 100 mL IVPB        2 g 200 mL/hr over 30 Minutes Intravenous Every 8 hours 07/03/20 1722 07/06/20 2359   07/03/20 1000  ceFEPIme (MAXIPIME) 2 g in sodium chloride 0.9 % 100 mL IVPB  Status:  Discontinued        2 g 200 mL/hr over 30 Minutes Intravenous Every 8 hours 07/03/20 0859 07/03/20 1722   06/27/20 2359  ceFEPIme (MAXIPIME) 2 g in sodium chloride 0.9 % 100 mL IVPB  Status:  Discontinued        2 g 200 mL/hr over 30 Minutes Intravenous Every 12 hours 06/27/20 1328 07/03/20 0859   06/25/20 1700  ceFEPIme (MAXIPIME) 2 g in sodium chloride 0.9 % 100 mL IVPB  Status:  Discontinued        2 g 200 mL/hr over 30 Minutes Intravenous Every 8 hours 06/25/20 1522 06/27/20 1328   06/25/20 0930  ceFEPIme (MAXIPIME) 2 g in sodium chloride 0.9 % 100 mL IVPB  Status:  Discontinued        2 g 200 mL/hr over 30 Minutes Intravenous Every 8 hours 06/25/20 0854 06/25/20 1522   06/25/20 0114  cefTRIAXone (ROCEPHIN) 1 g in sodium chloride 0.9 % 100 mL IVPB  Status:  Discontinued        1 g 200 mL/hr over 30 Minutes Intravenous Every 24 hours 06/25/20 0114 06/25/20 0854   06/24/20 1815  ceFEPIme (MAXIPIME) 2 g in sodium chloride 0.9 % 100 mL IVPB        2 g 200 mL/hr over 30 Minutes Intravenous  Once 06/24/20 1813 06/24/20 1912     Objective: Vitals: Today's Vitals   07/05/20 1648 07/05/20 2223 07/06/20 0300 07/06/20 0521  BP:  130/65  137/83  Pulse:  96  96  Resp:  17  20  Temp:  98.1 F (36.7 C)  98.2 F (36.8 C)  TempSrc:  Oral  Oral  SpO2:  95%    Weight:   90.4 kg   Height:      PainSc: Asleep       Intake/Output Summary (Last 24 hours) at 07/06/2020 1432 Last data filed at 07/06/2020 1000 Gross per 24 hour  Intake 1936.78 ml  Output 1225 ml  Net 711.78 ml   Filed Weights   07/02/20 0500 07/03/20 0500 07/06/20 0300  Weight: 94.8 kg 92.9 kg 90.4  kg   Weight change:   Intake/Output from previous day: 11/28 0701 - 11/29 0700 In: 2161.8 [I.V.:1161.8; IV Piggyback:1000] Out: 1425 [Urine:1425] Intake/Output this shift: Total I/O In: 125 [P.O.:25; IV Piggyback:100] Out: 300 [Urine:300]  Examination: General exam: AAOx0, eye closed, does not respond imitative place, intermittently moving his extremities HEENT:Oral mucosa moist, Ear/Nose WNL grossly,dentition normal. Respiratory system: bilaterally clear,no wheezing or crackles,no use of accessory  muscle, non tender. Cardiovascular system: S1 & S2 +, regular, No JVD. Gastrointestinal system: Abdomen soft, NT,ND, BS+. Nervous System: Moving his extremities, mitten in place, eyes closed , responds to pain.   Extremities: No edema, distal peripheral pulses palpable.  Skin: No rashes,no icterus. MSK: Normal muscle bulk,tone, power  Data Reviewed: I have personally reviewed following labs and imaging studies CBC: Recent Labs  Lab 07/01/20 0923 07/02/20 1240 07/03/20 1307 07/05/20 0700 07/06/20 0613  WBC 11.1* 8.3 7.6 10.9* 7.4  NEUTROABS 8.9* 5.4 4.9 8.0* 4.7  HGB 14.3 13.0 13.8 14.0 13.2  HCT 40.7 36.6* 39.5 38.9* 37.7*  MCV 93.3 92.9 93.2 91.7 93.1  PLT 152 153 196 212 195   Basic Metabolic Panel: Recent Labs  Lab 06/30/20 0543 06/30/20 0543 07/01/20 0923 07/02/20 1240 07/03/20 1307 07/05/20 0700 07/06/20 0613  NA 138   < > 137 141 141 145 144  K 3.7   < > 4.1 3.6 3.7 3.3* 3.5  CL 109   < > 106 109 107 110 110  CO2 20*   < > 21* 26 24 23 25   GLUCOSE 88   < > 103* 92 95 103* 96  BUN 20   < > 17 15 13 16 16   CREATININE 1.65*   < > 1.03 1.02 1.05 1.09 0.73  CALCIUM 8.4*   < > 8.7* 8.6* 9.0 9.2 9.2  MG 2.1  --  2.1  --   --  2.1  --   PHOS 2.6  --  2.0*  --   --  2.5  --    < > = values in this interval not displayed.   GFR: Estimated Creatinine Clearance: 78.8 mL/min (by C-G formula based on SCr of 0.73 mg/dL). Liver Function Tests: Recent Labs  Lab  06/30/20 0543 07/01/20 0923  ALBUMIN 3.1* 3.2*   No results for input(s): LIPASE, AMYLASE in the last 168 hours. No results for input(s): AMMONIA in the last 168 hours. Coagulation Profile: No results for input(s): INR, PROTIME in the last 168 hours. Cardiac Enzymes: No results for input(s): CKTOTAL, CKMB, CKMBINDEX, TROPONINI in the last 168 hours. BNP (last 3 results) No results for input(s): PROBNP in the last 8760 hours. HbA1C: No results for input(s): HGBA1C in the last 72 hours. CBG: Recent Labs  Lab 07/01/20 1155 07/01/20 1704  GLUCAP 84 102*   Lipid Profile: No results for input(s): CHOL, HDL, LDLCALC, TRIG, CHOLHDL, LDLDIRECT in the last 72 hours. Thyroid Function Tests: No results for input(s): TSH, T4TOTAL, FREET4, T3FREE, THYROIDAB in the last 72 hours. Anemia Panel: No results for input(s): VITAMINB12, FOLATE, FERRITIN, TIBC, IRON, RETICCTPCT in the last 72 hours. Sepsis Labs: No results for input(s): PROCALCITON, LATICACIDVEN in the last 168 hours.  Recent Results (from the past 240 hour(s))  Surgical pcr screen     Status: None   Collection Time: 06/30/20 12:54 PM   Specimen: Nasal Mucosa; Nasal Swab  Result Value Ref Range Status   MRSA, PCR NEGATIVE NEGATIVE Final   Staphylococcus aureus NEGATIVE NEGATIVE Final    Comment: (NOTE) The Xpert SA Assay (FDA approved for NASAL specimens in patients 5 years of age and older), is one component of a comprehensive surveillance program. It is not intended to diagnose infection nor to guide or monitor treatment. Performed at [facility], [address] [place], [place]   Culture, blood (routine x 2)     Status: None   Collection Time: 07/01/20  2:22  PM   Specimen: BLOOD  Result Value Ref Range Status   Specimen Description   Final    BLOOD LEFT ARM Performed at [facility], [address], [place], [place] [address]    Special Requests   Final    BOTTLES  DRAWN AEROBIC ONLY Blood Culture results may not be optimal due to an inadequate volume of blood received in culture bottles   Culture   Final    NO GROWTH 5 DAYS Performed at [facility], [address] [place], [place] [address]    Report Status 07/06/2020 FINAL  Final  Culture, blood (routine x 2)     Status: None   Collection Time: 07/01/20  2:29 PM   Specimen: BLOOD  Result Value Ref Range Status   Specimen Description   Final    BLOOD RIGHT ANTECUBITAL Performed at [facility], [address] [place], [place] [address]    Special Requests   Final    BOTTLES DRAWN AEROBIC AND ANAEROBIC Blood Culture adequate volume Performed at [facility], [address], [place], [place] [address]    Culture   Final    NO GROWTH 5 DAYS Performed at [facility], [address] [place], [place] [address]    Report Status 07/06/2020 FINAL  Final     Radiology Studies: No results found.   LOS: 12 days   Lanae Boast, MD [facility]  07/06/2020, 2:32 PM

## 2020-07-06 NOTE — Care Management Important Message (Signed)
Important Message  Patient Details IM Letter given to the Patient. Name: William Bishop MRN: [id number] Date of Birth: 1941/06/13   Medicare Important Message Given:  Yes     Caren Macadam 07/06/2020, 11:04 AM

## 2020-07-06 NOTE — Progress Notes (Signed)
PT Cancellation Note  Patient Details Name: William Bishop MRN: [id number] DOB: 1940-09-11   Cancelled Treatment:    Reason Eval/Treat Not Completed: Other (comment) Spouse reports she is waiting to talk with palliative care and hospice.  She reports pt has been refusing most care.     Leanthony Rhett,KATHrine E 07/06/2020, 10:19 AM Paulino Door, DPT Acute Rehabilitation Services Pager: [phone] Office: [phone]

## 2020-07-06 NOTE — TOC Progression Note (Signed)
Transition of Care [facility]) - Progression Note    Patient Details  Name: William Bishop MRN: [id number] Date of Birth: [birth year]  Transition of Care [facility]) CM/SW Contact  Taisa Deloria, Olegario Messier, RN Phone Number: 07/06/2020, 12:02 PM  Clinical Narrative: Sherron Monday to spouse-William Bishop-in agreement to residential hospice-will await palliative care team.Continue to monitor.      Expected Discharge Plan: Hospice Medical Facility Barriers to Discharge: Continued Medical Work up  Expected Discharge Plan and Services Expected Discharge Plan: Hospice Medical Facility       Living arrangements for the past 2 months: Single Family Home                                       Social Determinants of Health (SDOH) Interventions    Readmission Risk Interventions No flowsheet data found.

## 2020-07-07 DIAGNOSIS — Z7189 Other specified counseling: Secondary | ICD-10-CM

## 2020-07-07 DIAGNOSIS — Z515 Encounter for palliative care: Secondary | ICD-10-CM

## 2020-07-07 MED ORDER — GLYCOPYRROLATE 1 MG PO TABS
1.0000 mg | ORAL_TABLET | ORAL | Status: DC | PRN
  Filled 2020-07-07: qty 1

## 2020-07-07 MED ORDER — GLYCOPYRROLATE 0.2 MG/ML IJ SOLN
0.2000 mg | INTRAMUSCULAR | Status: DC | PRN
  Filled 2020-07-07: qty 1

## 2020-07-07 MED ORDER — POLYVINYL ALCOHOL 1.4 % OP SOLN
1.0000 [drp] | Freq: Four times a day (QID) | OPHTHALMIC | Status: DC | PRN
  Filled 2020-07-07: qty 15

## 2020-07-07 MED ORDER — BIOTENE DRY MOUTH MT LIQD: 15.0000 mL | OROMUCOSAL | Status: DC | PRN

## 2020-07-07 MED ORDER — HALOPERIDOL LACTATE 5 MG/ML IJ SOLN
0.5000 mg | INTRAMUSCULAR | Status: DC | PRN
  Administered 2020-07-07 – 2020-07-08 (×4): 0.5 mg via INTRAVENOUS
  Filled 2020-07-07 (×5): qty 1

## 2020-07-07 MED ORDER — HALOPERIDOL 0.5 MG PO TABS
0.5000 mg | ORAL_TABLET | ORAL | Status: DC | PRN
  Filled 2020-07-07: qty 1

## 2020-07-07 MED ORDER — HALOPERIDOL LACTATE 2 MG/ML PO CONC
0.5000 mg | ORAL | Status: DC | PRN
  Filled 2020-07-07: qty 0.3

## 2020-07-07 NOTE — Progress Notes (Signed)
PROGRESS NOTE    William Bishop  WIO:[id number] DOB: 10/16/1940 DOA: 06/24/2020 PCP: [name], MD   Chief Complaint  Patient presents with  . Code Sepsis  . Altered Mental Status    Brief Narrative: 79 year old male with history of severe dementia, BPH, urinary retention status post indwelling Foley, recently removed presents with increased confusion and fever.  He was admitted for severe sepsis with Citrobacter failure and UTI and bacteremia, started on Cipro 511/17.  Indwelling Foley catheter reinserted due to urine retention. Patient has been progressively declining, spouse wondering what palliative care option in particular has been consulted  Subjective: Demented, mitten place, responds to pain eyes are open not eating or drinking.  No family at bedside.  Assessment & Plan:  Sepsis secondary to Citrobacter bacteremia and UTI, present on admission: Continue cefepime, last dose 11/29 night  Right ureteral stone status post stent placement .completed antibiotics   Catheter associated UTI: Foley reinserted due to urinary retention/BPH.  Continue Flomax.  Completed antibiotics as #1.  Gross hematuria 18 French catheter clogged up by blood clots, immediately flushed multiple times then had gross hematuria discussion Dr. [name] changed to three-way 20 [place] and CBI until urine clears and SCDs H&H stable.  Off CBI and off Lovenox.  Urine is mostly clear.   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  Now with poor oral intake at risk of renal failure  Hypokalemia resolved  Hypertension blood pressure is stable.   Advanced dementia/acute metabolic encephalopathy on presentation due to UTI: Patient appears to have poor intake. Wife looking into hospice care.  She would like to discuss palliative care awaiting for palliative meeting with the family  GOC: With dementia, patient has been declining progressively as per the wife.  Patient's wife is preferring hospice care as  she feels patient may not benefit with skilled nursing rehabilitation.    Nutrition: Diet Order            Diet regular Room service appropriate? Yes; Fluid consistency: Thin  Diet effective now                 Obesity with BMI Body mass index is 32.17 kg/m.  DVT prophylaxis: enoxaparin (LOVENOX) injection 40 mg Start: 07/05/20 1700 Place and maintain sequential compression device Start: 07/01/20 1332 Code Status:   Code Status: DNR  Family Communication: plan of care discussed with patient/s wife 11/29 at bedside.  Status is: Inpatient  Remains inpatient appropriate because:IV treatments appropriate due to intensity of illness or inability to take PO and Inpatient level of care appropriate due to severity of illness   Dispo: The patient is from: Home              Anticipated d/c is to: SNF vs Hospice              Anticipated d/c date is: 1 day              Patient currently is not medically stable to d/c.   Consultants:see note  Procedures:see note  Culture/Microbiology    Component Value Date/Time   SDES  07/01/2020 1429    BLOOD RIGHT ANTECUBITAL Performed at [facility], [address], [place], [place] [address]    SPECREQUEST  07/01/2020 1429    BOTTLES DRAWN AEROBIC AND ANAEROBIC Blood Culture adequate volume Performed at [facility], [address] [place], [place] [address]    CULT  07/01/2020 1429    NO  GROWTH 5 DAYS Performed at [facility], [address] [place], [place] [id number]    REPTSTATUS 07/06/2020 FINAL 07/01/2020 1429    Other culture-see note  Medications: Scheduled Meds: . Chlorhexidine Gluconate Cloth  6 each Topical Daily  . enoxaparin (LOVENOX) injection  40 mg Subcutaneous Q24H  . finasteride  5 mg Oral Daily  . QUEtiapine  50 mg Oral QHS  . tamsulosin  0.4 mg Oral QPC supper   Continuous Infusions: . sodium chloride irrigation Stopped (07/02/20 1916)     Antimicrobials: Anti-infectives (From admission, onward)   Start     Dose/Rate Route Frequency Ordered Stop   07/03/20 2000  ceFEPIme (MAXIPIME) 2 g in sodium chloride 0.9 % 100 mL IVPB        2 g 200 mL/hr over 30 Minutes Intravenous Every 8 hours 07/03/20 1722 07/06/20 2039   07/03/20 1000  ceFEPIme (MAXIPIME) 2 g in sodium chloride 0.9 % 100 mL IVPB  Status:  Discontinued        2 g 200 mL/hr over 30 Minutes Intravenous Every 8 hours 07/03/20 0859 07/03/20 1722   06/27/20 2359  ceFEPIme (MAXIPIME) 2 g in sodium chloride 0.9 % 100 mL IVPB  Status:  Discontinued        2 g 200 mL/hr over 30 Minutes Intravenous Every 12 hours 06/27/20 1328 07/03/20 0859   06/25/20 1700  ceFEPIme (MAXIPIME) 2 g in sodium chloride 0.9 % 100 mL IVPB  Status:  Discontinued        2 g 200 mL/hr over 30 Minutes Intravenous Every 8 hours 06/25/20 1522 06/27/20 1328   06/25/20 0930  ceFEPIme (MAXIPIME) 2 g in sodium chloride 0.9 % 100 mL IVPB  Status:  Discontinued        2 g 200 mL/hr over 30 Minutes Intravenous Every 8 hours 06/25/20 0854 06/25/20 1522   06/25/20 0114  cefTRIAXone (ROCEPHIN) 1 g in sodium chloride 0.9 % 100 mL IVPB  Status:  Discontinued        1 g 200 mL/hr over 30 Minutes Intravenous Every 24 hours 06/25/20 0114 06/25/20 0854   06/24/20 1815  ceFEPIme (MAXIPIME) 2 g in sodium chloride 0.9 % 100 mL IVPB        2 g 200 mL/hr over 30 Minutes Intravenous  Once 06/24/20 1813 06/24/20 1912     Objective: Vitals: Today's Vitals   07/06/20 2010 07/06/20 2034 07/07/20 0531 07/07/20 1256  BP:  (!) 150/96 (!) 144/95 (!) 141/98  Pulse:  (!) 105 98 (!) 107  Resp:  18 18 20   Temp:  98 F (36.7 C) 98.4 F (36.9 C) 99.1 F (37.3 C)  TempSrc:  Axillary    SpO2:  100% 100% 96%  Weight:      Height:      PainSc: 0-No pain       Intake/Output Summary (Last 24 hours) at 07/07/2020 1454 Last data filed at 07/07/2020 0538 Gross per 24 hour  Intake 100 ml  Output 850 ml  Net -750 ml    Filed Weights   07/02/20 0500 07/03/20 0500 07/06/20 0300  Weight: 94.8 kg 92.9 kg 90.4 kg   Weight change:   Intake/Output from previous day: 11/29 0701 - 11/30 0700 In: 225 [P.O.:25; IV Piggyback:200] Out: 1150 [Urine:1150] Intake/Output this shift: No intake/output data recorded.  Examination: General exam: AAOx0 , NAD, weak appearing. HEENT:Oral mucosa moist, Ear/Nose WNL grossly, dentition normal. Respiratory system: bilaterally clear,no wheezing or crackles,no use of accessory  muscle Cardiovascular system: S1 & S2 +, No JVD,. Gastrointestinal system: Abdomen soft, NT,ND, BS+ Nervous System:Alert, awake, moving extremities and grossly nonfocal Extremities: No edema, distal peripheral pulses palpable.  Skin: No rashes,no icterus. MSK: Normal muscle bulk,tone, power    Data Reviewed: I have personally reviewed following labs and imaging studies CBC: Recent Labs  Lab 07/01/20 0923 07/02/20 1240 07/03/20 1307 07/05/20 0700 07/06/20 0613  WBC 11.1* 8.3 7.6 10.9* 7.4  NEUTROABS 8.9* 5.4 4.9 8.0* 4.7  HGB 14.3 13.0 13.8 14.0 13.2  HCT 40.7 36.6* 39.5 38.9* 37.7*  MCV 93.3 92.9 93.2 91.7 93.1  PLT 152 153 196 212 195   Basic Metabolic Panel: Recent Labs  Lab 07/01/20 0923 07/02/20 1240 07/03/20 1307 07/05/20 0700 07/06/20 0613  NA 137 141 141 145 144  K 4.1 3.6 3.7 3.3* 3.5  CL 106 109 107 110 110  CO2 21* 26 24 23 25   GLUCOSE 103* 92 95 103* 96  BUN 17 15 13 16 16   CREATININE 1.03 1.02 1.05 1.09 0.73  CALCIUM 8.7* 8.6* 9.0 9.2 9.2  MG 2.1  --   --  2.1  --   PHOS 2.0*  --   --  2.5  --    GFR: Estimated Creatinine Clearance: 78.8 mL/min (by C-G formula based on SCr of 0.73 mg/dL). Liver Function Tests: Recent Labs  Lab 07/01/20 0923  ALBUMIN 3.2*   No results for input(s): LIPASE, AMYLASE in the last 168 hours. No results for input(s): AMMONIA in the last 168 hours. Coagulation Profile: No results for input(s): INR, PROTIME in the last 168  hours. Cardiac Enzymes: No results for input(s): CKTOTAL, CKMB, CKMBINDEX, TROPONINI in the last 168 hours. BNP (last 3 results) No results for input(s): PROBNP in the last 8760 hours. HbA1C: No results for input(s): HGBA1C in the last 72 hours. CBG: Recent Labs  Lab 07/01/20 1155 07/01/20 1704  GLUCAP 84 102*   Lipid Profile: No results for input(s): CHOL, HDL, LDLCALC, TRIG, CHOLHDL, LDLDIRECT in the last 72 hours. Thyroid Function Tests: No results for input(s): TSH, T4TOTAL, FREET4, T3FREE, THYROIDAB in the last 72 hours. Anemia Panel: No results for input(s): VITAMINB12, FOLATE, FERRITIN, TIBC, IRON, RETICCTPCT in the last 72 hours. Sepsis Labs: No results for input(s): PROCALCITON, LATICACIDVEN in the last 168 hours.  Recent Results (from the past 240 hour(s))  Surgical pcr screen     Status: None   Collection Time: 06/30/20 12:54 PM   Specimen: Nasal Mucosa; Nasal Swab  Result Value Ref Range Status   MRSA, PCR NEGATIVE NEGATIVE Final   Staphylococcus aureus NEGATIVE NEGATIVE Final    Comment: (NOTE) The Xpert SA Assay (FDA approved for NASAL specimens in patients 73 years of age and older), is one component of a comprehensive surveillance program. It is not intended to diagnose infection nor to guide or monitor treatment. Performed at [facility], [address], [place], [place]   Culture, blood (routine x 2)     Status: None   Collection Time: 07/01/20  2:22 PM   Specimen: BLOOD  Result Value Ref Range Status   Specimen Description   Final    BLOOD LEFT ARM Performed at [facility], [address], [place], [place]    Special Requests   Final    BOTTLES DRAWN AEROBIC ONLY Blood Culture results may not be optimal due to an inadequate volume of blood received in culture bottles   Culture  Final    NO GROWTH 5 DAYS Performed at [facility], [address] [place], [place] [address]     Report Status 07/06/2020 FINAL  Final  Culture, blood (routine x 2)     Status: None   Collection Time: 07/01/20  2:29 PM   Specimen: BLOOD  Result Value Ref Range Status   Specimen Description   Final    BLOOD RIGHT ANTECUBITAL Performed at [facility], [address], [place], [place] [address]    Special Requests   Final    BOTTLES DRAWN AEROBIC AND ANAEROBIC Blood Culture adequate volume Performed at [facility], [address] [place], [place] [address]    Culture   Final    NO GROWTH 5 DAYS Performed at [facility], [address] [place], [place] [address]    Report Status 07/06/2020 FINAL  Final     Radiology Studies: No results found.   LOS: 13 days   Lanae Boast, MD [facility]  07/07/2020, 2:54 PM

## 2020-07-07 NOTE — Plan of Care (Signed)

## 2020-07-07 NOTE — Consult Note (Signed)
Consultation Note Date: 07/07/2020   Patient Name: William Bishop  DOB: January 11, 1941  MRN: [id number]  Age / Sex: 79 y.o., male  PCP: Hayden Rasmussen, MD Referring Physician: Antonieta Pert, MD  Reason for Consultation: Establishing goals of care  HPI/Patient Profile: 79 y.o. male  with past medical history of dementia, BPH, urinary retention following Foley admitted on 06/24/2020 with sepsis related to Citrobacter with bacteremia.  He had indwelling Foley catheter reinserted due to urinary retention and underwent CBI due to bleeding.  This has resolved, but he remains confused and is no longer eating and drinking.  Initial plan was for evaluation for skilled facility, however, his wife reports that she does not think that he is going to be able to rehab and has been considering pursuing more of a comfort based approach.  Palliative consulted for goals of care.  Clinical Assessment and Goals of Care: I met today with William Bishop wife.  We discussed difference between a aggressive medical intervention path and a palliative, comfort focused care path.  Values and goals of care important to patient and family were attempted to be elicited.  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 She reports that this was also his primary hobby. They have 3 children including 2 sons and 1 daughter. He has always been a very independent and proud man who worked hard for his entire life. She reports it has been difficult to watch these pieces of his life to be taken from them by dementia over the past 12 years. We discussed changes she has noted in his nutrition, cognition, and functional status and how these have acutely changed over the last couple of weeks. She has been discussing with her friends who work for hospice and her belief after  talking with them is that William Bishop would want to focus on quality of life and comfort moving forward in light of his continued decline and lack of any meaningful nutrition or hydration.  She attempted to give him something to drink this morning and he held the water in his mouth for 12 minutes before spitting it out again. She also gave him one bite of egg that he rolled around his mouth before spitting it out. I discussed with her and bedside care team and he has not had any meaningful intake for the last 5+ days. Most of his I's/O's have reflected no intake other than 1 meal 2 days ago. On is documented that he had 60% of this meal, his wife reports that she has been at his bedside most of each day and he has not consumed nearly this much. His bedside RN today reports he has had no intake and he also received signout that he has had no meaningful intake for at least the past few days.  Concept of Hospice and Palliative Care were discussed  Questions and concerns addressed.   PMT will continue to support  holistically.   SUMMARY OF RECOMMENDATIONS   DNR/DNR Full comfort moving forward.  Orders updated using EOL order set. Visitation per end-of-life policy. She would like to work to get him to [facility] for end-of-life care. Referral placed and appreciate evaluation.  Code Status/Advance Care Planning:  DNR  Prognosis:   < 2 weeks-William Bishop has had significant decline over the past few days. He is withdrawn and does not really interact with me during my encounter. He has had little to no p.o. intake for the last several days. Artificial hydration has now been stopped and he continues to decline anything by mouth. I believe his prognosis if his disease follows its natural course will be less than 2 weeks and he would be best served by residential hospice placement if it can be arranged.  Discharge Planning: Hospice facility      Primary Diagnoses: Present on Admission: . Sepsis  secondary to UTI ([place]) . Acute lower UTI . Sepsis ([place])   I have reviewed the medical record, interviewed the patient and family, and examined the patient. The following aspects are pertinent.  Past Medical History:  Diagnosis Date  . Dementia ([place])   . Renal disorder    Social History   Socioeconomic History  . Marital status: Unknown    Spouse name: Not on file  . Number of children: Not on file  . Years of education: Not on file  . Highest education level: Not on file  Occupational History  . Not on file  Tobacco Use  . Smoking status: Never Smoker  . Smokeless tobacco: Former Systems developer    Types: Secondary school teacher  . Vaping Use: Never used  Substance and Sexual Activity  . Alcohol use: Never  . Drug use: Never  . Sexual activity: Not on file  Other Topics Concern  . Not on file  Social History Narrative  . Not on file   Social Determinants of Health   Financial Resource Strain:   . Difficulty of Paying Living Expenses: Not on file  Food Insecurity:   . Worried About Charity fundraiser in the Last Year: Not on file  . Ran Out of Food in the Last Year: Not on file  Transportation Needs:   . Lack of Transportation (Medical): Not on file  . Lack of Transportation (Non-Medical): Not on file  Physical Activity:   . Days of Exercise per Week: Not on file  . Minutes of Exercise per Session: Not on file  Stress:   . Feeling of Stress : Not on file  Social Connections:   . Frequency of Communication with Friends and Family: Not on file  . Frequency of Social Gatherings with Friends and Family: Not on file  . Attends Religious Services: Not on file  . Active Member of Clubs or Organizations: Not on file  . Attends Archivist Meetings: Not on file  . Marital Status: Not on file   Family History  Family history unknown: Yes   Scheduled Meds: . Chlorhexidine Gluconate Cloth  6 each Topical Daily  . finasteride  5 mg Oral Daily  . QUEtiapine  50 mg Oral QHS    . tamsulosin  0.4 mg Oral QPC supper   Continuous Infusions: PRN Meds:.acetaminophen **OR** acetaminophen, antiseptic oral rinse, chlorproMAZINE, glycopyrrolate **OR** glycopyrrolate **OR** glycopyrrolate, haloperidol **OR** haloperidol **OR** haloperidol lactate, HYDROmorphone (DILAUDID) injection, LORazepam, ondansetron **OR** ondansetron (ZOFRAN) IV, polyvinyl alcohol Medications Prior to Admission:  Prior to Admission medications  Medication Sig Start Date End Date Taking? Authorizing Provider  Calcium Carbonate (CALCIUM 500 PO) Take 2 tablets by mouth daily.   Yes [provider]  Cholecalciferol (VITAMIN D3) 1.25 MG (50000 UT) TABS Take 2 tablets by mouth daily.   Yes [provider]  co-enzyme Q-10 30 MG capsule Take 30 mg by mouth daily.   Yes [provider]  doxycycline (VIBRAMYCIN) 100 MG capsule Take 1 capsule (100 mg total) by mouth 2 (two) times daily. 06/16/20  Yes Petrucelli, Samantha R, PA-C  isosorbide mononitrate (IMDUR) 30 MG 24 hr tablet Take 30 mg by mouth daily. 05/20/20  Yes [provider]  KRILL OIL PO Take 1 tablet by mouth daily.   Yes [provider]  Misc Natural Products (TOTAL CARDIO HEALTH FORMULA PO) Take 2 tablets by mouth daily. CARDIO BP- Supplement   Yes [provider]  OVER THE COUNTER MEDICATION Take 1 tablet by mouth daily. Immune 5 supplement   Yes [provider]  OVER THE COUNTER MEDICATION Take 1 tablet by mouth daily. Brain Elevate Supplement   Yes [provider]  QUEtiapine (SEROQUEL) 25 MG tablet Take 50 mg by mouth at bedtime.  05/15/20  Yes [provider]  vitamin C (ASCORBIC ACID) 250 MG tablet Take 250 mg by mouth daily.   Yes [provider]  polyethylene glycol (MIRALAX) 17 g packet Take 17 g by mouth daily as needed for moderate constipation. 06/16/20   Petrucelli, Samantha R, PA-C  traMADol (ULTRAM) 50 MG tablet Take 1 tablet (50 mg total) by mouth  every 6 (six) hours as needed for severe pain. 11/07/18   Norm Parcel, PA-C   No Known Allergies Review of Systems  Unreliable historian  Physical Exam  General: Sleeps through most of encounter, confused when he does state anything  HEENT: No bruits, no goiter, no JVD Heart: Regular rate and rhythm. No murmur appreciated. Lungs: Good air movement, clear Abdomen: Soft, nontender, nondistended, positive bowel sounds.  Ext: No significant edema Skin: Warm and dry Neuro: Does not follow commands  Vital Signs: BP (!) 138/97 (BP Location: Right Arm)   Pulse (!) 102   Temp 99 F (37.2 C)   Resp 18   Ht $R'5\' 6"'fX$  (1.676 m)   Wt 90.4 kg   SpO2 96%   BMI 32.17 kg/m  Pain Scale: PAINAD POSS *See Group Information*: S-Acceptable,Sleep, easy to arouse Pain Score: 0-No pain   SpO2: SpO2: 96 % O2 Device:SpO2: 96 % O2 Flow Rate: .O2 Flow Rate (L/min): 8 L/min  IO: Intake/output summary:   Intake/Output Summary (Last 24 hours) at 07/07/2020 2230 Last data filed at 07/07/2020 1827 Gross per 24 hour  Intake --  Output 1000 ml  Net -1000 ml    LBM: Last BM Date: 07/03/20 Baseline Weight: Weight: 95.3 kg Most recent weight: Weight: 90.4 kg     Palliative Assessment/Data:   Flowsheet Rows     Most Recent Value  Intake Tab  Referral Department Hospitalist  Unit at Time of Referral Med/Surg Unit  Palliative Care Primary Diagnosis Neurology  Date Notified 07/05/20  Palliative Care Type New Palliative care  Date of Admission 06/24/20  Date first seen by Palliative Care 07/07/20  # of days Palliative referral response time 2 Day(s)  # of days IP prior to Palliative referral 11  Clinical Assessment  Palliative Performance Scale Score 10%  Psychosocial & Spiritual Assessment  Palliative Care Outcomes  Patient/Family meeting held? Yes  Who  was at the meeting? wife      Time In: 1430 Time Out: 1550 Time Total: 80 Greater than 50%  of this time was spent counseling and  coordinating care related to the above assessment and plan.  Signed by: Micheline Rough, MD   Please contact Palliative Medicine Team phone at [phone] for questions and concerns.  For individual provider: See Shea Evans

## 2020-07-08 MED ORDER — FINASTERIDE 5 MG PO TABS: 5.0000 mg | ORAL_TABLET | Freq: Every day | ORAL | Status: AC

## 2020-07-08 MED ORDER — TAMSULOSIN HCL 0.4 MG PO CAPS: 0.4000 mg | ORAL_CAPSULE | Freq: Every day | ORAL | Status: AC

## 2020-07-08 MED ORDER — HALOPERIDOL LACTATE 5 MG/ML IJ SOLN
2.5000 mg | Freq: Once | INTRAMUSCULAR | Status: AC
  Administered 2020-07-08: 2.5 mg via INTRAVENOUS

## 2020-07-08 NOTE — TOC Transition Note (Signed)
Transition of Care [facility]) - CM/SW Discharge Note   Patient Details  Name: William Bishop MRN: [id number] Date of Birth: 23-Dec-1940  Transition of [facility]) CM/SW Contact:  Lanier Clam, RN Phone Number: 07/08/2020, 3:05 PM   Clinical Narrative:  [facility] [place] [phone] call report tel#[phone]. PTAR called. No further CM needs.     Final next level of care: Hospice Medical Facility Barriers to Discharge: No Barriers Identified   Patient Goals and CMS Choice Patient states their goals for this hospitalization and ongoing recovery are:: go home CMS Medicare.gov Compare Post Acute Care list provided to:: Patient Represenative (must comment) Choice offered to / list presented to : Spouse  Discharge Placement              Patient chooses bed at:  [facility] [address] [place] [address]) Patient to be transferred to facility by: PTAR Name of family member notified: Alice spouse [phone] Patient and family notified of of transfer: 07/08/20  Discharge Plan and Services                                     Social Determinants of Health (SDOH) Interventions     Readmission Risk Interventions No flowsheet data found.

## 2020-07-08 NOTE — Progress Notes (Signed)
Daily Progress Note   Patient Name: William Bishop       Date: 07/08/2020 DOB: 1940-10-12  Age: 79 y.o. MRN#: [id number] Attending Physician: Lanae Boast, MD Primary Care Physician: [name], MD Admit Date: 06/24/2020  Reason for Consultation/Follow-up: Establishing goals of care and Non pain symptom management  Subjective: I saw and examined William Bishop today.  He was lying in bed in no distress.  Discussed with bedside RN as well as his wife.  He has been having increased periods of agitation that has responded well to pain medication and Haldol.  Discussed with his wife plan for continued comfort care.  There is currently not a bed available at [facility].  She states she would also be open to hospice home in [place] if there is availability as it is also within easy driving distance of their home.  Provided emotional support and answered all questions to the best of my ability.  Provided anticipatory guidance on expectations moving forward as he progresses in the process of actively dying.  Length of Stay: 14  Current Medications: Scheduled Meds:  . Chlorhexidine Gluconate Cloth  6 each Topical Daily  . finasteride  5 mg Oral Daily  . QUEtiapine  50 mg Oral QHS  . tamsulosin  0.4 mg Oral QPC supper    Continuous Infusions:   PRN Meds: acetaminophen **OR** acetaminophen, antiseptic oral rinse, chlorproMAZINE, glycopyrrolate **OR** glycopyrrolate **OR** glycopyrrolate, haloperidol **OR** haloperidol **OR** haloperidol lactate, HYDROmorphone (DILAUDID) injection, LORazepam, ondansetron **OR** ondansetron (ZOFRAN) IV, polyvinyl alcohol  Physical Exam         General: Somnolent (following medication) HEENT: No bruits, no goiter, no JVD Heart: Tachycardic. No murmur  appreciated. Lungs: Fair air movement, clear Abdomen: Nondistended, positive bowel sounds.  Ext: No significant edema Skin: Warm and dry  Vital Signs: BP (!) 138/97 (BP Location: Right Arm)   Pulse (!) 102   Temp 99 F (37.2 C)   Resp 18   Ht 5\' 6"  (1.676 m)   Wt 90.4 kg   SpO2 96%   BMI 32.17 kg/m  SpO2: SpO2: 96 % O2 Device: O2 Device: Room Air O2 Flow Rate: O2 Flow Rate (L/min): 8 L/min  Intake/output summary:   Intake/Output Summary (Last 24 hours) at 07/08/2020 1224 Last data filed at 07/08/2020 0643 Gross per 24  hour  Intake --  Output 950 ml  Net -950 ml   LBM: Last BM Date: 07/03/20 Baseline Weight: Weight: 95.3 kg Most recent weight: Weight: 90.4 kg       Palliative Assessment/Data:    Flowsheet Rows     Most Recent Value  Intake Tab  Referral Department Hospitalist  Unit at Time of Referral Med/Surg Unit  Palliative Care Primary Diagnosis Neurology  Date Notified 07/05/20  Palliative Care Type New Palliative care  Date of Admission 06/24/20  Date first seen by Palliative Care 07/07/20  # of days Palliative referral response time 2 Day(s)  # of days IP prior to Palliative referral 11  Clinical Assessment  Palliative Performance Scale Score 10%  Psychosocial & Spiritual Assessment  Palliative Care Outcomes  Patient/Family meeting held? Yes  Who was at the meeting? wife      Patient Active Problem List   Diagnosis Date Noted  . Sepsis (HCC) 06/30/2020  . Sepsis secondary to UTI (HCC) 06/24/2020  . Acute lower UTI 06/24/2020  . Cholecystitis with cholelithiasis 11/03/2018    Palliative Care Assessment & Plan   Patient Profile: 79 y.o. male  with past medical history of dementia, BPH, urinary retention following Foley admitted on 06/24/2020 with sepsis related to Citrobacter with bacteremia.  He had indwelling Foley catheter reinserted due to urinary retention and underwent CBI due to bleeding.  This has resolved, but he remains confused and  is no longer eating and drinking.    Plan moving forward is for comfort care and hopeful transition to residential hospice for end-of-life care.  Assessment: Patient Active Problem List   Diagnosis Date Noted  . Sepsis (HCC) 06/30/2020  . Sepsis secondary to UTI (HCC) 06/24/2020  . Acute lower UTI 06/24/2020  . Cholecystitis with cholelithiasis 11/03/2018   Recommendations/Plan:  Full comfort care moving forward.  He has had increasing symptom burden.  Continue aggressive management with opioids and Haldol as needed.  Hopeful for transition to residential hospice for end-of-life care.  Family preference for [employer] [facility]  due to family location in [place].  Goals of Care and Additional Recommendations:  Limitations on Scope of Treatment: Full Comfort Care  Code Status:    Code Status Orders  (From admission, onward)         Start     Ordered   07/07/20 1548  Do not attempt resuscitation (DNR)  Continuous       Question Answer Comment  In the event of cardiac or respiratory ARREST Do not call a "code blue"   In the event of cardiac or respiratory ARREST Do not perform Intubation, CPR, defibrillation or ACLS   In the event of cardiac or respiratory ARREST Use medication by any route, position, wound care, and other measures to relive pain and suffering. May use oxygen, suction and manual treatment of airway obstruction as needed for comfort.      07/07/20 1548        Code Status History    Date Active Date Inactive Code Status Order ID Comments User Context   06/28/2020 1021 07/07/2020 1548 DNR [id number]  Almon Hercules, MD Inpatient   06/25/2020 0114 06/28/2020 1021 Full Code [id number]  Rometta Emery, MD Inpatient   11/03/2018 0138 11/06/2018 1329 Full Code [id number]  Griselda Miner, MD Inpatient   Advance Care Planning Activity       Prognosis:   < 2 weeks- William Bishop has had significant decline over the past few days.  He is withdrawn and does not  really interact with me during my encounter. He has had little to no p.o. intake for the last several days. Artificial hydration has now been stopped and he continues to refuse anything by mouth. I believe his prognosis if his disease follows its natural course will be less than 2 weeks and he has been having increased symptom burden requiring pain medication and Haldol.  He would be best served by residential hospice placement if it can be arranged.  Discharge Planning:  Hospice facility  Care plan was discussed with patient's wife, bedside RN, care management  Thank you for allowing the Palliative Medicine Team to assist in the care of this patient.   Time In: 1200 Time Out: 1225 Total Time 25 Prolonged Time Billed No      Greater than 50%  of this time was spent counseling and coordinating care related to the above assessment and plan.  Romie Minus, MD  Please contact Palliative Medicine Team phone at [phone] for questions and concerns.

## 2020-07-08 NOTE — Progress Notes (Signed)
Report called to Sherry RN

## 2020-07-08 NOTE — Progress Notes (Signed)
Visited with patient and wife.  Patient sleeping.  Wife had lots on mind. She is at peace and husband was able to speak with his sons and say things to them that he may not be able to say again.  Wife is at peace much more than yesterday.   07/08/20 1300  Clinical Encounter Type  Visited With Patient and family together  Visit Type Initial;Social support;Other (Comment)  Referral From Nurse  Consult/Referral To Chaplain  Spiritual Encounters  Spiritual Needs Other (Comment) (comfort care)   Phebe Colla, Chaplain

## 2020-07-08 NOTE — Progress Notes (Signed)
PROGRESS NOTE    William Bishop  QVZ:[id number] DOB: 01/16/1941 DOA: 06/24/2020 PCP: [name], MD   Chief Complaint  Patient presents with  . Code Sepsis  . Altered Mental Status    Brief Narrative: 79 year old male with history of severe dementia, BPH, urinary retention status post indwelling Foley, recently removed presents with increased confusion and fever.  He was admitted for severe sepsis with Citrobacter failure and UTI and bacteremia, started on Cipro 511/17.  Indwelling Foley catheter reinserted due to urine retention. Patient has been progressively declining, spouse wondering what palliative care.  Seen by palliative care and has been accepted for inpatient hospice pending bed availability  Subjective:  Patient is alert awake oriented to self.  Wife is at the bedside.  Appears comfortable.  Assessment & Plan:  Sepsis secondary to Citrobacter bacteremia and UTI, present on admission: Patient completed cefepime.  He is afebrile.    Right ureteral stone status post stent placement .completed antibiotics.  Continue Foley  Catheter associated UTI: Foley reinserted due to urinary retention/BPH.  Continue Flomax.  Completed antibiotics as #1.  Clinically stable  Gross hematuria 18 French catheter clogged up by blood clots, immediately flushed multiple times then had gross hematuria discussion Dr. [name] changed to three-way 20 [place] and CBI until urine clears and SCDs H&H stable.  Off CBI and off Lovenox.  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  Now with poor oral intake at risk of renal failure and further worsening.  Hypokalemia resolved  Hypertension controlled  Advanced dementia/acute metabolic encephalopathy on presentation due to UTI: Patient appears to have poor intake.  Seen by palliative care plan is for transfer to [facility]/inpatient hospice once bed available, based upon patient and patient's wife request  GOC: With  dementia, patient has been declining progressively as per the wife.  Patient's wife is preferring hospice care as she feels patient may not benefit with skilled nursing rehabilitation.  He has been accepted for inpatient hospice  Nutrition: Diet Order            Diet regular Room service appropriate? Yes; Fluid consistency: Thin  Diet effective now                 Obesity with BMI Body mass index is 32.17 kg/m.  DVT prophylaxis: Place and maintain sequential compression device Start: 07/01/20 1332 Code Status:   Code Status: DNR  Family Communication: plan of care discussed with patient/s wife 11/29 at bedside.  Status is: Inpatient  Remains inpatient appropriate because:IV treatments appropriate due to intensity of illness or inability to take PO and Inpatient level of care appropriate due to severity of illness   Dispo: The patient is from: Home              Anticipated d/c is PI:[id number]              Anticipated d/c date is: 1 day              Patient currently is medically stable for discharge   Consultants:see note  Procedures:see note  Culture/Microbiology    Component Value Date/Time   SDES  07/01/2020 1429    BLOOD RIGHT ANTECUBITAL Performed at [facility], [address], [place], [place] [address]    SPECREQUEST  07/01/2020 1429    BOTTLES DRAWN AEROBIC AND ANAEROBIC Blood Culture adequate volume Performed at [facility], [address] [place], [place] [address]  CULT  07/01/2020 1429    NO GROWTH 5 DAYS Performed at [facility], [address] [place], [place] [id number]    REPTSTATUS 07/06/2020 FINAL 07/01/2020 1429    Other culture-see note  Medications: Scheduled Meds: . Chlorhexidine Gluconate Cloth  6 each Topical Daily  . finasteride  5 mg Oral Daily  . QUEtiapine  50 mg Oral QHS  . tamsulosin  0.4 mg Oral QPC supper   Continuous Infusions:   Antimicrobials: Anti-infectives (From  admission, onward)   Start     Dose/Rate Route Frequency Ordered Stop   07/03/20 2000  ceFEPIme (MAXIPIME) 2 g in sodium chloride 0.9 % 100 mL IVPB        2 g 200 mL/hr over 30 Minutes Intravenous Every 8 hours 07/03/20 1722 07/06/20 2039   07/03/20 1000  ceFEPIme (MAXIPIME) 2 g in sodium chloride 0.9 % 100 mL IVPB  Status:  Discontinued        2 g 200 mL/hr over 30 Minutes Intravenous Every 8 hours 07/03/20 0859 07/03/20 1722   06/27/20 2359  ceFEPIme (MAXIPIME) 2 g in sodium chloride 0.9 % 100 mL IVPB  Status:  Discontinued        2 g 200 mL/hr over 30 Minutes Intravenous Every 12 hours 06/27/20 1328 07/03/20 0859   06/25/20 1700  ceFEPIme (MAXIPIME) 2 g in sodium chloride 0.9 % 100 mL IVPB  Status:  Discontinued        2 g 200 mL/hr over 30 Minutes Intravenous Every 8 hours 06/25/20 1522 06/27/20 1328   06/25/20 0930  ceFEPIme (MAXIPIME) 2 g in sodium chloride 0.9 % 100 mL IVPB  Status:  Discontinued        2 g 200 mL/hr over 30 Minutes Intravenous Every 8 hours 06/25/20 0854 06/25/20 1522   06/25/20 0114  cefTRIAXone (ROCEPHIN) 1 g in sodium chloride 0.9 % 100 mL IVPB  Status:  Discontinued        1 g 200 mL/hr over 30 Minutes Intravenous Every 24 hours 06/25/20 0114 06/25/20 0854   06/24/20 1815  ceFEPIme (MAXIPIME) 2 g in sodium chloride 0.9 % 100 mL IVPB        2 g 200 mL/hr over 30 Minutes Intravenous  Once 06/24/20 1813 06/24/20 1912     Objective: Vitals: Today's Vitals   07/07/20 0531 07/07/20 1256 07/07/20 1500 07/07/20 2033  BP: (!) 144/95 (!) 141/98  (!) 138/97  Pulse: 98 (!) 107  (!) 102  Resp: 18 20  18   Temp: 98.4 F (36.9 C) 99.1 F (37.3 C)  99 F (37.2 C)  TempSrc:      SpO2: 100% 96%    Weight:      Height:      PainSc:   0-No pain     Intake/Output Summary (Last 24 hours) at 07/08/2020 1339 Last data filed at 07/08/2020 0643 Gross per 24 hour  Intake --  Output 950 ml  Net -950 ml   Filed Weights   07/02/20 0500 07/03/20 0500 07/06/20 0300   Weight: 94.8 kg 92.9 kg 90.4 kg   Weight change:   Intake/Output from previous day: 11/30 0701 - 12/01 0700 In: -  Out: 950 [Urine:950] Intake/Output this shift: No intake/output data recorded.  Examination: General exam: AAx1 to self , NAD, weak appearing. HEENT:Oral mucosa moist, Ear/Nose WNL grossly, dentition normal. Respiratory system: bilaterally clear,no wheezing or crackles,no use of accessory muscle Cardiovascular system: S1 & S2 +, No JVD,. Gastrointestinal system:  Abdomen soft, NT,ND, BS+ Nervous System:Alert, awake, moving extremities mitten in place  Extremities: No edema, distal peripheral pulses palpable.  Skin: No rashes,no icterus. MSK: Normal muscle bulk,tone, power  Data Reviewed: I have personally reviewed following labs and imaging studies CBC: Recent Labs  Lab 07/02/20 1240 07/03/20 1307 07/05/20 0700 07/06/20 0613  WBC 8.3 7.6 10.9* 7.4  NEUTROABS 5.4 4.9 8.0* 4.7  HGB 13.0 13.8 14.0 13.2  HCT 36.6* 39.5 38.9* 37.7*  MCV 92.9 93.2 91.7 93.1  PLT 153 196 212 195   Basic Metabolic Panel: Recent Labs  Lab 07/02/20 1240 07/03/20 1307 07/05/20 0700 07/06/20 0613  NA 141 141 145 144  K 3.6 3.7 3.3* 3.5  CL 109 107 110 110  CO2 26 24 23 25   GLUCOSE 92 95 103* 96  BUN 15 13 16 16   CREATININE 1.02 1.05 1.09 0.73  CALCIUM 8.6* 9.0 9.2 9.2  MG  --   --  2.1  --   PHOS  --   --  2.5  --    GFR: Estimated Creatinine Clearance: 78.8 mL/min (by C-G formula based on SCr of 0.73 mg/dL). Liver Function Tests: No results for input(s): AST, ALT, ALKPHOS, BILITOT, PROT, ALBUMIN in the last 168 hours. No results for input(s): LIPASE, AMYLASE in the last 168 hours. No results for input(s): AMMONIA in the last 168 hours. Coagulation Profile: No results for input(s): INR, PROTIME in the last 168 hours. Cardiac Enzymes: No results for input(s): CKTOTAL, CKMB, CKMBINDEX, TROPONINI in the last 168 hours. BNP (last 3 results) No results for input(s):  PROBNP in the last 8760 hours. HbA1C: No results for input(s): HGBA1C in the last 72 hours. CBG: Recent Labs  Lab 07/01/20 1704  GLUCAP 102*   Lipid Profile: No results for input(s): CHOL, HDL, LDLCALC, TRIG, CHOLHDL, LDLDIRECT in the last 72 hours. Thyroid Function Tests: No results for input(s): TSH, T4TOTAL, FREET4, T3FREE, THYROIDAB in the last 72 hours. Anemia Panel: No results for input(s): VITAMINB12, FOLATE, FERRITIN, TIBC, IRON, RETICCTPCT in the last 72 hours. Sepsis Labs: No results for input(s): PROCALCITON, LATICACIDVEN in the last 168 hours.  Recent Results (from the past 240 hour(s))  Surgical pcr screen     Status: None   Collection Time: 06/30/20 12:54 PM   Specimen: Nasal Mucosa; Nasal Swab  Result Value Ref Range Status   MRSA, PCR NEGATIVE NEGATIVE Final   Staphylococcus aureus NEGATIVE NEGATIVE Final    Comment: (NOTE) The Xpert SA Assay (FDA approved for NASAL specimens in patients 16 years of age and older), is one component of a comprehensive surveillance program. It is not intended to diagnose infection nor to guide or monitor treatment. Performed at [facility], [address], [place], [place]   Culture, blood (routine x 2)     Status: None   Collection Time: 07/01/20  2:22 PM   Specimen: BLOOD  Result Value Ref Range Status   Specimen Description   Final    BLOOD LEFT ARM Performed at [facility], [address], [place], [place]    Special Requests   Final    BOTTLES DRAWN AEROBIC ONLY Blood Culture results may not be optimal due to an inadequate volume of blood received in culture bottles   Culture   Final    NO GROWTH 5 DAYS Performed at [facility], [address] [place], [address] [place]    Report Status 07/06/2020 FINAL  Final  Culture, blood (routine x 2)     Status: None   Collection Time: 07/01/20  2:29 PM   Specimen: BLOOD  Result Value Ref Range Status    Specimen Description   Final    BLOOD RIGHT ANTECUBITAL Performed at [facility], [address] [place], [place] [address]    Special Requests   Final    BOTTLES DRAWN AEROBIC AND ANAEROBIC Blood Culture adequate volume Performed at [facility], [address] [place], [place] [address]    Culture   Final    NO GROWTH 5 DAYS Performed at [facility], [address] [place], [place] [address]    Report Status 07/06/2020 FINAL  Final     Radiology Studies: No results found.   LOS: 14 days   Lanae Boast, MD [facility]  07/08/2020, 1:39 PM

## 2020-07-08 NOTE — Consult Note (Signed)
[facility]: Referral received for inpatient hospice. Call placed to wife to return call. Awaiting return call for further work up. Reola Calkins, RN [phone]

## 2020-07-08 NOTE — Consult Note (Signed)
[facility]: Pt has been approved for GIP level of care at [facility]. Wife will sign consents at the [facility] prior to patient arrival. SW to arrange transport, complete medical transfer form and assure DNR completed for transport. Report number [id number]. [facility] is ready to receive patient as soon as transport available. Thank you for the opportunity to serve this patient and family. [address] [place], RN [facility] [phone]

## 2020-07-08 NOTE — Discharge Summary (Signed)
Physician Discharge Summary  William Bishop:[id number] DOB: 12-Mar-1941 DOA: 06/24/2020  PCP: [name], MD  Admit date: 06/24/2020 Discharge date: 07/08/2020  Admitted From: home Disposition:  HOSPICE  Recommendations for Outpatient Follow-up:  1. Follow up with PCP in 1-2 weeks 2. Please obtain BMP/CBC in one week 3. Please follow up on the following pending results:  Home Health:NO  Equipment/Devices: NONE  Discharge Condition: Stable Code Status:   Code Status: DNR Diet recommendation:  Diet Order            Diet regular Room service appropriate? Yes; Fluid consistency: Thin  Diet effective now                  Brief/Interim Summary: 79 year old male with history of severe dementia, BPH, urinary retention status post indwelling Foley, recently removed presents with increased confusion and fever.  He was admitted for severe sepsis with Citrobacter failure and UTI and bacteremia, started on Cipro 511/17.  Indwelling Foley catheter reinserted due to urine retention. Patient has been progressively declining, spouse wondering what palliative care.  Seen by palliative care and has been accepted for inpatient hospice pending bed availability  Discharge diagnosis  Assessment & Plan:  Sepsis secondary to Citrobacter bacteremia and UTI, present on admission: Patient completed cefepime.  He is afebrile.    Right ureteral stone status post stent placement .completed antibiotics.  Continue Foley  Catheter associated UTI: Foley reinserted due to urinary retention/BPH.  Continue Flomax.  Completed antibiotics as #1.  Clinically stable  Gross hematuria 18 French catheter clogged up by blood clots, immediately flushed multiple times then had gross hematuria discussion Dr. [name] changed to three-way 20 [place] and CBI until urine clears and SCDs H&H stable.  Off CBI and off Lovenox.  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  Now with poor oral  intake at risk of renal failure and further worsening.  Hypokalemia resolved  Hypertension controlled  Advanced dementia/acute metabolic encephalopathy on presentation due to UTI: Patient appears to have poor intake.  Seen by palliative care plan is for transfer to [facility]/inpatient hospice once bed available, based upon patient and patient's wife request  GOC: With dementia, patient has been declining progressively as per the wife.  Patient's wife is preferring hospice care as she feels patient may not benefit with skilled nursing rehabilitation.  He has been accepted for inpatient hospice  Subjective: He is alert awake oriented to self.  Wife is at the bedside. Discharge Exam: Vitals:   07/07/20 1256 07/07/20 2033  BP: (!) 141/98 (!) 138/97  Pulse: (!) 107 (!) 102  Resp: 20 18  Temp: 99.1 F (37.3 C) 99 F (37.2 C)  SpO2: 96%    General: Pt is alert, awake, not in acute distress Cardiovascular: RRR, S1/S2 +, no rubs, no gallops Respiratory: CTA bilaterally, no wheezing, no rhonchi Abdominal: Soft, NT, ND, bowel sounds + Extremities: no edema, no cyanosis  Discharge Instructions   Allergies as of 07/08/2020   No Known Allergies     Medication List    STOP taking these medications   doxycycline 100 MG capsule Commonly known as: VIBRAMYCIN     TAKE these medications   CALCIUM 500 PO Take 2 tablets by mouth daily.   co-enzyme Q-10 30 MG capsule Take 30 mg by mouth daily.   finasteride 5 MG tablet Commonly known as: PROSCAR Take 1 tablet (5 mg total) by mouth daily. Start taking on: July 09, 2020   isosorbide mononitrate 30 MG 24 hr tablet Commonly known as: IMDUR Take 30 mg by mouth daily.   KRILL OIL PO Take 1 tablet by mouth daily.   OVER THE COUNTER MEDICATION Take 1 tablet by mouth daily. Immune 5 supplement   OVER THE COUNTER MEDICATION Take 1 tablet by mouth daily. Brain Elevate Supplement   polyethylene glycol 17 g packet Commonly  known as: MiraLax Take 17 g by mouth daily as needed for moderate constipation.   QUEtiapine 25 MG tablet Commonly known as: SEROQUEL Take 50 mg by mouth at bedtime.   tamsulosin 0.4 MG Caps capsule Commonly known as: FLOMAX Take 1 capsule (0.4 mg total) by mouth daily after supper.   TOTAL CARDIO HEALTH FORMULA PO Take 2 tablets by mouth daily. CARDIO BP- Supplement   traMADol 50 MG tablet Commonly known as: ULTRAM Take 1 tablet (50 mg total) by mouth every 6 (six) hours as needed for severe pain.   vitamin C 250 MG tablet Commonly known as: ASCORBIC ACID Take 250 mg by mouth daily.   Vitamin D3 1.25 MG (50000 UT) Tabs Take 2 tablets by mouth daily.       No Known Allergies  The results of significant diagnostics from this hospitalization (including imaging, microbiology, ancillary and laboratory) are listed below for reference.    Microbiology: Recent Results (from the past 240 hour(s))  Surgical pcr screen     Status: None   Collection Time: 06/30/20 12:54 PM   Specimen: Nasal Mucosa; Nasal Swab  Result Value Ref Range Status   MRSA, PCR NEGATIVE NEGATIVE Final   Staphylococcus aureus NEGATIVE NEGATIVE Final    Comment: (NOTE) The Xpert SA Assay (FDA approved for NASAL specimens in patients 6 years of age and older), is one component of a comprehensive surveillance program. It is not intended to diagnose infection nor to guide or monitor treatment. Performed at [facility], [address], [place], [place] [address]   Culture, blood (routine x 2)     Status: None   Collection Time: 07/01/20  2:22 PM   Specimen: BLOOD  Result Value Ref Range Status   Specimen Description   Final    BLOOD LEFT ARM Performed at [facility], [address], [place], [place] [address]    Special Requests   Final    BOTTLES DRAWN AEROBIC ONLY Blood Culture results may not be optimal due to an inadequate volume of blood received in culture  bottles   Culture   Final    NO GROWTH 5 DAYS Performed at [facility], [address], [place], [place] [address]    Report Status 07/06/2020 FINAL  Final  Culture, blood (routine x 2)     Status: None   Collection Time: 07/01/20  2:29 PM   Specimen: BLOOD  Result Value Ref Range Status   Specimen Description   Final    BLOOD RIGHT ANTECUBITAL Performed at [facility], [address] [place], [place] [address]    Special Requests   Final    BOTTLES DRAWN AEROBIC AND ANAEROBIC Blood Culture adequate volume Performed at [facility], [address] [place], [place] [address]    Culture   Final    NO GROWTH 5 DAYS Performed at [facility], [address] [place], [place] [address]    Report Status 07/06/2020 FINAL  Final    Procedures/Studies: CT Abdomen Pelvis W Contrast  Result Date: 06/16/2020 CLINICAL DATA:  Abdominal pain, nonlocalized, a with urinating past bowel movement EXAM: CT ABDOMEN AND PELVIS WITH CONTRAST TECHNIQUE: Multidetector CT imaging of the abdomen and pelvis was performed using the standard protocol following bolus administration of intravenous contrast. CONTRAST:  OMNIPAQUE IOHEXOL 300 MG/ML  SOLN COMPARISON:  CT 11/02/2018 FINDINGS: Lower chest: Consolidative opacity is seen in the inferior aspect of the right upper lobe, partially included within the level of imaging. More diffuse subpleural reticular changes and ground-glass, may reflect an a combination of interstitial change and atelectasis given the appearance on this imaging. Stable subpleural 7 mm nodule in the right middle lobe (6/12). A previously seen right upper lobe perifissural nodule is beyond the margins of imaging. Small 5 mm subpleural nodule in the right lower lobe is stable (6/14). Stable 7 mm nodule in the lingula adjacent the cardiac apex. Hepatobiliary: Diffuse hepatic hypoattenuation compatible with hepatic steatosis. No focal liver  abnormality is seen. Patient is post cholecystectomy. No biliary dilatation. No calcified intraductal gallstones. Pancreas: No pancreatic ductal dilatation or surrounding inflammatory changes. Spleen: Normal in size. No concerning splenic lesions. Adrenals/Urinary Tract: Normal adrenal glands. Kidneys are normally located with symmetric enhancement and excretion. Multiple fluid attenuation cysts are present in both kidneys including a combination of cortical and parapelvic cysts bilaterally. No concerning renal mass. Few clustered nonobstructing calculi are again seen in the lower pole right kidney. No obstructing calculi are seen. Some mild bilateral ureterectasis is seen with distal periureteral stranding. Mild symmetric bilateral perinephric stranding, similar to prior exam, a nonspecific finding. Bladder decompressed by inflated Foley catheter. Some mild wall thickening perivesicular hazy stranding is noted. Stomach/Bowel: Small sliding-type hiatal hernia. Distal stomach and duodenum are unremarkable. No small bowel thickening or dilatation. A normal appendix is visualized. No colonic dilatation or wall thickening. No evidence of obstruction. Vascular/Lymphatic: Atherosclerotic calcifications within the abdominal aorta and branch vessels. No aneurysm or ectasia. No enlarged abdominopelvic lymph nodes. Reproductive: Enlarged prostate gland, similar to comparison. No focal lesion or abnormality of the seminal vesicles is seen. Traversed by the inflated Foley catheter. Other: No bowel containing hernias. Slightly increased stranding within a stable sized fat containing umbilical hernia. Some trace free fluid/inflammatory changes seen along the low anterior pelvis and left pelvic sidewall, nonspecific, possibly reactive. No free air. Musculoskeletal: No acute osseous abnormality or suspicious osseous lesion. Multilevel degenerative changes are present in the imaged portions of the spine. Additional degenerative  changes in the hips and pelvis. IMPRESSION: 1. Consolidative opacity in the inferior aspect of the right upper lobe, partially included within the level of imaging. Findings are concerning for pneumonia. 2. Some mild bilateral ureterectasis is seen with distal periureteral stranding with perivesicular stranding. No obstructing calculi are seen. Findings could reflect a recently passed stone or ascending urinary tract infection with some nonspecific bilateral symmetric stranding as well. Correlate with patient's symptoms and consider urinalysis. 3. Some trace free fluid/inflammatory changes along the low anterior pelvis and left pelvic sidewall, nonspecific, possibly reactive. 4. No evidence of bowel obstruction or large colonic stool burden. 5. Enlarged prostate gland, similar to comparison exam. 6. Hepatic steatosis. 7. Aortic Atherosclerosis (ICD10-I70.0). Electronically Signed   By: Kreg Shropshire M.D.   On: 06/16/2020 01:06   US RENAL  Result Date: 06/27/2020 CLINICAL DATA:  79 year old male with acute renal failure. EXAM: RENAL / URINARY TRACT ULTRASOUND COMPLETE COMPARISON:  CT Abdomen and Pelvis 06/16/2020, 11/02/2018 FINDINGS: Right Kidney: Renal measurements: 15.2 x 6.7 x 6.8 cm =  volume: 365 mL. No right hydronephrosis. Multiple chronic benign appearing renal cysts (fewer than 10 cysts) measuring up to 3.9 cm diameter (image 9). Chronic right lower pole nephrolithiasis redemonstrated (image 18). Cortical echogenicity within normal limits. Left Kidney: Renal measurements: 13.9 x 6.2 x 5.9 cm = volume: 266 mL. Echogenicity within normal limits. No mass or hydronephrosis visualized. Bladder: Decompressed by a Foley catheter balloon (image 63). Other: None. IMPRESSION: 1. Negative ultrasound appearance of both kidneys aside from right kidney benign renal cysts and lower pole nephrolithiasis. 2. Bladder decompressed by Foley catheter. Electronically Signed   By: Odessa FlemingH  Hall M.D.   On: 06/27/2020 12:57   DG  Chest Port 1 View  Result Date: 06/24/2020 CLINICAL DATA:  79 year old male with possible sepsis. EXAM: PORTABLE CHEST 1 VIEW COMPARISON:  CT Abdomen and Pelvis 06/16/2020. FINDINGS: Portable AP semi upright view at 2022 hours. Low lung volumes. Mediastinal contours remain within normal limits. Visualized tracheal air column is within normal limits. No pneumothorax. No pleural effusion is evident. Partially visible right upper lobe airspace opacity on the recent CT is subtle radiographically. No definite confluent opacity elsewhere. Paucity of bowel gas in the upper abdomen. Amorphous calcific density projecting over the proximal right humerus may be external artifact. No acute osseous abnormality identified. IMPRESSION: 1. Subtle opacity in the lateral right upper lobe where evidence of pneumonia was partially visible on the recent CT. 2. Low lung volumes.  No other acute cardiopulmonary abnormality. Electronically Signed   By: Odessa FlemingH  Hall M.D.   On: 06/24/2020 20:30   DG ABD ACUTE 2+V W 1V CHEST  Result Date: 06/15/2020 CLINICAL DATA:  Constipation. EXAM: DG ABDOMEN ACUTE WITH 1 VIEW CHEST COMPARISON:  None. FINDINGS: The lung volumes are low. The cardiomediastinal silhouette is extension weighted. There is a hazy airspace opacity in the right mid lung zone. There is no pneumothorax or large pleural effusion. There is a large amount of stool throughout the colon. Multiple right-sided kidney stones are noted. There are few calcifications in the right hemipelvis favored to represent phleboliths. There are degenerative changes of the spine and both hips. IMPRESSION: 1. Hazy airspace opacity in the right mid lung zone. This may represent atelectasis or developing infiltrate. 2. Large stool burden. 3. Right-sided nephrolithiasis. Electronically Signed   By: Katherine Mantlehristopher  Green M.D.   On: 06/15/2020 22:30   DG C-Arm 1-60 Min-No Report  Result Date: 06/30/2020 Fluoroscopy was utilized by the requesting physician.   No radiographic interpretation.   CT RENAL STONE STUDY  Result Date: 06/29/2020 CLINICAL DATA:  Flank pain EXAM: CT ABDOMEN AND PELVIS WITHOUT CONTRAST TECHNIQUE: Multidetector CT imaging of the abdomen and pelvis was performed following the standard protocol without IV contrast. COMPARISON:  Ultrasound from 06/27/2020, CT from 06/16/2020 FINDINGS: Lower chest: Persistent but mildly improved infiltrate in the right upper lobe. Some stable subpleural nodules are noted on the right as well. No new focal infiltrate is seen. Hepatobiliary: No focal liver abnormality is seen. Status post cholecystectomy. No biliary dilatation. Pancreas: Unremarkable. No pancreatic ductal dilatation or surrounding inflammatory changes. Spleen: Normal in size without focal abnormality. Adrenals/Urinary Tract: Adrenal glands are within normal limits. Fullness of the renal collecting system on the right is seen increased when compared with the prior exam with evidence of migration of multiple calculi into the proximal right ureter. The largest of these measures approximately 6-7 mm. Residual stones are noted in the lower pole of the right kidney similar to that noted on the prior exam. Renal  cystic change is again identified and stable. Left kidney demonstrates no definitive calculi. The bladder is decompressed by Foley catheter. Stomach/Bowel: Appendix is within normal limits. Scattered diverticular changes noted within the colon without evidence of diverticulitis. No small bowel abnormality is noted. Hiatal hernia is noted. Vascular/Lymphatic: Aortic atherosclerosis. No enlarged abdominal or pelvic lymph nodes. Reproductive: Prostate is enlarged. Other: No abdominal wall hernia or abnormality. No abdominopelvic ascites. Musculoskeletal: Mild degenerative changes of lumbar spine are noted. 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 The largest of these measures approximately 6-7 mm. They remain lower pole  calculi on the right stable from the prior exam. Mild hydronephrosis is noted on the right. Small hiatal hernia. Diverticulosis without diverticulitis. Renal cystic changes noted stable from the prior study. Electronically Signed   By: Alcide Clever M.D.   On: 06/29/2020 16:37    Labs: BNP (last 3 results) No results for input(s): BNP in the last 8760 hours. Basic Metabolic Panel: Recent Labs  Lab 07/02/20 1240 07/03/20 1307 07/05/20 0700 07/06/20 0613  NA 141 141 145 144  K 3.6 3.7 3.3* 3.5  CL 109 107 110 110  CO2 26 24 23 25   GLUCOSE 92 95 103* 96  BUN 15 13 16 16   CREATININE 1.02 1.05 1.09 0.73  CALCIUM 8.6* 9.0 9.2 9.2  MG  --   --  2.1  --   PHOS  --   --  2.5  --    Liver Function Tests: No results for input(s): AST, ALT, ALKPHOS, BILITOT, PROT, ALBUMIN in the last 168 hours. No results for input(s): LIPASE, AMYLASE in the last 168 hours. No results for input(s): AMMONIA in the last 168 hours. CBC: Recent Labs  Lab 07/02/20 1240 07/03/20 1307 07/05/20 0700 07/06/20 0613  WBC 8.3 7.6 10.9* 7.4  NEUTROABS 5.4 4.9 8.0* 4.7  HGB 13.0 13.8 14.0 13.2  HCT 36.6* 39.5 38.9* 37.7*  MCV 92.9 93.2 91.7 93.1  PLT 153 196 212 195   Cardiac Enzymes: No results for input(s): CKTOTAL, CKMB, CKMBINDEX, TROPONINI in the last 168 hours. BNP: Invalid input(s): POCBNP CBG: Recent Labs  Lab 07/01/20 1704  GLUCAP 102*   D-Dimer No results for input(s): DDIMER in the last 72 hours. Hgb A1c No results for input(s): HGBA1C in the last 72 hours. Lipid Profile No results for input(s): CHOL, HDL, LDLCALC, TRIG, CHOLHDL, LDLDIRECT in the last 72 hours. Thyroid function studies No results for input(s): TSH, T4TOTAL, T3FREE, THYROIDAB in the last 72 hours.  Invalid input(s): FREET3 Anemia work up No results for input(s): VITAMINB12, FOLATE, FERRITIN, TIBC, IRON, RETICCTPCT in the last 72 hours. Urinalysis    Component Value Date/Time   COLORURINE YELLOW 06/24/2020 1828    APPEARANCEUR HAZY (A) 06/24/2020 1828   LABSPEC 1.013 06/24/2020 1828   PHURINE 5.0 06/24/2020 1828   GLUCOSEU NEGATIVE 06/24/2020 1828   HGBUR MODERATE (A) 06/24/2020 1828   BILIRUBINUR NEGATIVE 06/24/2020 1828   KETONESUR NEGATIVE 06/24/2020 1828   PROTEINUR NEGATIVE 06/24/2020 1828   NITRITE NEGATIVE 06/24/2020 1828   LEUKOCYTESUR LARGE (A) 06/24/2020 1828   Sepsis Labs Invalid input(s): PROCALCITONIN,  WBC,  LACTICIDVEN Microbiology Recent Results (from the past 240 hour(s))  Surgical pcr screen     Status: None   Collection Time: 06/30/20 12:54 PM   Specimen: Nasal Mucosa; Nasal Swab  Result Value Ref Range Status   MRSA, PCR NEGATIVE NEGATIVE Final   Staphylococcus aureus NEGATIVE NEGATIVE Final    Comment: (NOTE) The  Xpert SA Assay (FDA approved for NASAL specimens in patients 20 years of age and older), is one component of a comprehensive surveillance program. It is not intended to diagnose infection nor to guide or monitor treatment. Performed at [facility], [address], [place], [place] [address]   Culture, blood (routine x 2)     Status: None   Collection Time: 07/01/20  2:22 PM   Specimen: BLOOD  Result Value Ref Range Status   Specimen Description   Final    BLOOD LEFT ARM Performed at [facility], [address], [place], [place] [address]    Special Requests   Final    BOTTLES DRAWN AEROBIC ONLY Blood Culture results may not be optimal due to an inadequate volume of blood received in culture bottles   Culture   Final    NO GROWTH 5 DAYS Performed at [facility], [address], [place], [place] [address]    Report Status 07/06/2020 FINAL  Final  Culture, blood (routine x 2)     Status: None   Collection Time: 07/01/20  2:29 PM   Specimen: BLOOD  Result Value Ref Range Status   Specimen Description   Final    BLOOD RIGHT ANTECUBITAL Performed at [facility], [address],  [place], [place] [address]    Special Requests   Final    BOTTLES DRAWN AEROBIC AND ANAEROBIC Blood Culture adequate volume Performed at [facility], [address], [place], [place] [address]    Culture   Final    NO GROWTH 5 DAYS Performed at [facility], [address] [place], [place] [address]    Report Status 07/06/2020 FINAL  Final     Time coordinating discharge: 25  minutes  SIGNED: Lanae Boast, MD  [facility] 07/08/2020, 2:46 PM  If 7PM-7AM, please contact night-coverage [email or web address]

## 2020-07-08 NOTE — Progress Notes (Signed)
Civil engineer, contracting [facility])  Referral received for residential hospice at [facility].  We do not have a bed available to offer today.  ACC will update TOC and family once bed status changes.  Wallis Bamberg RN, BSN, CCRN [facility] Liaison

## 2021-08-02 IMAGING — CT CT ABD-PELV W/ CM
2 of 6 series · 14 of 46 positions shown, 16 images · IV contrast (omnipaque)
Comparison: CT 11/02/2018

CLINICAL DATA: Abdominal pain, nonlocalized, a with urinating past
bowel movement

EXAM:
CT ABDOMEN AND PELVIS WITH CONTRAST
TECHNIQUE: Multidetector CT imaging of the abdomen and pelvis was performed
using the standard protocol following bolus administration of
intravenous contrast.
CONTRAST:  100mL OMNIPAQUE IOHEXOL 300 MG/ML  SOLN

[Series 2: axial st · axial · 0.83mm/px · z∈[+1239,+1674]mm · 11 of 103 slices shown, 13 images]
[im 8/103  soft-tissue]
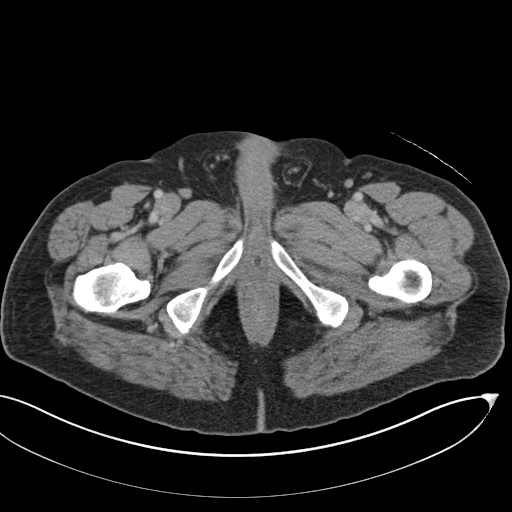
[im 8/103  bone]
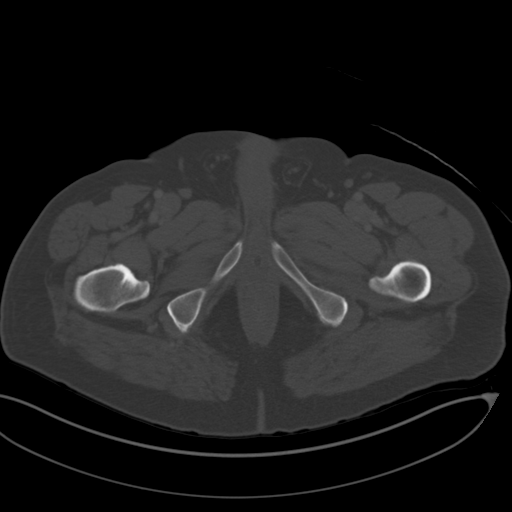
[im 16/103  soft-tissue]
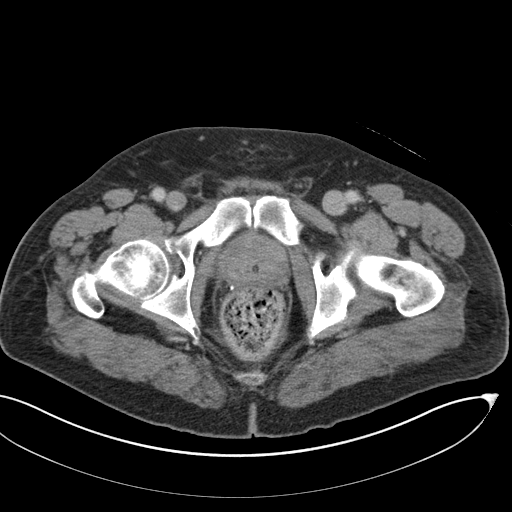
[im 24/103  soft-tissue]
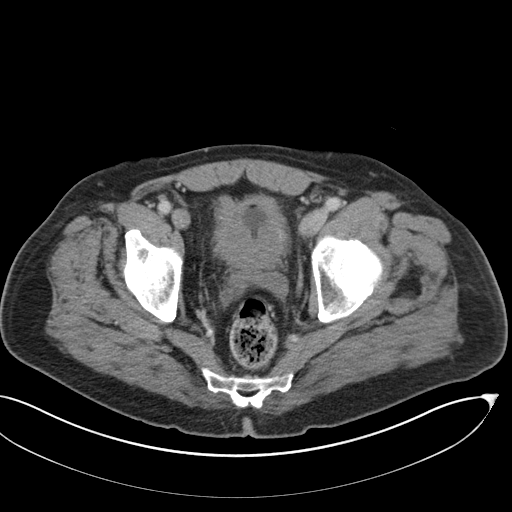
[im 32/103  soft-tissue]
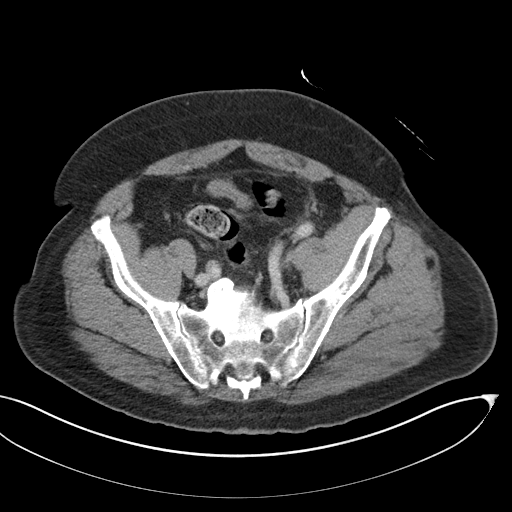
[im 40/103  soft-tissue]
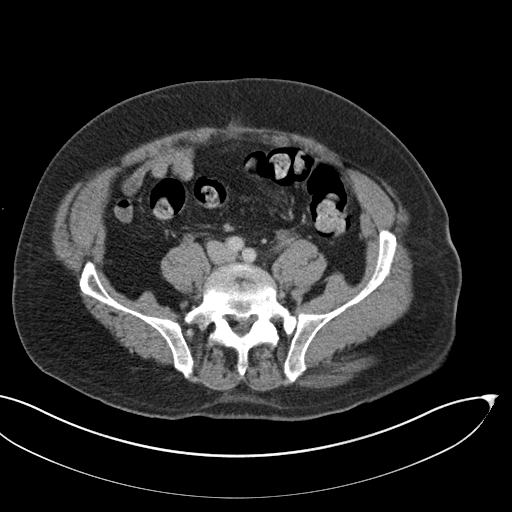
[im 55/103  soft-tissue]
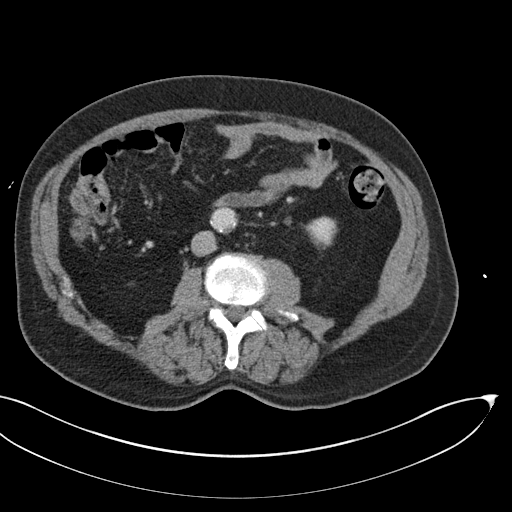
[im 63/103  soft-tissue]
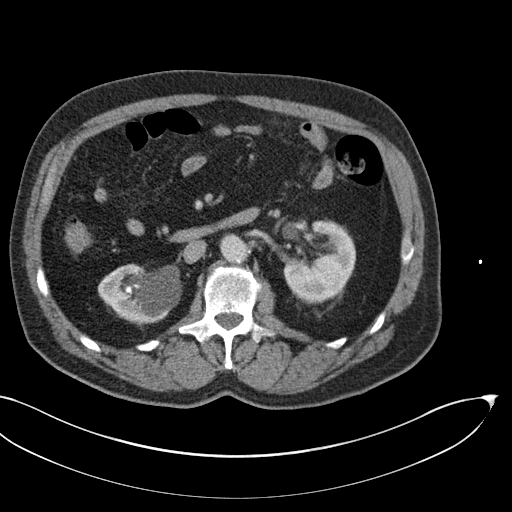
[im 71/103  soft-tissue]
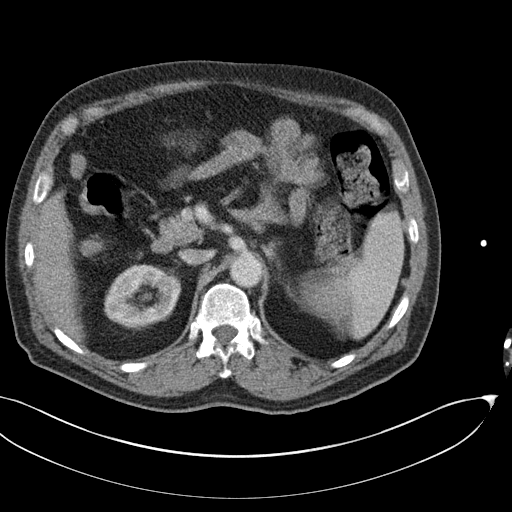
[im 79/103  soft-tissue]
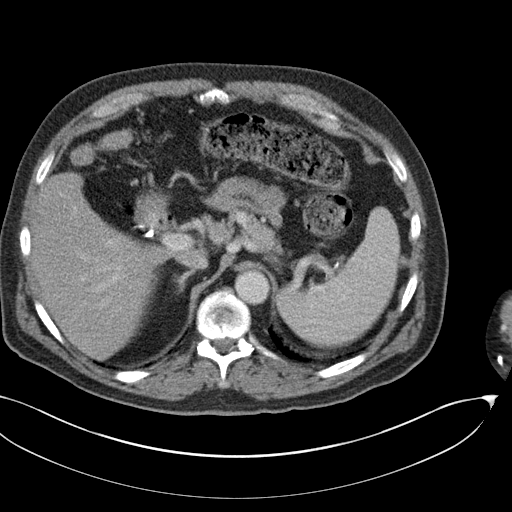
[im 79/103  bone]
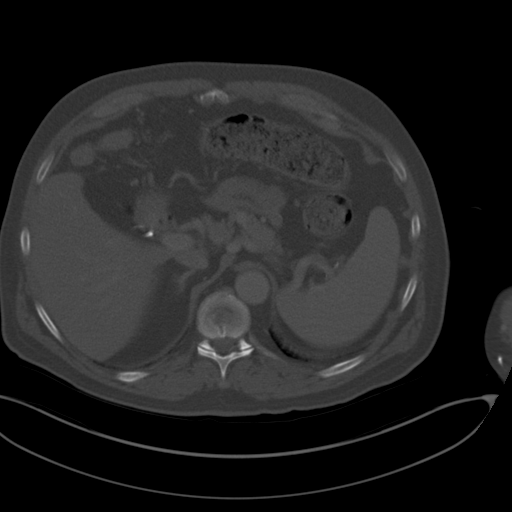
[im 87/103  soft-tissue]
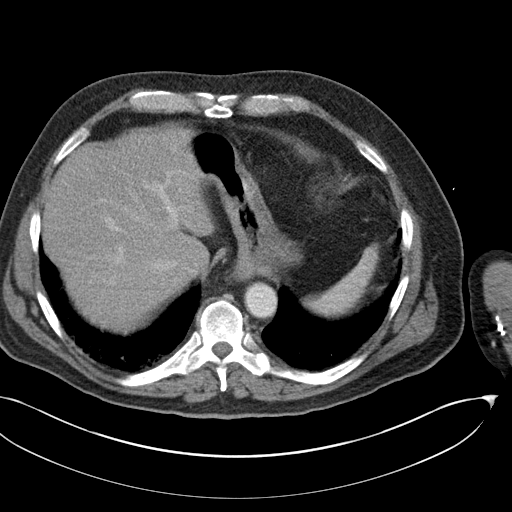
[im 95/103  soft-tissue]
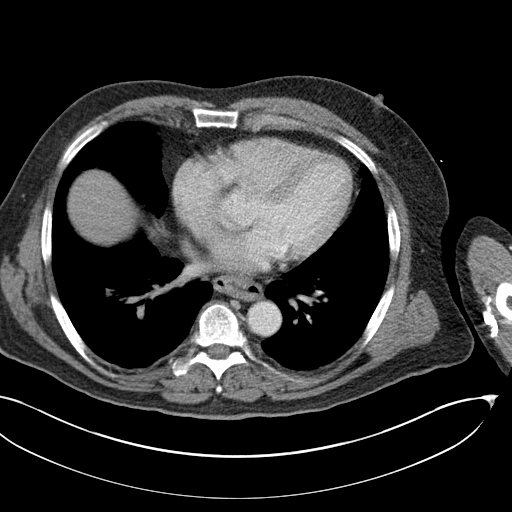

[Series 4: coronal st · coronal · 0.81mm/px · 3 of 158 slices shown]
[im 53/158  soft-tissue]
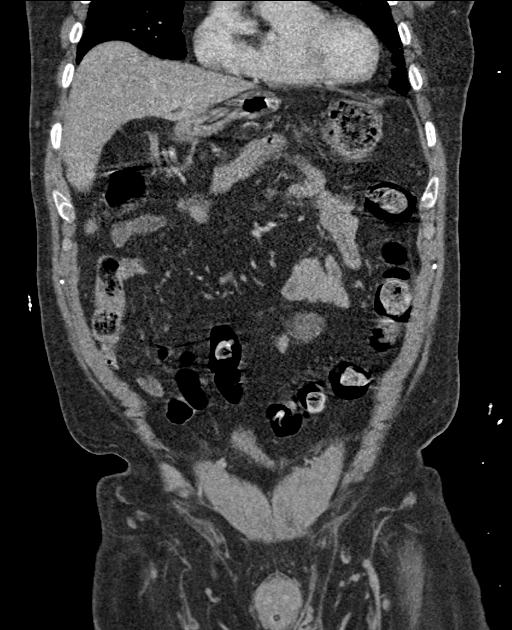
[im 70/158  soft-tissue]
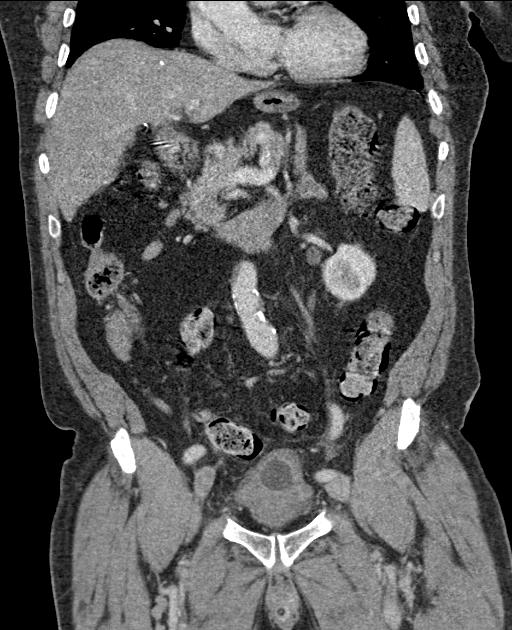
[im 88/158  soft-tissue]
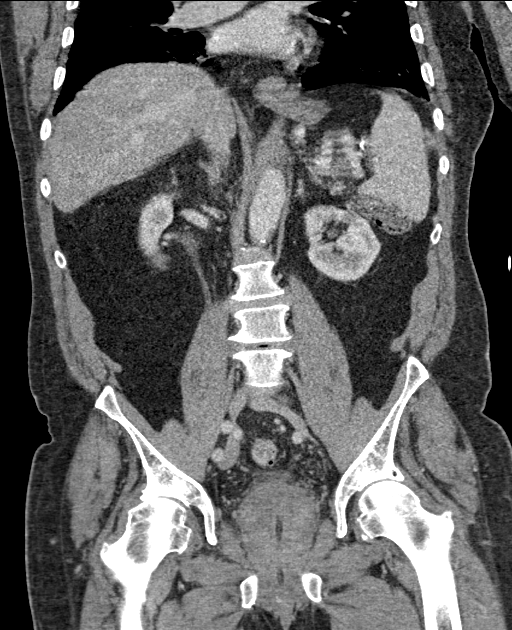

[14 of 46 positions shown; findings below may reference images not displayed]

FINDINGS: Lower chest: Consolidative opacity is seen in the inferior aspect of
the right upper lobe, partially included within the level of
imaging. More diffuse subpleural reticular changes and ground-glass,
may reflect an a combination of interstitial change and atelectasis
given the appearance on this imaging. Stable subpleural 7 mm nodule
in the right middle lobe ([DATE]). A previously seen right upper lobe
perifissural nodule is beyond the margins of imaging. Small 5 mm
subpleural nodule in the right lower lobe is stable ([DATE]). Stable 7
mm nodule in the lingula adjacent the cardiac apex.

Hepatobiliary: Diffuse hepatic hypoattenuation compatible with
hepatic steatosis. No focal liver abnormality is seen. Patient is
post cholecystectomy. No biliary dilatation. No calcified
intraductal gallstones.

Pancreas: No pancreatic ductal dilatation or surrounding
inflammatory changes.

Spleen: Normal in size. No concerning splenic lesions.

Adrenals/Urinary Tract: Normal adrenal glands. Kidneys are normally
located with symmetric enhancement and excretion. Multiple fluid
attenuation cysts are present in both kidneys including a
combination of cortical and parapelvic cysts bilaterally. No
concerning renal mass. Few clustered nonobstructing calculi are
again seen in the lower pole right kidney. No obstructing calculi
are seen. Some mild bilateral ureterectasis is seen with distal
periureteral stranding. Mild symmetric bilateral perinephric
stranding, similar to prior exam, a nonspecific finding. Bladder
decompressed by inflated Foley catheter. Some mild wall thickening
perivesicular hazy stranding is noted.

Stomach/Bowel: Small sliding-type hiatal hernia. Distal stomach and
duodenum are unremarkable. No small bowel thickening or dilatation.
A normal appendix is visualized. No colonic dilatation or wall
thickening. No evidence of obstruction.

Vascular/Lymphatic: Atherosclerotic calcifications within the
abdominal aorta and branch vessels. No aneurysm or ectasia. No
enlarged abdominopelvic lymph nodes.

Reproductive: Enlarged prostate gland, similar to comparison. No
focal lesion or abnormality of the seminal vesicles is seen.
Traversed by the inflated Foley catheter.

Other: No bowel containing hernias. Slightly increased stranding
within a stable sized fat containing umbilical hernia. Some trace
free fluid/inflammatory changes seen along the low anterior pelvis
and left pelvic sidewall, nonspecific, possibly reactive. No free
air.

Musculoskeletal: No acute osseous abnormality or suspicious osseous
lesion. Multilevel degenerative changes are present in the imaged
portions of the spine. Additional degenerative changes in the hips
and pelvis.
IMPRESSION: 1. Consolidative opacity in the inferior aspect of the right upper
lobe, partially included within the level of imaging. Findings are
concerning for pneumonia.
2. Some mild bilateral ureterectasis is seen with distal
periureteral stranding with perivesicular stranding. No obstructing
calculi are seen. Findings could reflect a recently passed stone or
ascending urinary tract infection with some nonspecific bilateral
symmetric stranding as well. Correlate with patient's symptoms and
consider urinalysis.
3. Some trace free fluid/inflammatory changes along the low anterior
pelvis and left pelvic sidewall, nonspecific, possibly reactive.
4. No evidence of bowel obstruction or large colonic stool burden.
5. Enlarged prostate gland, similar to comparison exam.
6. Hepatic steatosis.
7. Aortic Atherosclerosis ([id number]).

## 2021-08-13 IMAGING — US US RENAL
1 series · 14 of 25 positions shown · non-contrast
Comparison: CT Abdomen and Pelvis 06/16/2020, 11/02/2018

CLINICAL DATA: 79-year-old male with acute renal failure.

EXAM:
RENAL / URINARY TRACT ULTRASOUND COMPLETE

[Series 1: us renal · 72 acquisitions, 14 frames shown]
[im 1/72]
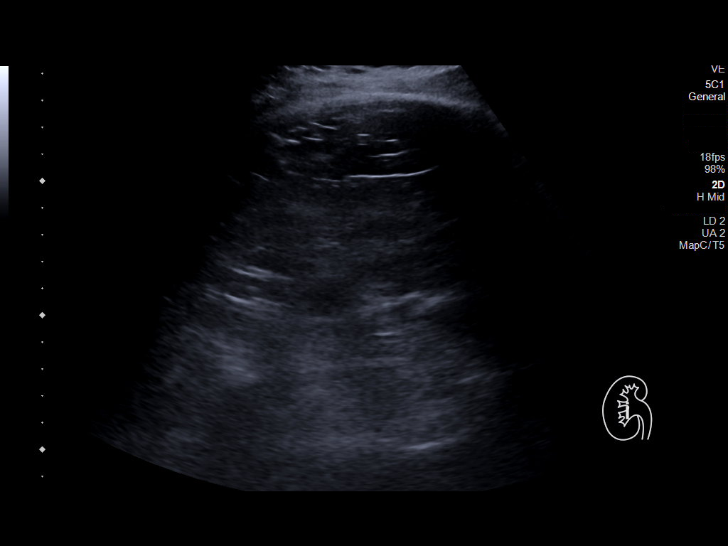
[im 6/72]
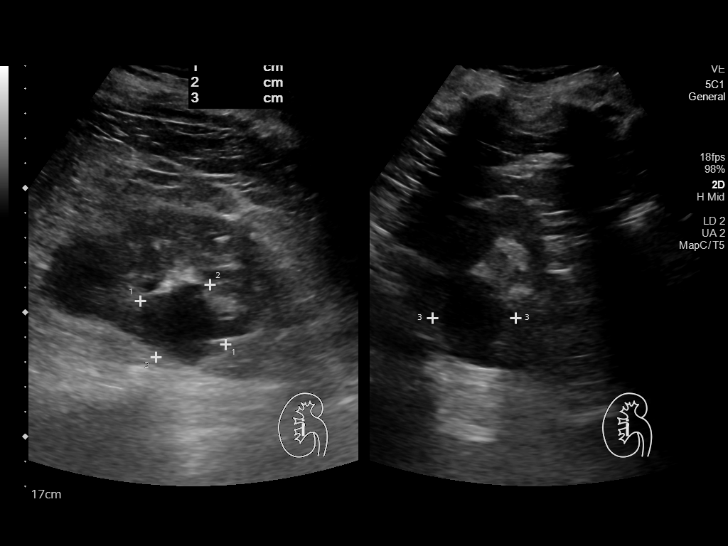
[im 12/72]
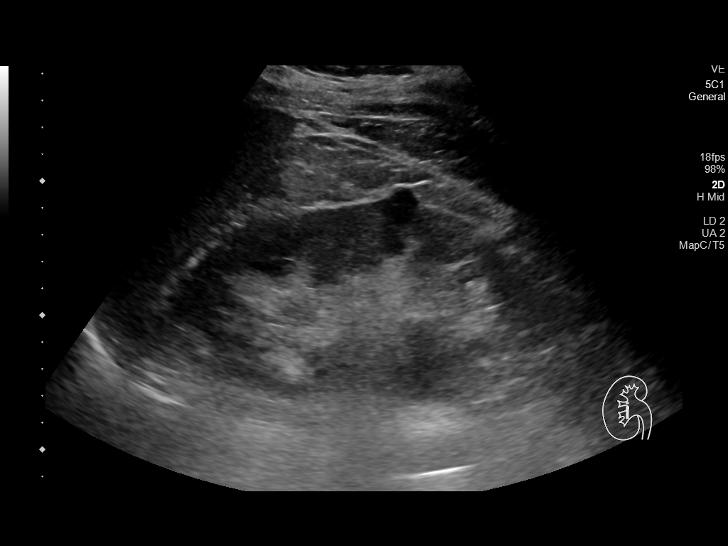
[im 18/72]
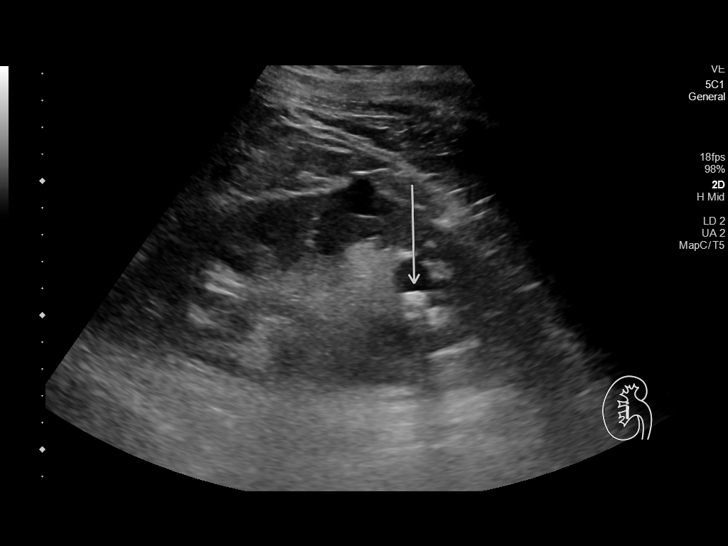
[im 24/72]
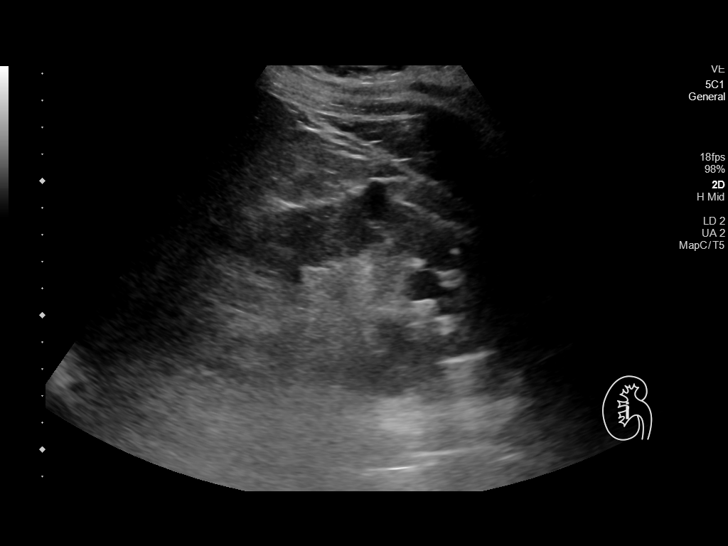
[im 27/72]
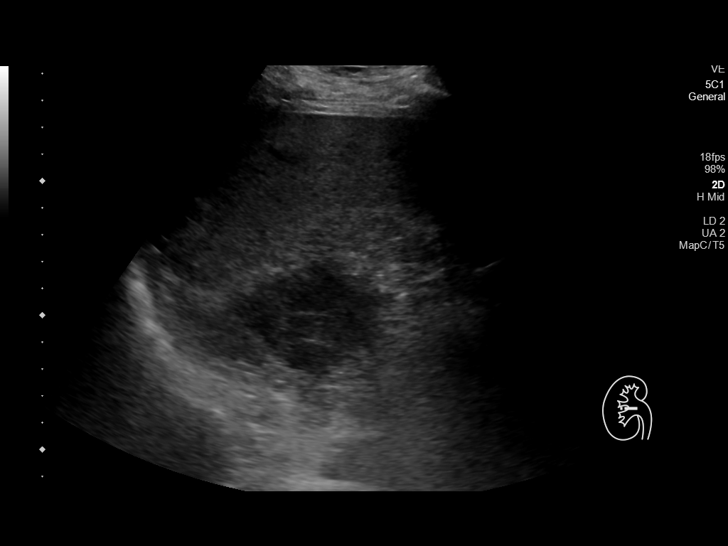
[im 33/72]
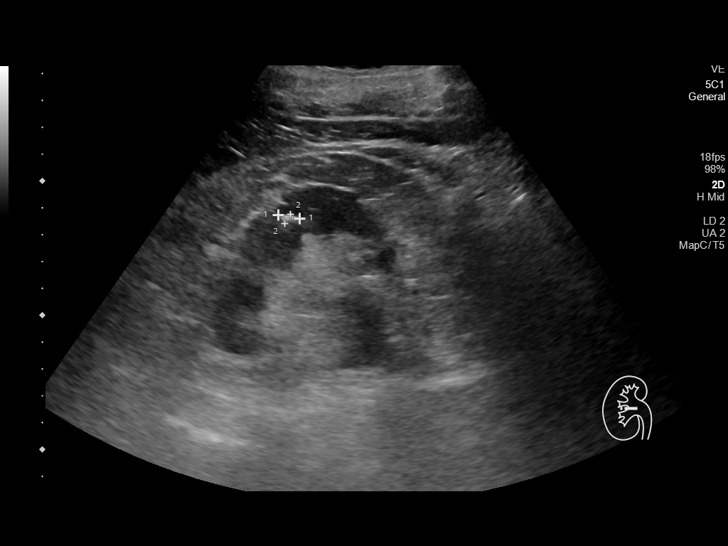
[im 39/72]
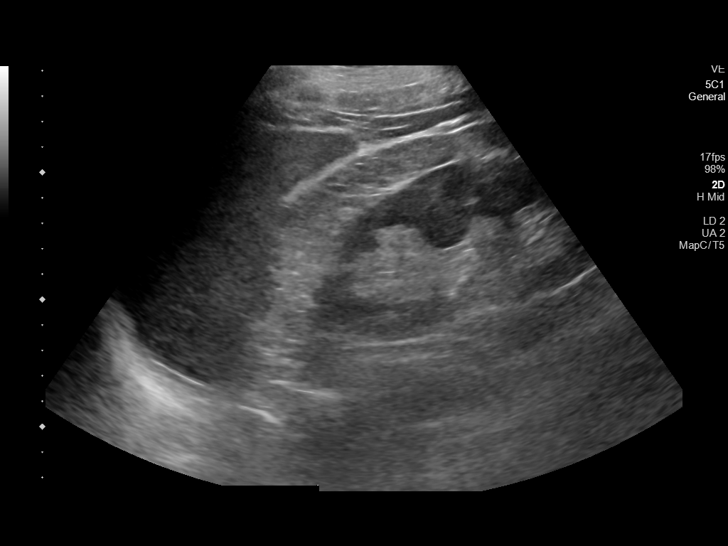
[im 45/72]
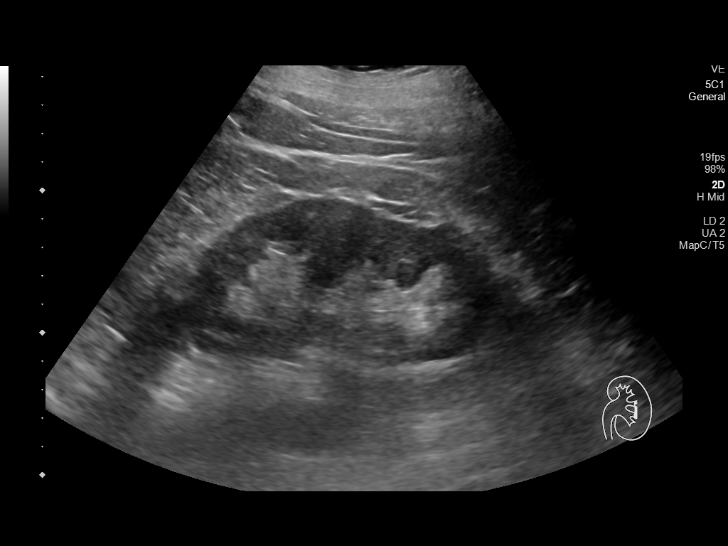
[im 48/72]
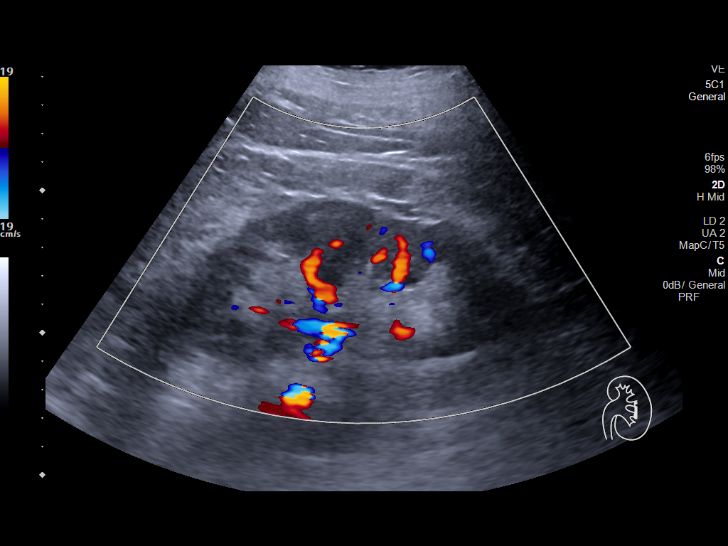
[im 54/72]
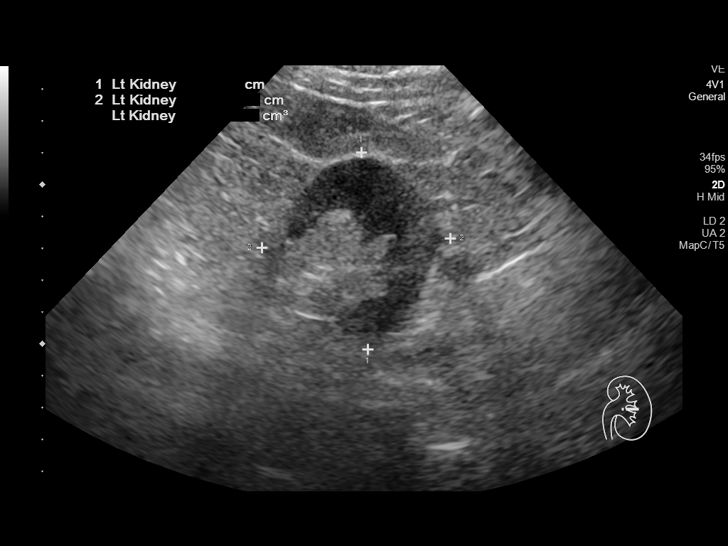
[im 60/72]
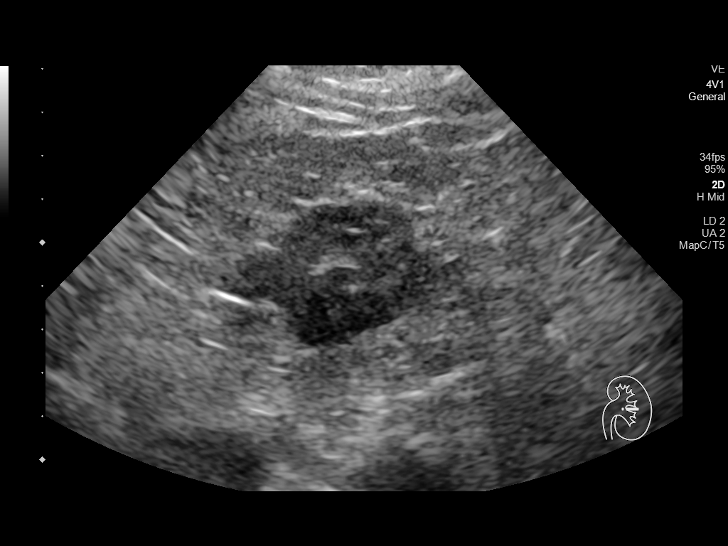
[im 66/72]
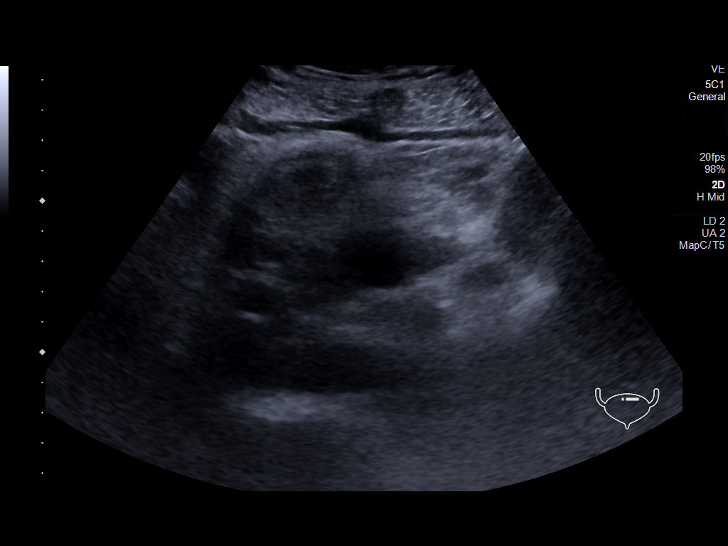
[im 72/72]
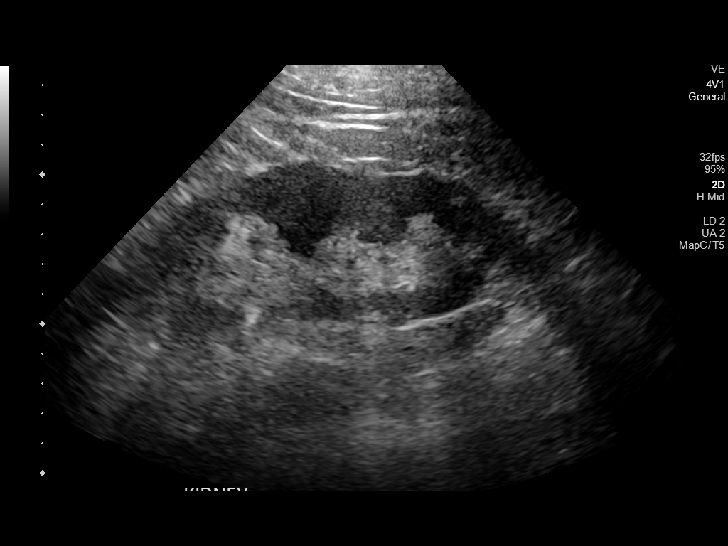

[14 of 25 positions shown; findings below may reference images not displayed]

FINDINGS: Right Kidney:

Renal measurements: 15.2 x 6.7 x 6.8 cm = volume: 365 mL. No right
hydronephrosis. Multiple chronic benign appearing renal cysts (fewer
than 10 cysts) measuring up to 3.9 cm diameter (image 9). Chronic
right lower pole nephrolithiasis redemonstrated (image 18). Cortical
echogenicity within normal limits.

Left Kidney:

Renal measurements: 13.9 x 6.2 x 5.9 cm = volume: 266 mL.
Echogenicity within normal limits. No mass or hydronephrosis
visualized.

Bladder:

Decompressed by a Foley catheter balloon (image 63).

Other:

None.
IMPRESSION: 1. Negative ultrasound appearance of both kidneys aside from right
kidney benign renal cysts and lower pole nephrolithiasis.
2. Bladder decompressed by Foley catheter.

## 2021-08-15 IMAGING — CT CT RENAL STONE PROTOCOL
2 of 3 series · 16 of 46 positions shown, 18 images · non-contrast
Comparison: Ultrasound from 06/27/2020, CT from 06/16/2020

CLINICAL DATA: Flank pain

EXAM:
CT ABDOMEN AND PELVIS WITHOUT CONTRAST
TECHNIQUE: Multidetector CT imaging of the abdomen and pelvis was performed
following the standard protocol without IV contrast.

[Series 3: lung bases · axial · 0.82mm/px · z∈[+1393,+1537]mm · 13 of 84 slices shown, 15 images]
[im 6/84  soft-tissue]
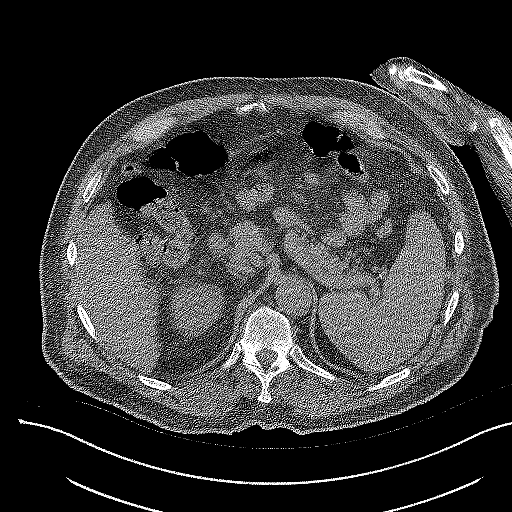
[im 6/84  bone]
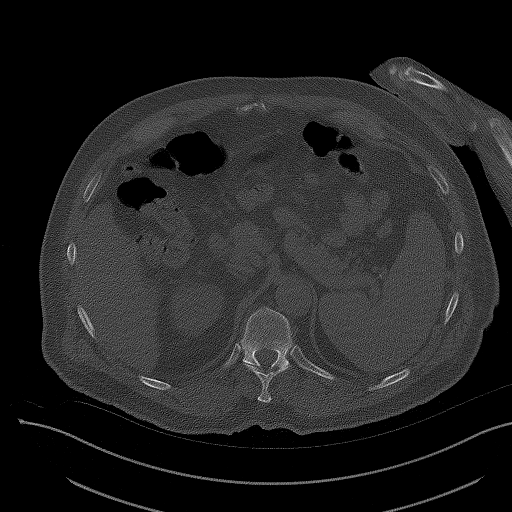
[im 11/84  soft-tissue]
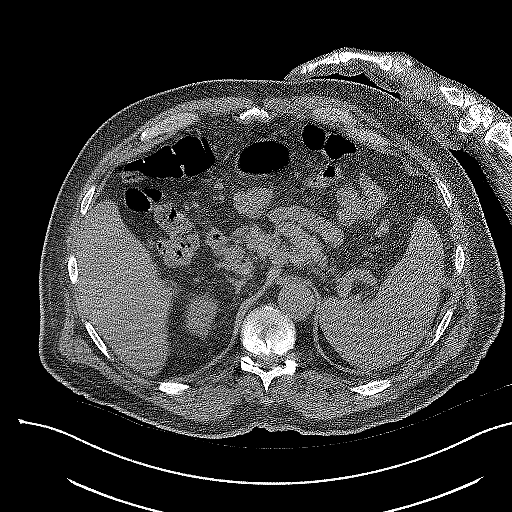
[im 17/84  soft-tissue]
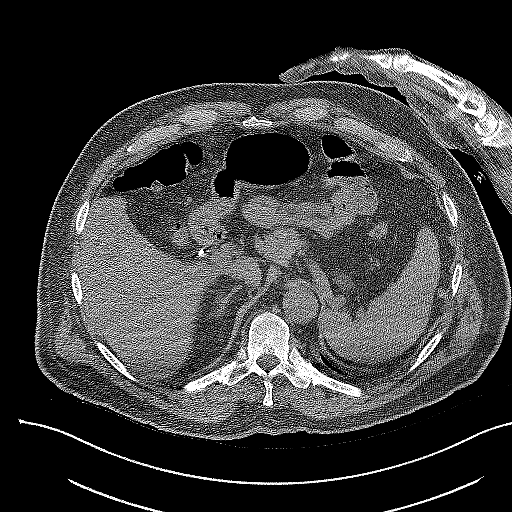
[im 25/84  soft-tissue]
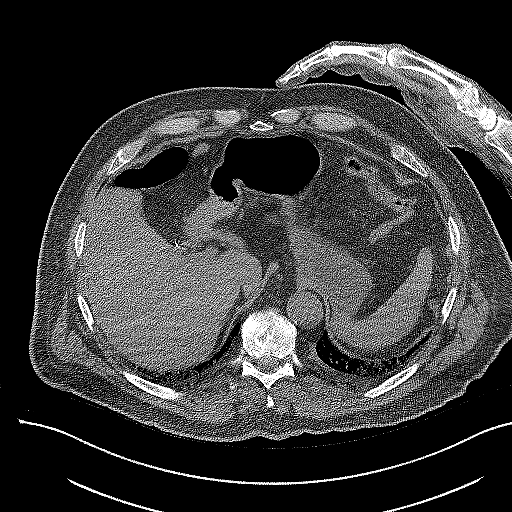
[im 30/84  soft-tissue]
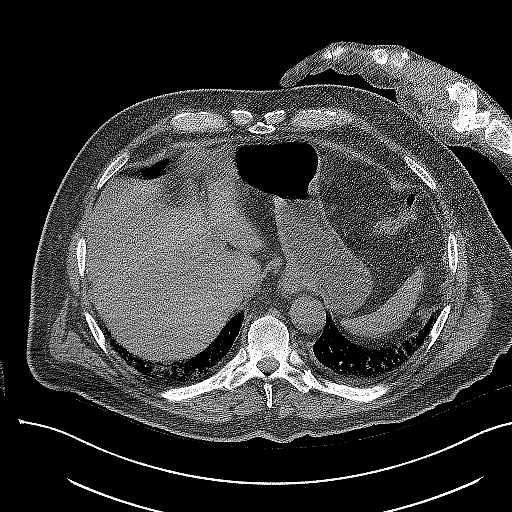
[im 35/84  soft-tissue]
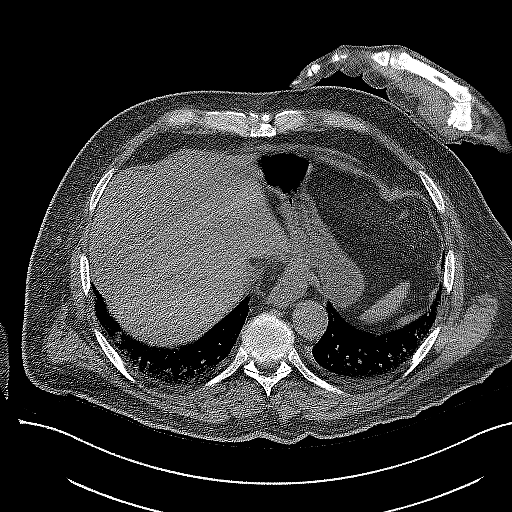
[im 43/84  soft-tissue]
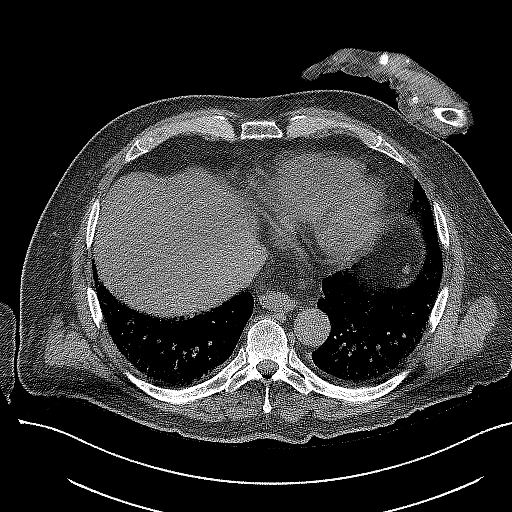
[im 49/84  soft-tissue]
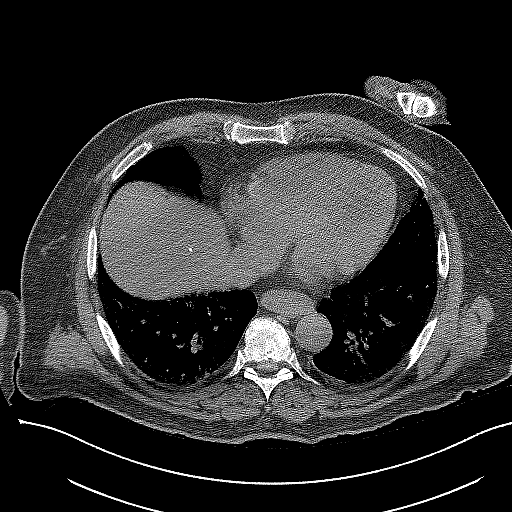
[im 54/84  soft-tissue]
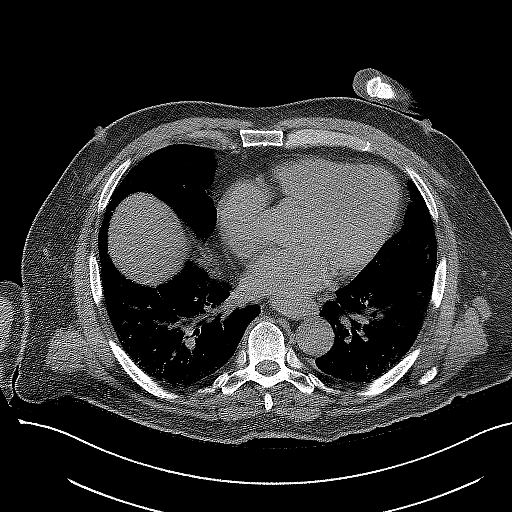
[im 54/84  bone]
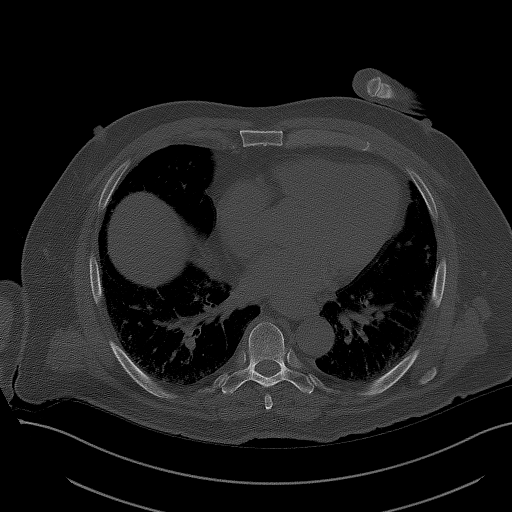
[im 59/84  soft-tissue]
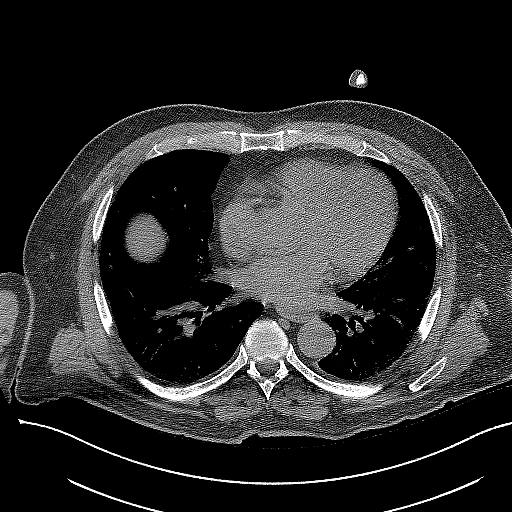
[im 67/84  soft-tissue]
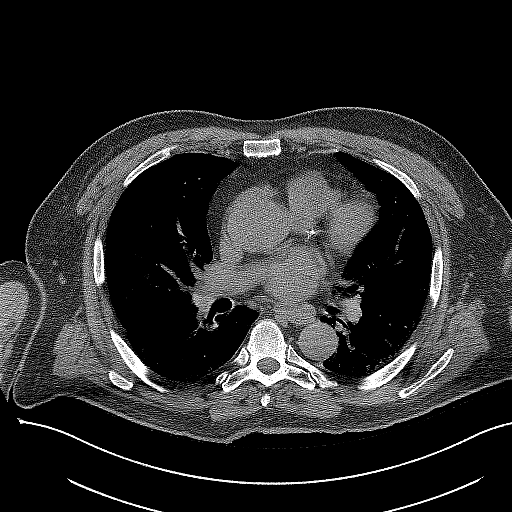
[im 73/84  soft-tissue]
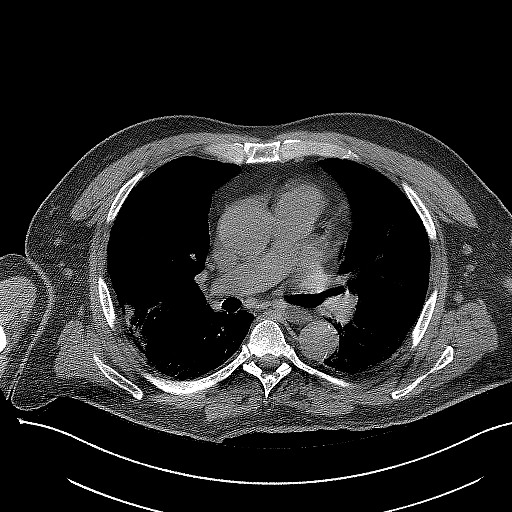
[im 78/84  soft-tissue]
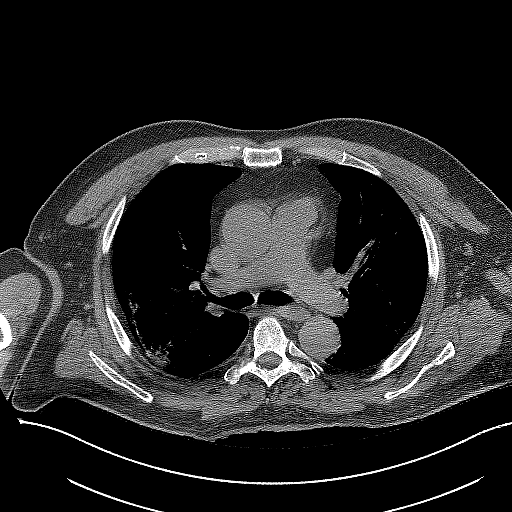

[Series 5: coronal · coronal · 0.78mm/px · 3 of 156 slices shown]
[im 52/156  soft-tissue]
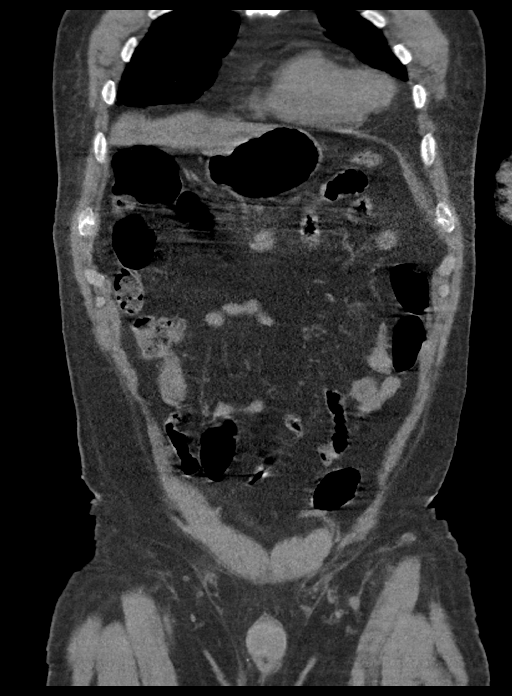
[im 69/156  soft-tissue]
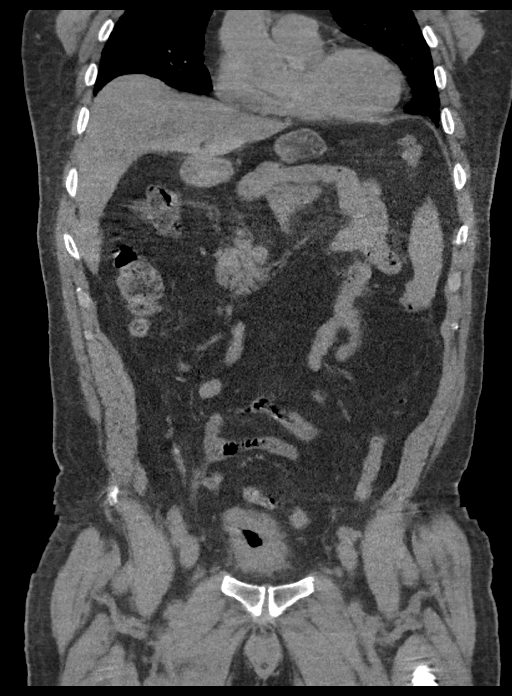
[im 87/156  soft-tissue]
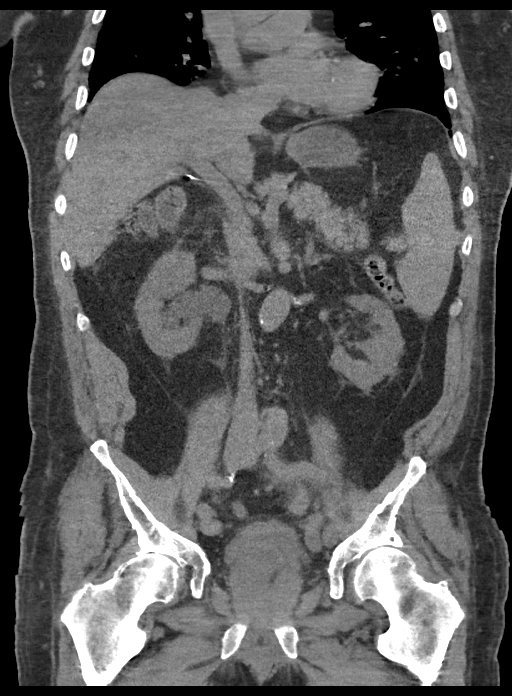

[16 of 46 positions shown; findings below may reference images not displayed]

FINDINGS: Lower chest: Persistent but mildly improved infiltrate in the right
upper lobe. Some stable subpleural nodules are noted on the right as
well. No new focal infiltrate is seen.

Hepatobiliary: No focal liver abnormality is seen. Status post
cholecystectomy. No biliary dilatation.

Pancreas: Unremarkable. No pancreatic ductal dilatation or
surrounding inflammatory changes.

Spleen: Normal in size without focal abnormality.

Adrenals/Urinary Tract: Adrenal glands are within normal limits.
Fullness of the renal collecting system on the right is seen
increased when compared with the prior exam with evidence of
migration of multiple calculi into the proximal right ureter. The
largest of these measures approximately 6-7 mm. Residual stones are
noted in the lower pole of the right kidney similar to that noted on
the prior exam. Renal cystic change is again identified and stable.
Left kidney demonstrates no definitive calculi. The bladder is
decompressed by Foley catheter.

Stomach/Bowel: Appendix is within normal limits. Scattered
diverticular changes noted within the colon without evidence of
diverticulitis. No small bowel abnormality is noted. Hiatal hernia
is noted.

Vascular/Lymphatic: Aortic atherosclerosis. No enlarged abdominal or
pelvic lymph nodes.

Reproductive: Prostate is enlarged.

Other: No abdominal wall hernia or abnormality. No abdominopelvic
ascites.

Musculoskeletal: Mild degenerative changes of lumbar spine are
noted.
IMPRESSION: Migration of several calculi into the proximal right ureter. The
largest of these measures approximately 6-7 mm. They remain lower
pole calculi on the right stable from the prior exam. Mild
hydronephrosis is noted on the right.

Small hiatal hernia.

Diverticulosis without diverticulitis.

Renal cystic changes noted stable from the prior study.
